# Patient Record
Sex: Female | Born: 1960 | Race: White | Hispanic: No | Marital: Married | Smoking: Current every day smoker
Health system: Southern US, Community
[De-identification: ages and names within clinical notes are randomized; demographics above are authoritative.]

## PROBLEM LIST (undated history)

## (undated) DIAGNOSIS — J302 Other seasonal allergic rhinitis: Secondary | ICD-10-CM

## (undated) DIAGNOSIS — Z860100 Personal history of colon polyps, unspecified: Secondary | ICD-10-CM

## (undated) DIAGNOSIS — C801 Malignant (primary) neoplasm, unspecified: Secondary | ICD-10-CM

## (undated) DIAGNOSIS — T7840XA Allergy, unspecified, initial encounter: Secondary | ICD-10-CM

## (undated) DIAGNOSIS — K589 Irritable bowel syndrome without diarrhea: Secondary | ICD-10-CM

## (undated) DIAGNOSIS — F4010 Social phobia, unspecified: Secondary | ICD-10-CM

## (undated) DIAGNOSIS — M1990 Unspecified osteoarthritis, unspecified site: Secondary | ICD-10-CM

## (undated) DIAGNOSIS — G43909 Migraine, unspecified, not intractable, without status migrainosus: Secondary | ICD-10-CM

## (undated) DIAGNOSIS — N959 Unspecified menopausal and perimenopausal disorder: Secondary | ICD-10-CM

## (undated) HISTORY — DX: Unspecified menopausal and perimenopausal disorder: N95.9

## (undated) HISTORY — DX: Unspecified osteoarthritis, unspecified site: M19.90

## (undated) HISTORY — PX: COSMETIC SURGERY: SHX468

## (undated) HISTORY — DX: Migraine, unspecified, not intractable, without status migrainosus: G43.909

## (undated) HISTORY — DX: Personal history of colonic polyps: Z86.010

## (undated) HISTORY — PX: BREAST SURGERY: SHX581

## (undated) HISTORY — DX: Social phobia, unspecified: F40.10

## (undated) HISTORY — DX: Allergy, unspecified, initial encounter: T78.40XA

## (undated) HISTORY — DX: Personal history of colon polyps, unspecified: Z86.0100

## (undated) HISTORY — PX: HEMORROIDECTOMY: SUR656

## (undated) HISTORY — PX: TOTAL ABDOMINAL HYSTERECTOMY: SHX209

---

## 1997-06-28 ENCOUNTER — Other Ambulatory Visit: Admission: RE | Admit: 1997-06-28 | Discharge: 1997-06-28 | Payer: Self-pay | Admitting: Obstetrics and Gynecology

## 1998-04-21 ENCOUNTER — Ambulatory Visit (HOSPITAL_COMMUNITY): Admission: RE | Admit: 1998-04-21 | Discharge: 1998-04-21 | Payer: Self-pay | Admitting: Gastroenterology

## 1999-02-28 ENCOUNTER — Other Ambulatory Visit: Admission: RE | Admit: 1999-02-28 | Discharge: 1999-02-28 | Payer: Self-pay | Admitting: *Deleted

## 1999-11-22 ENCOUNTER — Encounter: Payer: Self-pay | Admitting: *Deleted

## 1999-11-22 ENCOUNTER — Encounter: Admission: RE | Admit: 1999-11-22 | Discharge: 1999-11-22 | Payer: Self-pay | Admitting: *Deleted

## 2000-02-29 ENCOUNTER — Other Ambulatory Visit: Admission: RE | Admit: 2000-02-29 | Discharge: 2000-02-29 | Payer: Self-pay | Admitting: *Deleted

## 2001-04-01 ENCOUNTER — Other Ambulatory Visit: Admission: RE | Admit: 2001-04-01 | Discharge: 2001-04-01 | Payer: Self-pay | Admitting: *Deleted

## 2002-04-29 ENCOUNTER — Other Ambulatory Visit: Admission: RE | Admit: 2002-04-29 | Discharge: 2002-04-29 | Payer: Self-pay | Admitting: Obstetrics and Gynecology

## 2002-06-18 ENCOUNTER — Emergency Department (HOSPITAL_COMMUNITY): Admission: EM | Admit: 2002-06-18 | Discharge: 2002-06-18 | Payer: Self-pay | Admitting: Emergency Medicine

## 2002-07-06 ENCOUNTER — Emergency Department (HOSPITAL_COMMUNITY): Admission: EM | Admit: 2002-07-06 | Discharge: 2002-07-06 | Payer: Self-pay | Admitting: Emergency Medicine

## 2003-10-05 ENCOUNTER — Other Ambulatory Visit: Admission: RE | Admit: 2003-10-05 | Discharge: 2003-10-05 | Payer: Self-pay | Admitting: *Deleted

## 2004-03-23 ENCOUNTER — Ambulatory Visit (HOSPITAL_COMMUNITY): Admission: RE | Admit: 2004-03-23 | Discharge: 2004-03-23 | Payer: Self-pay | Admitting: General Surgery

## 2004-03-23 ENCOUNTER — Encounter (INDEPENDENT_AMBULATORY_CARE_PROVIDER_SITE_OTHER): Payer: Self-pay | Admitting: Specialist

## 2004-09-18 ENCOUNTER — Ambulatory Visit (HOSPITAL_COMMUNITY): Admission: RE | Admit: 2004-09-18 | Discharge: 2004-09-18 | Payer: Self-pay | Admitting: Obstetrics & Gynecology

## 2004-10-17 ENCOUNTER — Inpatient Hospital Stay (HOSPITAL_COMMUNITY): Admission: RE | Admit: 2004-10-17 | Discharge: 2004-10-21 | Payer: Self-pay | Admitting: Obstetrics & Gynecology

## 2004-10-17 ENCOUNTER — Encounter (INDEPENDENT_AMBULATORY_CARE_PROVIDER_SITE_OTHER): Payer: Self-pay | Admitting: Specialist

## 2005-01-21 HISTORY — PX: LAPAROSCOPIC CHOLECYSTECTOMY: SUR755

## 2005-08-20 ENCOUNTER — Encounter: Admission: RE | Admit: 2005-08-20 | Discharge: 2005-08-20 | Payer: Self-pay | Admitting: Gastroenterology

## 2005-09-27 ENCOUNTER — Encounter: Admission: RE | Admit: 2005-09-27 | Discharge: 2005-09-27 | Payer: Self-pay | Admitting: Gastroenterology

## 2005-11-11 ENCOUNTER — Encounter (INDEPENDENT_AMBULATORY_CARE_PROVIDER_SITE_OTHER): Payer: Self-pay | Admitting: Specialist

## 2005-11-11 ENCOUNTER — Ambulatory Visit (HOSPITAL_COMMUNITY): Admission: RE | Admit: 2005-11-11 | Discharge: 2005-11-11 | Payer: Self-pay | Admitting: General Surgery

## 2006-08-26 ENCOUNTER — Ambulatory Visit (HOSPITAL_COMMUNITY): Admission: RE | Admit: 2006-08-26 | Discharge: 2006-08-26 | Payer: Self-pay | Admitting: Obstetrics & Gynecology

## 2007-03-31 ENCOUNTER — Encounter: Admission: RE | Admit: 2007-03-31 | Discharge: 2007-03-31 | Payer: Self-pay | Admitting: Family Medicine

## 2007-12-25 ENCOUNTER — Ambulatory Visit (HOSPITAL_COMMUNITY): Admission: RE | Admit: 2007-12-25 | Discharge: 2007-12-25 | Payer: Self-pay | Admitting: Obstetrics & Gynecology

## 2010-02-11 ENCOUNTER — Encounter: Payer: Self-pay | Admitting: Obstetrics & Gynecology

## 2010-06-08 NOTE — Op Note (Signed)
NAMEDELANA, MANGANELLO              ACCOUNT NO.:  [id number]   MEDICAL RECORD NO.:  [id number]          PATIENT TYPE:  AMB   LOCATION:  DAY                          FACILITY:  [facility]   PHYSICIAN:  Timothy E. Earlene Plater, M.D. DATE OF BIRTH:  May 25, 1960   DATE OF PROCEDURE:  03/23/2004  DATE OF DISCHARGE:                                 OPERATIVE REPORT   PREOPERATIVE DIAGNOSIS:  Prolapsing hemorrhoids with bleeding.   POSTOPERATIVE DIAGNOSIS:  Prolapsing hemorrhoids with bleeding.   PROCEDURE:  Procedure for prolapsing hemorrhoids (PPH) hemorrhoidectomy.   SURGEON:  Timothy E. Earlene Plater, M.D.   ANESTHESIA:  General.   Ms. Waid is 26, otherwise healthy.  After three vaginal deliveries, the  patient has developed continuously problematic hemorrhoids with prolapse and  bleeding.  She was seen and evaluated in the office.  She elects to proceed  with PPH hemorrhoidectomy.  This has been carefully explained.  She was  seen, identified, and the permit signed.   She was taken to the operating room, placed supine.  General endotracheal  anesthesia administered.  She was carefully placed in lithotomy.  Perianal  area was inspected and prepped and draped in the usual fashion.  The  external and the anal orifice were normal in appearance.  The sphincter tone  was satisfactory.  The anus was injected round about for wide epithelial  block with 0.5% Marcaine with epinephrine mixed 25:1 with Wydase.  This  allowed for gentle dilatation as well.  The operating anoscope was placed in  the rectal vault, and the area was inspected.  Hemorrhoids were her only  pathology.   A purse-string suture of 2-0 Prolene was placed at approximately 4-5 cm  proximal to the dentate line, and this was carried out in a circumferential  fashion in the rectal mucosa only.  The scope was removed.  This area was  checked with the examining finger.  It was thought to be good.  A clear  anoscope of the Ethicon kit was placed  into the anal orifice.  The strings  were followed through the center, and again, this purse-string suture was  checked.  Then, with the clear anoscope in place and firmly held, the open  stapling device was introduced into the rectal vault with the head clearly  above the purse-string suture.  The purse-string suture was then tied about  the post.  The strings brought through the body of the stapling device.  Then with the stapler in the proper position and alignment with the proper  traction, the stapler was carefully closed, held for 60 seconds, fired, held  for 60 seconds, and then removed.  The tissue specimen was a complete  circular round of mucosal tissue.  This was sent to pathology.  Meanwhile, a  gauze was placed in the rectal canal and checked for bleeding.  There was  only one spot bleeding, and that was a right anterior hemorrhoidal complex  distal to the suture line.  The suture line was perfectly dry.  This was  checked and rechecked three times over 15 minutes, and there was no bleeding  from the suture line.  I elected to place a suture of 4-0 Vicryl in the  hemorrhoidal complex that was bleeding in the right anterior position.  All  bleeding stopped.  The  wounds were dry.  Gelfoam gauze and dry sterile dressing applied.  All  counts correct.  She tolerated it well.  Was removed to the recovery room in  good condition. Written and verbal instructions were given to her and her  family.  She will be seen and followed as an outpatient.  Percocet 5 mg #36.      TED/MEDQ  D:  03/23/2004  T:  03/23/2004  Job:  [id number]   cc:   Andres Ege, M.D.  [address]  [place]  [place] [address]  Fax: [phone]

## 2010-06-08 NOTE — Op Note (Signed)
Shelly Green, Shelly Green              ACCOUNT NO.:  [id number]   MEDICAL RECORD NO.:  [id number]          PATIENT TYPE:  INP   LOCATION:  9308                          FACILITY:  [facility]   PHYSICIAN:  Roseanna Rainbow, M.D.DATE OF BIRTH:  [birth year]   DATE OF PROCEDURE:  10/17/2004  DATE OF DISCHARGE:                                 OPERATIVE REPORT   PREOPERATIVE DIAGNOSES:  1.  Mild uterine prolapse.  2.  Retroverted uterus.  3.  Dyspareunia.  4.  Dysmenorrhea.   POSTOPERATIVE DIAGNOSES:  1.  Mild uterine prolapse.  2.  Retroverted uterus.  3.  Dyspareunia.  4.  Dysmenorrhea.  5.  Endometriosis.   PROCEDURES:  1.  Diagnostic laparoscopy.  2.  Total abdominal hysterectomy.  3.  Excision of endometriotic peritoneal implant.   SURGEONS:  Roseanna Rainbow, M.D., Bing Neighbors. Clearance Coots, M.D.   ANESTHESIA:  General endotracheal.   COMPLICATIONS:  None.   ESTIMATED BLOOD LOSS:  100 mL.   URINE OUTPUT AND IV FLUID:  As per anesthesiology.   FINDINGS AT LAPAROSCOPY:  There were and bilateral hemorrhagic implants  involving the uterosacral ligament.  There were also adhesions involving the  sigmoid colon to the broad ligament.  There was otherwise no evidence of any  other disease.  The appendix was normal-appearing.  The remainder of the  peritoneal surfaces were normal.  There were minimal adhesions involving the  bladder and the lower uterine segment and cervix.   PROCEDURE:  The patient was taken to the operating room, where general  anesthesia was obtained without difficulty.  She was placed in the dorsal lithotomy position and prepped and draped in  the usual sterile fashion.  A bivalve speculum was then placed in the  patient's vagina and the anterior lip of the of the cervix grasped with a  single-tooth tenaculum.  The Hulka manipulator was then advanced into the  uterus and secured to the anterior lip of the cervix as a means to  manipulate the uterus.  The  speculum and tenaculum were then removed.  Attention was then turned to the patient's abdomen, where a 10 mm skin  incision was made in the umbilical fold.  The fascia was grasped with Kocher  clamps and entered sharply.  The parietal peritoneum was entered sharply as  well.  The Hassan trocar and sleeve were then advanced into the abdomen.  Pneumoperitoneum was obtained with 4 liters of CO2 gas.  A survey of the  patient's pelvis revealed the above findings.  At this point the instruments  were removed from the abdomen.  The incision where the fascial incision was  reapproximated with 0 Vicryl.  The skin was reapproximated with 3-0  Monocryl.  A Pfannenstiel skin incision was then made approximately 2 cm  above the symphysis pubis and extended to the rectus fascia.  The fascia was  incised bilaterally with curved Mayo scissors.  The muscles of the anterior  abdominal wall were then separated in the midline.  The parietal peritoneum  was grasped between two pickups, elevated and entered sharply with the  scalpel.  The pelvis was examined again with the findings noted above.  An Thresa Ross-  O'Sullivan retractor was placed into the incision and the bowel packed away  with moistened laparotomy sponges.  Two long Kellys were placed on the cornu  and used for retraction.  The round ligaments on both sides were transected  with cautery.  The anterior leaf of the broad ligament was incised along the  bladder reflection to the midline from both sides.  The bladder was  dissected off the lower uterine segment and cervix sharply using Metzenbaum  scissors.  The utero-ovarian ligament and fallopian tubes on both sides were  doubly clamped, transected and both free ligatures and suture ligatures were  placed with 0 Vicryl.  Hemostasis was visualized.  The uterine arteries were clamped bilaterally  with parametrial clamps, transected and suture ligated with 0 Vicryl.  Again  hemostasis was assured.  The  uterosacral ligaments were clamped on both  sides, transected and suture ligated in a similar fashion.  The cervix was  then amputated.  The vaginal cuff angles had been secured with suture  ligatures of 0 Vicryl.  The remainder of the cup was closed with interrupted  0 Vicryl figure-of-eight sutures.  Hemostasis was assured.  At this point  the endometriotic lesion described on the right side was grasped and the  surrounding parietal peritoneum was circumscribed with cautery and the  lesion was excised.  Again hemostasis was assured.  The adnexal pedicles were then plicated to the round ligament using 0 Vicryl  bilaterally.  The pelvis was copiously irrigated with warm normal saline.  All laparotomy sponges and instruments were removed from the abdomen.  The  parietal peritoneum was closed with running 2-0 Monocryl.  Hemostasis was  assured.  The fascia was closed with running 0 Vicryl.  The skin was closed  in a subcuticular fashion using 3-0 Monocryl.  Sponge, lap, needle and  instrument counts were correct x2.  The patient was taken to the PACU awake  and in stable condition.      Roseanna Rainbow, M.D.  Electronically Signed     LAJ/MEDQ  D:  10/17/2004  T:  10/17/2004  Job:  [id number]

## 2010-06-08 NOTE — H&P (Signed)
Shelly Green, Shelly Green              ACCOUNT NO.:  [id number]   MEDICAL RECORD NO.:  [id number]          PATIENT TYPE:  INP   LOCATION:  NA                            FACILITY:  [facility]   PHYSICIAN:  Roseanna Rainbow, M.D.DATE OF BIRTH:  Dec 09, 1960   DATE OF ADMISSION:  DATE OF DISCHARGE:                                HISTORY & PHYSICAL   CHIEF COMPLAINT:  The patient is a 50 year old with a retroverted uterus  with mild uterine prolapse and secondary dyspareunia.   HISTORY OF PRESENT ILLNESS:  Please see the above. The patient has a long  history of dyspareunia. The pain is now worsened and unresponsive to  position changes during intercourse.  A recent Hgb was 13.8.   PAST MEDICAL HISTORY:  1.  Irritable bowel syndrome.  2.  Bipolar disorder.   PAST SURGICAL HISTORY:  Hemorrhoidectomy.   SOCIAL HISTORY:  She is married, living with her spouse. Currently smokes.  Does not give any significant history of alcohol usage. Denies illicit drug  use.   PAST GYNECOLOGICAL HISTORY:  Menstrual periods are described as heavy. There  is a 28-day interval between cycles, duration of 4 days. The flow is  described as heavy. Has had moderate dysmenorrhea. Last Pap smear within  normal limits. Contraception:  The partner has had a vasectomy. She has a  remote history of pelvic inflammatory disease. No previous GYN surgery.   PAST OBSTETRICAL HISTORY:  She has three living children.   ALLERGIES:  CODEINE and PENICILLINS   MEDICATIONS:  Alprazolam, Abilify, Cymbalta, butalbital compound/ASA oral  capsule.   PHYSICAL EXAMINATION:  VITAL SIGNS:  Temperature 97.9, pulse 76, blood  pressure 98/59, weight 121 pounds, height 5 feet 3 inches.  GENERAL/CONSTITUTIONAL:  Caucasian female, appears stated age, normal body  habitus.  NECK:  Supple.  LUNGS:  Clear to auscultation bilaterally.  HEART:  Regular rate and rhythm.  ABDOMEN:  Soft, nontender, no organomegaly.  PELVIC:  Normal EG/BUS. On  speculum exam, the vagina is clean. On bimanual  exam, the uterus is normal size. The position is retroverted, no tenderness,  and there is mild uterine prolapse. On adnexal exam, there is no masses,  organomegaly, or local guarding.   ASSESSMENT:  Mild uterine prolapse, retroverted uterus with secondary  dysmenorrhea.   PLAN:  The planned procedure is a diagnostic laparoscopy with a total  vaginal hysterectomy, possible total abdominal hysterectomy. The risks,  benefits, and alternative forms of management were reviewed with the patient  and informed consent has been obtained.   MEDICATIONS:  Alprazolam, Abilify, Cymbalta, butalbital compound/ASA oral  capsule.   ALLERGIES:   ADDENDUM TO HISTORY OF PRESENT ILLNESS:      Roseanna Rainbow, M.D.  Electronically Signed     LAJ/MEDQ  D:  10/16/2004  T:  10/16/2004  Job:  [id number]

## 2010-06-08 NOTE — Op Note (Signed)
Shelly Green, Shelly Green              ACCOUNT NO.:  [id number]   MEDICAL RECORD NO.:  [id number]          PATIENT TYPE:  AMB   LOCATION:  DAY                          FACILITY:  [facility]   PHYSICIAN:  Timothy E. Earlene Plater, M.D. DATE OF BIRTH:  [birth year]   DATE OF PROCEDURE:  11/11/2005  DATE OF DISCHARGE:                                 OPERATIVE REPORT   PREOPERATIVE DIAGNOSIS:  Biliary dyskinesia.   POSTOPERATIVE DIAGNOSIS:  Biliary dyskinesia.   PROCEDURE:  Laparoscopic cholecystectomy and operative cholangiogram.   SURGEON:  Timothy E. Earlene Plater, M.D.   ASSISTANT:  Velora Heckler, MD   ANESTHESIA:  General.   Ms. Goonan is 43 healthy, has been seen and followed recently by Dr. Charna Elizabeth and Dr. Gweneth Dimitri for gastrointestinal complaints compatible with  gallbladder disease.  A complete workup has revealed a normal ultrasound,  normal CT, relatively normal endoscopy and an abnormal hepatobiliary scan.  Because of the patient's worsening symptoms.  She has sought surgical  consultation and wishes to proceed with cholecystectomy as has been  carefully explained.   She was seen, evaluated today and cleared for anesthesia.  She was  identified and the permit signed.   The patient was taken to the operating room and placed supine.  General  endotracheal anesthesia administered.  The abdomen was prepped and draped in  the usual fashion.  The infraumbilical prior incision horizontal was used to  enter the skin after local anesthesia applied.  The fascia identified.  There was a question of a tiny umbilical hernia but on opening the fascia,  there was no hernia present.  The peritoneum was entered without  complication.  A suture of #1 Vicryl placed, a Hasson catheter placed  through the fascia into the peritoneal cavity and tied in place with Vicryl  suture.  The abdomen was insufflated.  General peritoneoscopy was  unremarkable.  The gallbladder was large, normal color.  It was  floppy.  The  body of the gallbladder flopped acutely over the neck of the gallbladder.  There were adhesions of an acute nature between the gallbladder and the  omentum.  A second 10 mL trocar placed in the midepigastrium two 5 mm  trocars in the right upper quadrant.  The gallbladder was grasped, placed on  tension and careful and accurate dissection revealed a normal-appearing  cystic duct and cystic artery.  These were isolated out completely a full  window made posteriorly, no other structures present.  The clip was placed  on the gallbladder side of the cystic duct and cystic duct opened and the  catheter placed percutaneously into the cystic duct remnant.  A real-time  cholangiogram was accomplished with the aid of fluoroscopy and appeared  completely normal with smooth filling of the biliary system and a quick  emptying of dye into the duodenum.  With this the clip and catheter were  removed.  The stump of the cystic duct triply clipped and divided.  The  cystic artery triply clipped and divided and the gallbladder then removed  from the gallbladder bed without incident or complication.  Cautery was used  where necessary.  Copious irrigation was used and the gallbladder bed was  dry.  A piece of Surgicel was placed in the gallbladder bed.   The scope was placed in the epigastric trocar and the gallbladder removed  through the infraumbilical incision without complications.  The incision  closed completely with the previously placed Vicryl suture.  All areas were  examined and there was no bleeding.  All irrigant had been removed.  And  then under direct vision the CO2 trocars and instruments are removed.  Final  counts correct.  Each skin incision carefully inspected and irrigated and  then closed with 3-0 Monocryl and Steri-Strips.  She tolerated it well, was  awake and taken recovery room in good condition.      Timothy E. Earlene Plater, M.D.  Electronically Signed     TED/MEDQ   D:  11/11/2005  T:  11/11/2005  Job:  [id number]   cc:   Pam Drown, M.D.  Fax: [phone]   [id number] LOV FIEP, M.D.  Fax: [phone]

## 2010-06-08 NOTE — Discharge Summary (Signed)
Shelly Green, Shelly Green              ACCOUNT NO.:  [id number]   MEDICAL RECORD NO.:  [id number]          PATIENT TYPE:  INP   LOCATION:  9308                          FACILITY:  [facility]   PHYSICIAN:  Charles A. Clearance Coots, M.D.DATE OF BIRTH:  Dec 15, 1960   DATE OF ADMISSION:  10/17/2004  DATE OF DISCHARGE:  10/21/2004                                 DISCHARGE SUMMARY   ADMISSION DIAGNOSES:  Mild uterine prolapse, retroverted uterus,  dyspareunia, dysmenorrhea.   DISCHARGE DIAGNOSES:  Mild uterine prolapse, retroverted uterus,  dyspareunia, dysmenorrhea, endometriosis.   REASON FOR ADMISSION:  50 year old white female with retroverted uterus,  mild uterine prolapse, secondary dyspareunia.  The patient has a long  history of dyspareunia and pain has now worsened and unresponsive to  position changes.  The menstrual periods are described as heavy, 28 day  interval between cycles, duration of 4 days.  Flow is described as heavy and  she has moderate dysmenorrhea.  Last Pap smear was within normal limits.  She has a remote history of pelvic inflammatory disease.   OBSTETRICAL HISTORY:  Her past obstetrical history is significant for three  living children.   PAST MEDICAL HISTORY:  Illnesses include irritable bowel syndrome, bipolar  disorder.   PAST SURGICAL HISTORY:  Hemorrhoidectomy.   MEDICATIONS:  Patient takes alprazolam, Abilify, Cymbalta, butalbital  compound with aspirin.   ALLERGIES:  PENICILLIN and  CODEINE causes throat swelling.   SOCIAL HISTORY:  Married, living with spouse.  Positive tobacco.  No  significant history of alcohol or recreational drug use.   PHYSICAL EXAMINATION:  GENERAL APPEARANCE: Well-nourished, well-developed  female in no acute distress.  VITAL SIGNS:  Temperature 97.9. Pulse 76, blood pressure 98/59. Weight 121  pounds. Height 5 feet 3 inches.  NECK:  Supple.  LUNGS:  Clear to auscultation bilaterally.  HEART:  Regular rate and rhythm.  ABDOMEN:   Soft, nontender, no organomegaly.  PELVIC:  Normal external female genitalia.  Vaginal mucosa is clean.  Bimanual examination revealed the uterus to be normal size, retroverted, no  tenderness, mild uterine prolapse.  Adnexa negative for masses or  tenderness.   ASSESSMENT:  Mild uterine prolapse with retroverted uterus and secondary  dysmenorrhea.   PLAN:  Diagnostic laparoscopy, vaginal hysterectomy.   HOSPITAL COURSE:  The patient underwent a diagnostic laparoscopy and there  were bilateral hemorrhagic implants involving the uterosacral ligament.  There were also adhesions involving the sigmoid colon to the broad ligament.  The appendix was normal and there was no other disease seen.  There were  minimal adhesions involving the bladder and the lower uterine segment and  cervix.  The decision was made to proceed with total abdominal hysterectomy.  A total abdominal hysterectomy was performed without complications.  Postoperative course was uncomplicated.  The patient had a mild adynamic  ileus which resolved by postoperative day #3 and the patient was able to  tolerate solid foods on postoperative day #3 and was discharged home on  postoperative day #4 in good condition.   LABORATORY DATA:  Admitting and discharge laboratory values with admission  hemoglobin 13.1, hematocrit 39.8, white  blood cell count 8,600, platelet  count 192,000.  Discharge labs include hemoglobin 9.9, hematocrit 29.8.   DISPOSITION:   DISCHARGE MEDICATIONS:  Ultram was prescribed for pain.  Continue usual  medications.   FOLLOW UP:  Patient is to follow up in the office in two weeks.   DISCHARGE INSTRUCTIONS:  Routine written discharge instructions were given  for discharge after hysterectomy.      Charles A. Clearance Coots, M.D.  Electronically Signed     CAH/MEDQ  D:  10/22/2004  T:  10/22/2004  Job:  [id number]

## 2011-08-15 ENCOUNTER — Other Ambulatory Visit: Payer: Self-pay | Admitting: Orthopedic Surgery

## 2011-08-19 ENCOUNTER — Encounter (HOSPITAL_BASED_OUTPATIENT_CLINIC_OR_DEPARTMENT_OTHER): Payer: Self-pay | Admitting: *Deleted

## 2011-08-19 NOTE — H&P (Signed)
  Shelly Green is an 51 y.o. female.   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  She has been evaluated at [facility] and sports medicine for evaluation of hand symptoms.  She had tendonitis symptoms at her first dorsal compartment on her right. She had an injection performed by Hurshel Keys in the fall of 2012.  She improved after splinting.  She subsequently developed numbness in her median innervated fingers, right worse than left.  Pete injected her right ulnar bursa at least one time and had her use night splints.  She had electrodiagnostic studies performed by Dr. Regino Schultze on 02-28-2011.  These revealed evidence of significant right carpal tunnel syndrome.  The left hand was not tested at that time.     Past Medical History  Diagnosis Date  . IBS (irritable bowel syndrome)   . Seasonal allergies   . Bipolar 1 disorder     Past Surgical History  Procedure Date  . Laparoscopic cholecystectomy 2007  . Total abdominal hysterectomy   . Hemorroidectomy     No family history on file. Social History:  reports that she has been smoking.  She does not have any smokeless tobacco history on file. She reports that she does not drink alcohol. Her drug history not on file.  Allergies:  Allergies  Allergen Reactions  . Codeine Anaphylaxis  . Hydrocodone Shortness Of Breath  . Penicillins Anaphylaxis  . Tramadol Shortness Of Breath    No prescriptions prior to admission    No results found for this or any previous visit (from the past 48 hour(s)).  No results found.   Pertinent items are noted in HPI.  Height 5\' 2"  (1.575 m), weight 57.153 kg (126 lb).  General appearance: alert Head: Normocephalic, without obvious abnormality Neck: supple, symmetrical, trachea midline Resp: clear to auscultation bilaterally Cardio: regular rate and rhythm GI: normal findings: bowel  sounds normal Extremities.  Inspection of her hands reveals normal sweat patterns and dermatoglyphics. She has no thenar atrophy. She has positive wrist flexion test on the right and positive Tinel's sign on the right. She has positive direct compression test bilaterally.  She has no sign of stenosing tenosynovitis of her fingers or thumb, but does have a mildly painful Finkelstein maneuver on the right.  She does not show signs of thumb CMC arthrosis.   Plain x-rays AP and lateral including the carpus of her right and left hands are obtained.  She has no significant osteoarthritis. Her carpus is normal in appearance bilaterally.  Pulses: 2+ and symmetric Skin: normal Neurologic: Grossly normal    Assessment/Plan Impression:Right CTS  Plan: To the OR for right CTR.The procedure, risks,benefits and post-op course were discussed with the patient at length and they were in agreement with the plan. .  If, at the time of surgery, she has significant stenosing tenosynovitis symptoms at the first dorsal compartment we will also release the right first dorsal compartment.     Shelly Green 08/19/2011, 9:55 PM      H&P documentation: 08/20/2011  -History and Physical Reviewed  -Patient has been re-examined  -No change in the plan of care  Shelly Forster, MD

## 2011-08-19 NOTE — Progress Notes (Signed)
No labs needed

## 2011-08-20 ENCOUNTER — Encounter (HOSPITAL_BASED_OUTPATIENT_CLINIC_OR_DEPARTMENT_OTHER): Payer: Self-pay | Admitting: *Deleted

## 2011-08-20 ENCOUNTER — Encounter (HOSPITAL_BASED_OUTPATIENT_CLINIC_OR_DEPARTMENT_OTHER): Payer: Self-pay | Admitting: Anesthesiology

## 2011-08-20 ENCOUNTER — Ambulatory Visit (HOSPITAL_BASED_OUTPATIENT_CLINIC_OR_DEPARTMENT_OTHER)
Admission: RE | Admit: 2011-08-20 | Discharge: 2011-08-20 | Disposition: A | Discharge status: Home / Self Care | Payer: BC Managed Care – PPO | Source: Ambulatory Visit | Attending: Orthopedic Surgery | Admitting: Orthopedic Surgery

## 2011-08-20 ENCOUNTER — Encounter (HOSPITAL_BASED_OUTPATIENT_CLINIC_OR_DEPARTMENT_OTHER)
Admission: RE | Disposition: A | Discharge status: Home / Self Care | Payer: Self-pay | Source: Ambulatory Visit | Attending: Orthopedic Surgery

## 2011-08-20 ENCOUNTER — Ambulatory Visit (HOSPITAL_BASED_OUTPATIENT_CLINIC_OR_DEPARTMENT_OTHER): Payer: BC Managed Care – PPO | Admitting: Anesthesiology

## 2011-08-20 DIAGNOSIS — G5600 Carpal tunnel syndrome, unspecified upper limb: Secondary | ICD-10-CM | POA: Insufficient documentation

## 2011-08-20 HISTORY — DX: Irritable bowel syndrome, unspecified: K58.9

## 2011-08-20 HISTORY — DX: Other seasonal allergic rhinitis: J30.2

## 2011-08-20 HISTORY — PX: CARPAL TUNNEL RELEASE: SHX101

## 2011-08-20 SURGERY — CARPAL TUNNEL RELEASE
Anesthesia: General | Site: Hand | Laterality: Right | Wound class: Clean

## 2011-08-20 MED ORDER — PROPOFOL 10 MG/ML IV EMUL
INTRAVENOUS | Status: DC | PRN
  Administered 2011-08-20: 150 mg via INTRAVENOUS

## 2011-08-20 MED ORDER — DEXAMETHASONE SODIUM PHOSPHATE 10 MG/ML IJ SOLN
INTRAMUSCULAR | Status: DC | PRN
  Administered 2011-08-20: 10 mg via INTRAVENOUS

## 2011-08-20 MED ORDER — ONDANSETRON HCL 4 MG/2ML IJ SOLN
INTRAMUSCULAR | Status: DC | PRN
  Administered 2011-08-20: 4 mg via INTRAVENOUS

## 2011-08-20 MED ORDER — MIDAZOLAM HCL 5 MG/5ML IJ SOLN
INTRAMUSCULAR | Status: DC | PRN
  Administered 2011-08-20: 2 mg via INTRAVENOUS

## 2011-08-20 MED ORDER — LACTATED RINGERS IV SOLN
INTRAVENOUS | Status: DC
  Administered 2011-08-20: 10 mL/h via INTRAVENOUS
  Administered 2011-08-20: 09:00:00 via INTRAVENOUS

## 2011-08-20 MED ORDER — ONDANSETRON HCL 4 MG/2ML IJ SOLN: 4.0000 mg | Freq: Once | INTRAMUSCULAR | Status: DC | PRN

## 2011-08-20 MED ORDER — HYDROMORPHONE HCL 2 MG PO TABS: ORAL_TABLET | ORAL | Status: AC

## 2011-08-20 MED ORDER — HYDROMORPHONE HCL PF 1 MG/ML IJ SOLN
0.2500 mg | INTRAMUSCULAR | Status: DC | PRN
  Administered 2011-08-20: 0.5 mg via INTRAVENOUS

## 2011-08-20 MED ORDER — LIDOCAINE HCL (CARDIAC) 20 MG/ML IV SOLN
INTRAVENOUS | Status: DC | PRN
  Administered 2011-08-20: 100 mg via INTRAVENOUS

## 2011-08-20 MED ORDER — CHLORHEXIDINE GLUCONATE 4 % EX LIQD: 60.0000 mL | Freq: Once | CUTANEOUS | Status: DC

## 2011-08-20 MED ORDER — FENTANYL CITRATE 0.05 MG/ML IJ SOLN
INTRAMUSCULAR | Status: DC | PRN
  Administered 2011-08-20: 100 ug via INTRAVENOUS

## 2011-08-20 MED ORDER — LIDOCAINE HCL 2 % IJ SOLN
INTRAMUSCULAR | Status: DC | PRN
  Administered 2011-08-20: 4 mL

## 2011-08-20 SURGICAL SUPPLY — 38 items
BANDAGE ADHESIVE 1X3 (GAUZE/BANDAGES/DRESSINGS) IMPLANT
BANDAGE ELASTIC 3 VELCRO ST LF (GAUZE/BANDAGES/DRESSINGS) ×2 IMPLANT
BLADE SURG 15 STRL LF DISP TIS (BLADE) ×1 IMPLANT
BLADE SURG 15 STRL SS (BLADE) ×1
BNDG ESMARK 4X9 LF (GAUZE/BANDAGES/DRESSINGS) IMPLANT
BRUSH SCRUB EZ PLAIN DRY (MISCELLANEOUS) ×2 IMPLANT
CLOTH BEACON ORANGE TIMEOUT ST (SAFETY) ×2 IMPLANT
CORDS BIPOLAR (ELECTRODE) IMPLANT
COVER MAYO STAND STRL (DRAPES) ×2 IMPLANT
COVER TABLE BACK 60X90 (DRAPES) ×2 IMPLANT
CUFF TOURNIQUET SINGLE 18IN (TOURNIQUET CUFF) IMPLANT
DECANTER SPIKE VIAL GLASS SM (MISCELLANEOUS) IMPLANT
DRAPE EXTREMITY T 121X128X90 (DRAPE) ×2 IMPLANT
DRAPE SURG 17X23 STRL (DRAPES) ×2 IMPLANT
GLOVE BIO SURGEON STRL SZ 6.5 (GLOVE) ×4 IMPLANT
GLOVE BIOGEL M STRL SZ7.5 (GLOVE) ×2 IMPLANT
GLOVE BIOGEL PI IND STRL 6.5 (GLOVE) ×1 IMPLANT
GLOVE BIOGEL PI INDICATOR 6.5 (GLOVE) ×1
GLOVE ORTHO TXT STRL SZ7.5 (GLOVE) ×2 IMPLANT
GOWN PREVENTION PLUS XLARGE (GOWN DISPOSABLE) ×6 IMPLANT
GOWN PREVENTION PLUS XXLARGE (GOWN DISPOSABLE) ×4 IMPLANT
NEEDLE 27GAX1X1/2 (NEEDLE) IMPLANT
PACK BASIN DAY SURGERY FS (CUSTOM PROCEDURE TRAY) ×2 IMPLANT
PAD CAST 3X4 CTTN HI CHSV (CAST SUPPLIES) ×1 IMPLANT
PADDING CAST ABS 4INX4YD NS (CAST SUPPLIES) ×1
PADDING CAST ABS COTTON 4X4 ST (CAST SUPPLIES) ×1 IMPLANT
PADDING CAST COTTON 3X4 STRL (CAST SUPPLIES) ×1
SPLINT PLASTER CAST XFAST 3X15 (CAST SUPPLIES) ×5 IMPLANT
SPLINT PLASTER XTRA FASTSET 3X (CAST SUPPLIES) ×5
SPONGE GAUZE 4X4 12PLY (GAUZE/BANDAGES/DRESSINGS) ×2 IMPLANT
STOCKINETTE 4X48 STRL (DRAPES) ×2 IMPLANT
STRIP CLOSURE SKIN 1/2X4 (GAUZE/BANDAGES/DRESSINGS) ×2 IMPLANT
SUT PROLENE 3 0 PS 2 (SUTURE) ×2 IMPLANT
SYR 3ML 23GX1 SAFETY (SYRINGE) IMPLANT
SYR CONTROL 10ML LL (SYRINGE) IMPLANT
TRAY DSU PREP LF (CUSTOM PROCEDURE TRAY) ×2 IMPLANT
UNDERPAD 30X30 INCONTINENT (UNDERPADS AND DIAPERS) ×2 IMPLANT
WATER STERILE IRR 1000ML POUR (IV SOLUTION) ×2 IMPLANT

## 2011-08-20 NOTE — Anesthesia Procedure Notes (Signed)
Procedure Name: LMA Insertion Date/Time: 08/20/2011 9:46 AM Performed by: Burna Cash Pre-anesthesia Checklist: Patient identified, Emergency Drugs available, Suction available and Patient being monitored Patient Re-evaluated:Patient Re-evaluated prior to inductionOxygen Delivery Method: Circle System Utilized Preoxygenation: Pre-oxygenation with 100% oxygen Intubation Type: IV induction Ventilation: Mask ventilation without difficulty LMA: LMA inserted LMA Size: 4.0 Number of attempts: 1 Airway Equipment and Method: bite block Placement Confirmation: positive ETCO2 Tube secured with: Tape Dental Injury: Teeth and Oropharynx as per pre-operative assessment

## 2011-08-20 NOTE — Transfer of Care (Signed)
Immediate Anesthesia Transfer of Care Note  Patient: Shelly Green  Procedure(s) Performed: Procedure(s) (LRB): CARPAL TUNNEL RELEASE (Right)  Patient Location: PACU  Anesthesia Type: General  Level of Consciousness: sedated  Airway & Oxygen Therapy: Patient Spontanous Breathing and Patient connected to face mask oxygen  Post-op Assessment: Report given to PACU RN and Post -op Vital signs reviewed and stable  Post vital signs: Reviewed and stable  Complications: No apparent anesthesia complications

## 2011-08-20 NOTE — Anesthesia Postprocedure Evaluation (Signed)
Anesthesia Post Note  Patient: Shelly Green  Procedure(s) Performed: Procedure(s) (LRB): CARPAL TUNNEL RELEASE (Right)  Anesthesia type: general  Patient location: PACU  Post pain: Pain level controlled  Post assessment: Patient's Cardiovascular Status Stable  Last Vitals:  Filed Vitals:   08/20/11 1030  BP: 132/71  Pulse: 57  Temp:   Resp: 13    Post vital signs: Reviewed and stable  Level of consciousness: sedated  Complications: No apparent anesthesia complications  

## 2011-08-20 NOTE — Op Note (Signed)
[id number]  

## 2011-08-20 NOTE — Anesthesia Postprocedure Evaluation (Signed)
Anesthesia Post Note  Patient: Shelly Green  Procedure(s) Performed: Procedure(s) (LRB): CARPAL TUNNEL RELEASE (Right)  Anesthesia type: general  Patient location: PACU  Post pain: Pain level controlled  Post assessment: Patient's Cardiovascular Status Stable  Last Vitals:  Filed Vitals:   08/20/11 1030  BP: 132/71  Pulse: 57  Temp:   Resp: 13    Post vital signs: Reviewed and stable  Level of consciousness: sedated  Complications: No apparent anesthesia complications

## 2011-08-20 NOTE — Anesthesia Preprocedure Evaluation (Signed)
Anesthesia Evaluation  Patient identified by MRN, date of birth, ID band Patient awake    Reviewed: Allergy & Precautions, H&P , NPO status , Patient's Chart, lab work & pertinent test results  Airway Mallampati: I TM Distance: >3 FB Neck ROM: Full    Dental   Pulmonary          Cardiovascular     Neuro/Psych Bipolar Disorder    GI/Hepatic   Endo/Other    Renal/GU      Musculoskeletal   Abdominal   Peds  Hematology   Anesthesia Other Findings   Reproductive/Obstetrics                           Anesthesia Physical Anesthesia Plan  ASA: II  Anesthesia Plan: General   Post-op Pain Management:    Induction: Intravenous  Airway Management Planned: LMA  Additional Equipment:   Intra-op Plan:   Post-operative Plan: Extubation in OR  Informed Consent: I have reviewed the patients History and Physical, chart, labs and discussed the procedure including the risks, benefits and alternatives for the proposed anesthesia with the patient or authorized representative who has indicated his/her understanding and acceptance.     Plan Discussed with: CRNA and Surgeon  Anesthesia Plan Comments:         Anesthesia Quick Evaluation

## 2011-08-20 NOTE — Brief Op Note (Signed)
08/20/2011  10:10 AM  PATIENT:  Shelly Green  51 y.o. female  PRE-OPERATIVE DIAGNOSIS:  right carpal tunnel syndrome  POST-OPERATIVE DIAGNOSIS:  right carpal tunnel syndrome  PROCEDURE:  Procedure(s) (LRB): CARPAL TUNNEL RELEASE (Right)  SURGEON:  Surgeon(s) and Role:    * Wyn Forster., MD - Primary  PHYSICIAN ASSISTANT:   ASSISTANTS: Mallory Shirk.A-C   ANESTHESIA:   General by LMA  EBL:  Total I/O In: 100 [I.V.:100] Out: -   BLOOD ADMINISTERED:none  DRAINS: none   LOCAL MEDICATIONS USED:  XYLOCAINE   SPECIMEN:  No Specimen  DISPOSITION OF SPECIMEN:  N/A  COUNTS:  YES  TOURNIQUET:   Total Tourniquet Time Documented: Upper Arm (Right) - 7 minutes  DICTATION: .Other Dictation: Dictation Number [id number]  PLAN OF CARE: Discharge to home after PACU  PATIENT DISPOSITION:  PACU - hemodynamically stable.

## 2011-08-21 ENCOUNTER — Encounter (HOSPITAL_BASED_OUTPATIENT_CLINIC_OR_DEPARTMENT_OTHER): Payer: Self-pay | Admitting: Orthopedic Surgery

## 2011-08-21 NOTE — Op Note (Signed)
NAMEKELSY, Shelly Green              ACCOUNT NO.:  [id number]  MEDICAL RECORD NO.:  [id number]  LOCATION:                                 FACILITY:  PHYSICIAN:  Katy Fitch. Burma Ketcher, M.D.      DATE OF BIRTH:  DATE OF PROCEDURE:  08/20/2011 DATE OF DISCHARGE:                              OPERATIVE REPORT   PREOPERATIVE DIAGNOSIS:  Entrapped neuropathy, median nerve, right carpal tunnel.  POSTOPERATIVE DIAGNOSIS:  Entrapped neuropathy, median nerve, right carpal tunnel.  OPERATION:  Release of right transverse carpal ligament.  OPERATING SURGEON:  Katy Fitch. Khadijah Mastrianni, MD  ASSISTANT:  Marveen Reeks. Dasnoit, PA-C.  ANESTHESIA:  General by LMA.  SUPERVISING ANESTHESIOLOGIST:  Kaylyn Layer. Michelle Piper, M.D.  INDICATIONS:  Shelly Green is a 51 year old woman referred through the courtesy of Dr. Gweneth Dimitri, for evaluation and management of significant bilateral hand numbness.  Clinical examination suggested probable carpal tunnel syndrome.  Prior electrodiagnostic studies completed by Dr. Regino Schultze of [facility] demonstrated bilateral carpal tunnel syndrome.  Due to failure to respond to nonoperative measures including splinting, activity, modification, and injection, she was brought to the operating room at this time for release of her right transverse carpal ligament.  PROCEDURE IN DETAIL:  Shelly Green was brought to room #2 of the [facility] and placed supine position on the operating table.  Following a detailed anesthesia informed consent by Dr. Michelle Piper, general anesthesia by LMA technique was recommended and accepted.  In room 2, under Dr. Deirdre Priest direct supervision, general anesthesia by LMA technique was induced followed by routine Betadine scrub and paint of the right upper extremity.  Following exsanguination right arm with Esmarch bandage, arterial tourniquet was inflated to 250 mmHg due to mild systolic hypertension.  Following routine surgical  time-out, the procedure commenced with short incision in the line of the ring finger in the palm.  Subcutaneous tissues were carefully divided revealing palmar fascia.  This split longitudinally to reveal the common sensory branch of the median nerve.  These were followed back to the transcarpal ligament, was gently isolated from the median nerve with a Insurance risk surveyor.  The transcarpal ligament was then released along its ulnar border extending into the distal forearm.  This widely opened carpal canal.  No mass or other predicaments were noted.  The bleeding points along the margin of the released ligament were not problematic.  The volar forearm fascia was released subcutaneously.  This widely opened the carpal canal and the distal forearm space overlying peroneus space.  There were no mass or predicaments noted.  The wound was then repaired with intradermal 3-0 Prolene suture.  A compressive dressing was applied with a volar plaster splint maintaining the wrist in 10 degrees of dorsiflexion.     Katy Fitch Cristhian Vanhook, M.D.     RVS/MEDQ  D:  08/20/2011  T:  08/21/2011  Job:  [id number]  cc:   Pam Drown, M.D.

## 2012-01-22 LAB — HM MAMMOGRAPHY: HM MAMMO: NORMAL

## 2012-09-28 ENCOUNTER — Ambulatory Visit (INDEPENDENT_AMBULATORY_CARE_PROVIDER_SITE_OTHER): Payer: 59 | Admitting: Obstetrics & Gynecology

## 2012-09-28 ENCOUNTER — Encounter: Payer: Self-pay | Admitting: Obstetrics & Gynecology

## 2012-09-28 ENCOUNTER — Other Ambulatory Visit: Payer: Self-pay | Admitting: Obstetrics & Gynecology

## 2012-09-28 VITALS — BP 122/80 | HR 91 | Temp 98.2°F | Ht 62.0 in | Wt 128.6 lb

## 2012-09-28 DIAGNOSIS — Z01419 Encounter for gynecological examination (general) (routine) without abnormal findings: Secondary | ICD-10-CM

## 2012-09-28 DIAGNOSIS — Z1231 Encounter for screening mammogram for malignant neoplasm of breast: Secondary | ICD-10-CM

## 2012-09-28 DIAGNOSIS — N951 Menopausal and female climacteric states: Secondary | ICD-10-CM

## 2012-09-28 MED ORDER — BUTALBITAL-APAP-CAFFEINE 50-325-40 MG PO TABS: 1.0000 | ORAL_TABLET | Freq: Two times a day (BID) | ORAL | Status: DC | PRN

## 2012-09-28 MED ORDER — ESTRADIOL 0.1 MG/24HR TD PTWK: 1.0000 | MEDICATED_PATCH | TRANSDERMAL | Status: DC

## 2012-09-28 NOTE — Progress Notes (Signed)
.   Subjective:     Shelly Green is a 52 y.o. female here for a routine exam.  Current complaints: Patient states that she is having mood swings, hot flashes, unable to sleep at night and loss of appitite.  She would like to have her hormones tested and try to start hormones.  Personal health questionnaire reviewed: no.   Gynecologic History No LMP recorded. Patient has had a hysterectomy. Contraception: none Last mammogram: 2009. Results were: normal  Obstetric History OB History  No data available     The following portions of the patient's history were reviewed and updated as appropriate: allergies, current medications, past family history, past medical history, past social history, past surgical history and problem list.  Review of Systems Pertinent items are noted in HPI.    Objective:    General appearance: alert Breasts: normal appearance, no masses or tenderness Abdomen: soft, non-tender; bowel sounds normal; no masses,  no organomegaly Pelvic:  external genitalia normal, no adnexal masses or tenderness and vagina normal without discharge    Assessment:    Menopausal symptoms   Plan:   [facility] Return in 3 mths

## 2012-09-28 NOTE — Patient Instructions (Addendum)
Hormone Therapy At menopause, your body begins making less estrogen and progesterone hormones. This causes the body to stop having menstrual periods. This is because estrogen and progesterone hormones control your periods and menstrual cycle. A lack of estrogen may cause symptoms such as:  Hot flushes (or hot flashes).  Vaginal dryness.  Dry skin.  Loss of sex drive.  Risk of bone loss (osteoporosis). When this happens, you may choose to take hormone therapy to get back the estrogen lost during menopause. When the hormone estrogen is given alone, it is usually referred to as ET (Estrogen Therapy). When the hormone progestin is combined with estrogen, it is generally called HT (Hormone Therapy). This was formerly known as hormone replacement therapy (HRT). Your caregiver can help you make a decision on what will be best for you. The decision to use HT seems to change often as new studies are done. Many studies do not agree on the benefits of hormone replacement therapy. LIKELY BENEFITS OF HT INCLUDE PROTECTION FROM:  Hot Flushes (also called hot flashes) - A hot flush is a sudden feeling of heat that spreads over the face and body. The skin may redden like a blush. It is connected with sweats and sleep disturbance. Women going through menopause may have hot flushes a few times a month or several times per day depending on the woman.  Osteoporosis (bone loss)- Estrogen helps guard against bone loss. After menopause, a woman's bones slowly lose calcium and become weak and brittle. As a result, bones are more likely to break. The hip, wrist, and spine are affected most often. Hormone therapy can help slow bone loss after menopause. Weight bearing exercise and taking calcium with vitamin D also can help prevent bone loss. There are also medications that your caregiver can prescribe that can help prevent osteoporosis.  Vaginal Dryness - Loss of estrogen causes changes in the vagina. Its lining may  become thin and dry. These changes can cause pain and bleeding during sexual intercourse. Dryness can also lead to infections. This can cause burning and itching. (Vaginal estrogen treatment can help relieve pain, itching, and dryness.)  Urinary Tract Infections are more common after menopause because of lack of estrogen. Some women also develop urinary incontinence because of low estrogen levels in the vagina and bladder.  Possible other benefits of estrogen include a positive effect on mood and short-term memory in women. RISKS AND COMPLICATIONS  Using estrogen alone without progesterone causes the lining of the uterus to grow. This increases the risk of lining of the uterus (endometrial) cancer. Your caregiver should give another hormone called progestin if you have a uterus.  Women who take combined (estrogen and progestin) HT appear to have an increased risk of breast cancer. The risk appears to be small, but increases throughout the time that HT is taken.  Combined therapy also makes the breast tissue slightly denser which makes it harder to read mammograms (breast X-rays).  Combined, estrogen and progesterone therapy can be taken together every day, in which case there may be spotting of blood. HT therapy can be taken cyclically in which case you will have menstrual periods. Cyclically means HT is taken for a set amount of days, then not taken, then this process is repeated.  HT may increase the risk of stroke, heart attack, breast cancer and forming blood clots in your leg.  Transdermal estrogen (estrogen that is absorbed through the skin with a patch or a cream) may have more positive results with:    Cholesterol.  Blood pressure.  Blood clots. Having the following conditions may indicate you should not have HT:  Endometrial cancer.  Liver disease.  Breast cancer.  Heart disease.  History of blood clots.  Stroke. TREATMENT   If you choose to take HT and have a uterus,  usually estrogen and progestin are prescribed.  Your caregiver will help you decide the best way to take the medications.  Possible ways to take estrogen include:  Pills.  Patches.  Gels.  Sprays.  Vaginal estrogen cream, rings and tablets.  It is best to take the lowest dose possible that will help your symptoms and take them for the shortest period of time that you can.  Hormone therapy can help relieve some of the problems (symptoms) that affect women at menopause. Before making a decision about HT, talk to your caregiver about what is best for you. Be well informed and comfortable with your decisions. HOME CARE INSTRUCTIONS   Follow your caregivers advice when taking the medications.  A Pap test is done to screen for cervical cancer.  The first Pap test should be done at age 21.  Between ages 21 and 29, Pap tests are repeated every 2 years.  Beginning at age 30, you are advised to have a Pap test every 3 years as long as your past 3 Pap tests have been normal.  Some women have medical problems that increase the chance of getting cervical cancer. Talk to your caregiver about these problems. It is especially important to talk to your caregiver if a new problem develops soon after your last Pap test. In these cases, your caregiver may recommend more frequent screening and Pap tests.  The above recommendations are the same for women who have or have not gotten the vaccine for HPV (Human Papillomavirus).  If you had a hysterectomy for a problem that was not a cancer or a condition that could lead to cancer, then you no longer need Pap tests. However, even if you no longer need a Pap test, a regular exam is a good idea to make sure no other problems are starting.   If you are between ages 65 and 70, and you have had normal Pap tests going back 10 years, you no longer need Pap tests. However, even if you no longer need a Pap test, a regular exam is a good idea to make sure no  other problems are starting.   If you have had past treatment for cervical cancer or a condition that could lead to cancer, you need Pap tests and screening for cancer for at least 20 years after your treatment.  If Pap tests have been discontinued, risk factors (such as a new sexual partner) need to be re-assessed to determine if screening should be resumed.  Some women may need screenings more often if they are at high risk for cervical cancer.  Get mammograms done as per the advice of your caregiver. SEEK IMMEDIATE MEDICAL CARE IF:  You develop abnormal vaginal bleeding.  You have pain or swelling in your legs, shortness of breath, or chest pain.  You develop dizziness or headaches.  You have lumps or changes in your breasts or armpits.  You have slurred speech.  You develop weakness or numbness of your arms or legs.  You have pain, burning, or bleeding when urinating.  You develop abdominal pain. Document Released: 10/06/2002 Document Revised: 04/01/2011 Document Reviewed: 01/24/2010 Exit[employer] Patient Information 2014 [employer]. Menopause Menopause is the normal time   of life when menstrual periods stop completely. Menopause is complete when you have missed 12 consecutive menstrual periods. It usually occurs between the ages of 48 to 55, with an average age of 51. Very rarely does a woman develop menopause before 52 years old. At menopause, your ovaries stop producing the female hormones, estrogen and progesterone. This can cause undesirable symptoms and also affect your health. Sometimes the symptoms may occur 4 to 5 years before the menopause begins. There is no relationship between menopause and:  Oral contraceptives.  Number of children you had.  Race.  The age your menstrual periods started (menarche). Heavy smokers and very thin women may develop menopause earlier in life. CAUSES  The ovaries stop producing the female hormones estrogen and  progesterone.  Other causes include:  Surgery to remove both ovaries.  The ovaries stop functioning for no known reason.  Tumors of the pituitary gland in the brain.  Medical disease that affects the ovaries and hormone production.  Radiation treatment to the abdomen or pelvis.  Chemotherapy that affects the ovaries. SYMPTOMS   Hot flashes.  Night sweats.  Decrease in sex drive.  Vaginal dryness and thinning of the vagina causing painful intercourse.  Dryness of the skin and developing wrinkles.  Headaches.  Tiredness.  Irritability.  Memory problems.  Weight gain.  Bladder infections.  Hair growth of the face and chest.  Infertility. More serious symptoms include:  Loss of bone (osteoporosis) causing breaks (fractures).  Depression.  Hardening and narrowing of the arteries (atherosclerosis) causing heart attacks and strokes. DIAGNOSIS   When the menstrual periods have stopped for 12 straight months.  Physical exam.  Hormone studies of the blood. TREATMENT  There are many treatment choices and nearly as many questions about them. The decisions to treat or not to treat menopausal changes is an individual choice made with your caregiver. Your caregiver can discuss the treatments with you. Together, you can decide which treatment will work best for you. Your treatment choices may include:   Hormone therapy (estorgen and progesterone).  Non-hormonal medications.  Treating the individual symptoms with medication (for example antidepressants for depression).  Herbal medications that may help specific symptoms.  Counseling by a psychiatrist or psychologist.  Group therapy.  Lifestyle changes including:  Eating healthy.  Regular exercise.  Limiting caffeine and alcohol.  Stress management and meditation.  No treatment. HOME CARE INSTRUCTIONS   Take the medication your caregiver gives you as directed.  Get plenty of sleep and  rest.  Exercise regularly.  Eat a diet that contains calcium (good for the bones) and soy products (acts like estrogen hormone).  Avoid alcoholic beverages.  Do not smoke.  If you have hot flashes, dress in layers.  Take supplements, calcium and vitamin D to strengthen bones.  You can use over-the-counter lubricants or moisturizers for vaginal dryness.  Group therapy is sometimes very helpful.  Acupuncture may be helpful in some cases. SEEK MEDICAL CARE IF:   You are not sure you are in menopause.  You are having menopausal symptoms and need advice and treatment.  You are still having menstrual periods after age 55.  You have pain with intercourse.  Menopause is complete (no menstrual period for 12 months) and you develop vaginal bleeding.  You need a referral to a specialist (gynecologist, psychiatrist or psychologist) for treatment. SEEK IMMEDIATE MEDICAL CARE IF:   You have severe depression.  You have excessive vaginal bleeding.  You fell and think you have a broken   bone.  You have pain when you urinate.  You develop leg or chest pain.  You have a fast pounding heart beat (palpitations).  You have severe headaches.  You develop vision problems.  You feel a lump in your breast.  You have abdominal pain or severe indigestion. Document Released: 03/30/2003 Document Revised: 04/01/2011 Document Reviewed: 11/05/2007 Exit[employer] Patient Information 2014 [employer].  

## 2012-10-14 ENCOUNTER — Ambulatory Visit
Admission: RE | Admit: 2012-10-14 | Discharge: 2012-10-14 | Disposition: A | Discharge status: Home / Self Care | Payer: 59 | Source: Ambulatory Visit | Attending: Obstetrics & Gynecology | Admitting: Obstetrics & Gynecology

## 2012-10-14 DIAGNOSIS — Z1231 Encounter for screening mammogram for malignant neoplasm of breast: Secondary | ICD-10-CM

## 2012-11-30 ENCOUNTER — Ambulatory Visit: Payer: 59 | Admitting: Obstetrics & Gynecology

## 2012-12-02 ENCOUNTER — Ambulatory Visit: Payer: 59 | Admitting: Obstetrics & Gynecology

## 2012-12-04 ENCOUNTER — Ambulatory Visit: Payer: 59 | Admitting: Obstetrics & Gynecology

## 2013-04-19 ENCOUNTER — Encounter: Payer: Self-pay | Admitting: Obstetrics & Gynecology

## 2013-04-28 ENCOUNTER — Ambulatory Visit: Payer: 59 | Admitting: Physician Assistant

## 2013-04-30 ENCOUNTER — Encounter: Payer: Self-pay | Admitting: Obstetrics

## 2013-05-14 ENCOUNTER — Ambulatory Visit (INDEPENDENT_AMBULATORY_CARE_PROVIDER_SITE_OTHER): Payer: Managed Care, Other (non HMO) | Admitting: Internal Medicine

## 2013-05-14 ENCOUNTER — Encounter: Payer: Self-pay | Admitting: Internal Medicine

## 2013-05-14 ENCOUNTER — Other Ambulatory Visit (INDEPENDENT_AMBULATORY_CARE_PROVIDER_SITE_OTHER): Payer: Managed Care, Other (non HMO)

## 2013-05-14 VITALS — BP 120/82 | HR 58 | Temp 98.4°F | Ht 62.0 in | Wt 132.4 lb

## 2013-05-14 DIAGNOSIS — R110 Nausea: Secondary | ICD-10-CM

## 2013-05-14 DIAGNOSIS — R143 Flatulence: Secondary | ICD-10-CM

## 2013-05-14 DIAGNOSIS — R141 Gas pain: Secondary | ICD-10-CM

## 2013-05-14 DIAGNOSIS — Z0000 Encounter for general adult medical examination without abnormal findings: Secondary | ICD-10-CM

## 2013-05-14 DIAGNOSIS — R140 Abdominal distension (gaseous): Secondary | ICD-10-CM

## 2013-05-14 DIAGNOSIS — F4010 Social phobia, unspecified: Secondary | ICD-10-CM | POA: Insufficient documentation

## 2013-05-14 DIAGNOSIS — R142 Eructation: Secondary | ICD-10-CM

## 2013-05-14 LAB — URINALYSIS, ROUTINE W REFLEX MICROSCOPIC
Bilirubin Urine: NEGATIVE
KETONES UR: NEGATIVE
LEUKOCYTES UA: NEGATIVE
NITRITE: NEGATIVE
Total Protein, Urine: NEGATIVE
Urine Glucose: NEGATIVE
Urobilinogen, UA: 0.2 (ref 0.0–1.0)
pH: 6 (ref 5.0–8.0)

## 2013-05-14 LAB — LIPID PANEL
CHOLESTEROL: 186 mg/dL (ref 0–200)
HDL: 37.3 mg/dL — ABNORMAL LOW (ref >39.00)
LDL Cholesterol: 109 mg/dL — ABNORMAL HIGH (ref 0–99)
Total CHOL/HDL Ratio: 5
Triglycerides: 199 mg/dL — ABNORMAL HIGH (ref 0.0–149.0)
VLDL: 39.8 mg/dL (ref 0.0–40.0)

## 2013-05-14 LAB — CBC WITH DIFFERENTIAL/PLATELET
BASOS ABS: 0 10*3/uL (ref 0.0–0.1)
BASOS PCT: 0.5 % (ref 0.0–3.0)
EOS ABS: 0.1 10*3/uL (ref 0.0–0.7)
Eosinophils Relative: 1.4 % (ref 0.0–5.0)
HEMATOCRIT: 43.8 % (ref 36.0–46.0)
HEMOGLOBIN: 14.6 g/dL (ref 12.0–15.0)
LYMPHS ABS: 2.7 10*3/uL (ref 0.7–4.0)
LYMPHS PCT: 34.7 % (ref 12.0–46.0)
MCHC: 33.4 g/dL (ref 30.0–36.0)
MCV: 94.9 fl (ref 78.0–100.0)
MONOS PCT: 6.6 % (ref 3.0–12.0)
Monocytes Absolute: 0.5 10*3/uL (ref 0.1–1.0)
NEUTROS ABS: 4.4 10*3/uL (ref 1.4–7.7)
Neutrophils Relative %: 56.8 % (ref 43.0–77.0)
Platelets: 204 10*3/uL (ref 150.0–400.0)
RBC: 4.61 Mil/uL (ref 3.87–5.11)
RDW: 13.1 % (ref 11.5–14.6)
WBC: 7.7 10*3/uL (ref 4.5–10.5)

## 2013-05-14 LAB — HEPATIC FUNCTION PANEL
ALBUMIN: 4.2 g/dL (ref 3.5–5.2)
ALT: 13 U/L (ref 0–35)
AST: 17 U/L (ref 0–37)
Alkaline Phosphatase: 87 U/L (ref 39–117)
Bilirubin, Direct: 0 mg/dL (ref 0.0–0.3)
Total Bilirubin: 0.5 mg/dL (ref 0.3–1.2)
Total Protein: 7 g/dL (ref 6.0–8.3)

## 2013-05-14 LAB — TSH: TSH: 2.51 u[IU]/mL (ref 0.35–5.50)

## 2013-05-14 LAB — BASIC METABOLIC PANEL
BUN: 13 mg/dL (ref 6–23)
CALCIUM: 9.7 mg/dL (ref 8.4–10.5)
CO2: 27 mEq/L (ref 19–32)
Chloride: 107 mEq/L (ref 96–112)
Creatinine, Ser: 0.6 mg/dL (ref 0.4–1.2)
GFR: 115.77 mL/min (ref >60.00)
Glucose, Bld: 85 mg/dL (ref 70–99)
Potassium: 3.7 mEq/L (ref 3.5–5.1)
SODIUM: 141 meq/L (ref 135–145)

## 2013-05-14 NOTE — Patient Instructions (Addendum)
It was good to see you today.  We have reviewed your prior records including labs and tests today  we will send to your prior provider(s) for "release of records" as discussed today.  Health Maintenance reviewed - check on date of last tetanus shot (every 10 years) - all other recommended immunizations and age-appropriate screenings are up-to-date.  Test(s) ordered today. Your results will be released to [place] (or called to you) after review, usually within 72hours after test completion. If any changes need to be made, you will be notified at that same time.  Medications reviewed and updated, no changes recommended at this time.  Please schedule followup in 12 months for annual exam and labs, call sooner if problems. Health Maintenance, Female A healthy lifestyle and preventative care can promote health and wellness.  Maintain regular health, dental, and eye exams.  Eat a healthy diet. Foods like vegetables, fruits, whole grains, low-fat dairy products, and lean protein foods contain the nutrients you need without too many calories. Decrease your intake of foods high in solid fats, added sugars, and salt. Get information about a proper diet from your caregiver, if necessary.  Regular physical exercise is one of the most important things you can do for your health. Most adults should get at least 150 minutes of moderate-intensity exercise (any activity that increases your heart rate and causes you to sweat) each week. In addition, most adults need muscle-strengthening exercises on 2 or more days a week.   Maintain a healthy weight. The body mass index (BMI) is a screening tool to identify possible weight problems. It provides an estimate of body fat based on height and weight. Your caregiver can help determine your BMI, and can help you achieve or maintain a healthy weight. For adults 20 years and older:  A BMI below 18.5 is considered underweight.  A BMI of 18.5 to 24.9 is normal.  A  BMI of 25 to 29.9 is considered overweight.  A BMI of 30 and above is considered obese.  Maintain normal blood lipids and cholesterol by exercising and minimizing your intake of saturated fat. Eat a balanced diet with plenty of fruits and vegetables. Blood tests for lipids and cholesterol should begin at age 7 and be repeated every 5 years. If your lipid or cholesterol levels are high, you are over 50, or you are a high risk for heart disease, you may need your cholesterol levels checked more frequently.Ongoing high lipid and cholesterol levels should be treated with medicines if diet and exercise are not effective.  If you smoke, find out from your caregiver how to quit. If you do not use tobacco, do not start.  Lung cancer screening is recommended for adults aged 88 80 years who are at high risk for developing lung cancer because of a history of smoking. Yearly low-dose computed tomography (CT) is recommended for people who have at least a 30-pack-year history of smoking and are a current smoker or have quit within the past 15 years. A pack year of smoking is smoking an average of 1 pack of cigarettes a day for 1 year (for example: 1 pack a day for 30 years or 2 packs a day for 15 years). Yearly screening should continue until the smoker has stopped smoking for at least 15 years. Yearly screening should also be stopped for people who develop a health problem that would prevent them from having lung cancer treatment.  If you are pregnant, do not drink alcohol. If you are  breastfeeding, be very cautious about drinking alcohol. If you are not pregnant and choose to drink alcohol, do not exceed 1 drink per day. One drink is considered to be 12 ounces (355 mL) of beer, 5 ounces (148 mL) of wine, or 1.5 ounces (44 mL) of liquor.  Avoid use of street drugs. Do not share needles with anyone. Ask for help if you need support or instructions about stopping the use of drugs.  High blood pressure causes heart  disease and increases the risk of stroke. Blood pressure should be checked at least every 1 to 2 years. Ongoing high blood pressure should be treated with medicines, if weight loss and exercise are not effective.  If you are 67 to 53 years old, ask your caregiver if you should take aspirin to prevent strokes.  Diabetes screening involves taking a blood sample to check your fasting blood sugar level. This should be done once every 3 years, after age 36, if you are within normal weight and without risk factors for diabetes. Testing should be considered at a younger age or be carried out more frequently if you are overweight and have at least 1 risk factor for diabetes.  Breast cancer screening is essential preventative care for women. You should practice "breast self-awareness." This means understanding the normal appearance and feel of your breasts and may include breast self-examination. Any changes detected, no matter how small, should be reported to a caregiver. Women in their 35s and 30s should have a clinical breast exam (CBE) by a caregiver as part of a regular health exam every 1 to 3 years. After age 58, women should have a CBE every year. Starting at age 45, women should consider having a mammogram (breast X-ray) every year. Women who have a family history of breast cancer should talk to their caregiver about genetic screening. Women at a high risk of breast cancer should talk to their caregiver about having an MRI and a mammogram every year.  Breast cancer gene (BRCA)-related cancer risk assessment is recommended for women who have family members with BRCA-related cancers. BRCA-related cancers include breast, ovarian, tubal, and peritoneal cancers. Having family members with these cancers may be associated with an increased risk for harmful changes (mutations) in the breast cancer genes BRCA1 and BRCA2. Results of the assessment will determine the need for genetic counseling and BRCA1 and BRCA2  testing.  The Pap test is a screening test for cervical cancer. Women should have a Pap test starting at age 15. Between ages 89 and 45, Pap tests should be repeated every 2 years. Beginning at age 98, you should have a Pap test every 3 years as long as the past 3 Pap tests have been normal. If you had a hysterectomy for a problem that was not cancer or a condition that could lead to cancer, then you no longer need Pap tests. If you are between ages 39 and 63, and you have had normal Pap tests going back 10 years, you no longer need Pap tests. If you have had past treatment for cervical cancer or a condition that could lead to cancer, you need Pap tests and screening for cancer for at least 20 years after your treatment. If Pap tests have been discontinued, risk factors (such as a new sexual partner) need to be reassessed to determine if screening should be resumed. Some women have medical problems that increase the chance of getting cervical cancer. In these cases, your caregiver may recommend more frequent screening  and Pap tests.  The human papillomavirus (HPV) test is an additional test that may be used for cervical cancer screening. The HPV test looks for the virus that can cause the cell changes on the cervix. The cells collected during the Pap test can be tested for HPV. The HPV test could be used to screen women aged 31 years and older, and should be used in women of any age who have unclear Pap test results. After the age of 65, women should have HPV testing at the same frequency as a Pap test.  Colorectal cancer can be detected and often prevented. Most routine colorectal cancer screening begins at the age of 22 and continues through age 15. However, your caregiver may recommend screening at an earlier age if you have risk factors for colon cancer. On a yearly basis, your caregiver may provide home test kits to check for hidden blood in the stool. Use of a small camera at the end of a tube, to  directly examine the colon (sigmoidoscopy or colonoscopy), can detect the earliest forms of colorectal cancer. Talk to your caregiver about this at age 15, when routine screening begins. Direct examination of the colon should be repeated every 5 to 10 years through age 34, unless early forms of pre-cancerous polyps or small growths are found.  Hepatitis C blood testing is recommended for all people born from [birth year] through 1965 and any individual with known risks for hepatitis C.  Practice safe sex. Use condoms and avoid high-risk sexual practices to reduce the spread of sexually transmitted infections (STIs). Sexually active women aged 34 and younger should be checked for Chlamydia, which is a common sexually transmitted infection. Older women with new or multiple partners should also be tested for Chlamydia. Testing for other STIs is recommended if you are sexually active and at increased risk.  Osteoporosis is a disease in which the bones lose minerals and strength with aging. This can result in serious bone fractures. The risk of osteoporosis can be identified using a bone density scan. Women ages 68 and over and women at risk for fractures or osteoporosis should discuss screening with their caregivers. Ask your caregiver whether you should be taking a calcium supplement or vitamin D to reduce the rate of osteoporosis.  Menopause can be associated with physical symptoms and risks. Hormone replacement therapy is available to decrease symptoms and risks. You should talk to your caregiver about whether hormone replacement therapy is right for you.  Use sunscreen. Apply sunscreen liberally and repeatedly throughout the day. You should seek shade when your shadow is shorter than you. Protect yourself by wearing long sleeves, pants, a wide-brimmed hat, and sunglasses year round, whenever you are outdoors.  Notify your caregiver of new moles or changes in moles, especially if there is a change in shape or  color. Also notify your caregiver if a mole is larger than the size of a pencil eraser.  Stay current with your immunizations. Document Released: 07/23/2010 Document Revised: 05/04/2012 Document Reviewed: 07/23/2010 [employer] Patient Information 2014 [employer].

## 2013-05-14 NOTE — Progress Notes (Signed)
Subjective:    Patient ID: Shelly Green, female    DOB: 1960/11/09, 53 y.o.   MRN: [id number]  HPI  New patient to me and our practice, here to establish with primary care provider Also, patient is here today for annual physical. Patient feels well overall  Past Medical History  Diagnosis Date  . IBS (irritable bowel syndrome)   . Seasonal allergies   . Bipolar 1 disorder   . Menopause syndrome 09/28/2012  . Arthritis   . Migraines   . History of colon polyps    Family History  Problem Relation Age of Onset  . Arthritis Mother   . Colon cancer Sister   . Mental illness Daughter   . Mental illness Maternal Aunt   . Arthritis Maternal Grandmother   . Hypertension Maternal Grandfather    History  Substance Use Topics  . Smoking status: Current Every Day Smoker -- 1.50 packs/day for 35 years    Types: Cigarettes  . Smokeless tobacco: Never Used  . Alcohol Use: No    Review of Systems  Constitutional: Positive for fatigue. Negative for unexpected weight change.  Respiratory: Negative for cough, shortness of breath and wheezing.   Cardiovascular: Negative for chest pain, palpitations and leg swelling.  Gastrointestinal: Positive for nausea and abdominal distention ("bloat", chronic, worse with meals). Negative for vomiting, abdominal pain, diarrhea, constipation and blood in stool.  Neurological: Negative for dizziness, weakness, light-headedness and headaches.  Psychiatric/Behavioral: Negative for dysphoric mood. The patient is not nervous/anxious.   All other systems reviewed and are negative.      Objective:   Physical Exam  BP 120/82  Pulse 58  Temp(Src) 98.4 F (36.9 C) (Oral)  Ht 5\' 2"  (1.575 m)  Wt 132 lb 6.4 oz (60.056 kg)  BMI 24.21 kg/m2  SpO2 98% Wt Readings from Last 3 Encounters:  05/14/13 132 lb 6.4 oz (60.056 kg)  09/28/12 128 lb 9.6 oz (58.333 kg)  08/19/11 126 lb (57.153 kg)   Constitutional: She appears well-developed and well-nourished.  No distress.  HENT: Head: Normocephalic and atraumatic. Ears: B TMs ok, no erythema or effusion; Nose: Nose normal. Mouth/Throat: Oropharynx is clear and moist. No oropharyngeal exudate.  Eyes: Conjunctivae and EOM are normal. Pupils are equal, round, and reactive to light. No scleral icterus.  Neck: Normal range of motion. Neck supple. No JVD present. No thyromegaly present.  Cardiovascular: Normal rate, regular rhythm and normal heart sounds.  No murmur heard. No BLE edema. Pulmonary/Chest: Effort normal and breath sounds normal. No respiratory distress. She has no wheezes.  Abdominal: Soft. Bowel sounds are normal. She exhibits no distension. There is no tenderness. no masses Musculoskeletal: Normal range of motion, no joint effusions. No gross deformities Neurological: She is alert and oriented to person, place, and time. No cranial nerve deficit. Coordination, balance, strength, speech and gait are normal.  Skin: Skin is warm and dry. No rash noted. No erythema.  Psychiatric: She has a mildly anxious and expansive mood and affect. Her behavior is normal. Judgment and thought content normal.    Lab Results  Component Value Date   HGB 13.4 08/20/2011    Mm Digital Screening  10/15/2012   CLINICAL DATA:  Screening.  EXAM: DIGITAL SCREENING BILATERAL MAMMOGRAM WITH CAD  COMPARISON:  Previous exam(s).  ACR Breast Density Category c: The breasts are heterogeneously dense, which may obscure small masses.  FINDINGS: There are no findings suspicious for malignancy. Images were processed with CAD.  IMPRESSION: No mammographic  evidence of malignancy. A result letter of this screening mammogram will be mailed directly to the patient.  RECOMMENDATION: Screening mammogram in one year. (Code:SM-B-01Y)  BI-RADS CATEGORY  1: Negative   Electronically Signed   By: Everlean Alstrom M.D.   On: 10/15/2012 09:29       Assessment & Plan:   CPX/v70.0 - Patient has been counseled on age-appropriate routine  health concerns for screening and prevention. These are reviewed and up-to-date. Immunizations are up-to-date or declined. Labs ordered and reviewed.  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 Symptoms exacerbated by perimenopausal change and family stressors with daughter's attempted suicide December 2014 Reports his symptoms have stabilized with use of Pristiq No longer requiring Xanax - Denies recent use of Adderall Interval history reviewed, support provided; no changes recommend     Other Visit Diagnoses   Routine general medical examination at a health care facility    -  Primary    Relevant Orders       Basic metabolic panel       CBC with Differential       Hepatic function panel       Lipid panel       TSH       Urinalysis, Routine w reflex microscopic    Nausea alone        Relevant Orders       Celiac panel    Postprandial abdominal bloating        Relevant Orders       Celiac panel

## 2013-05-14 NOTE — Assessment & Plan Note (Signed)
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

## 2013-05-14 NOTE — Progress Notes (Signed)
Pre visit review using our clinic review tool, if applicable. No additional management support is needed unless otherwise documented below in the visit note. 

## 2013-05-17 LAB — GLIA (IGA/G) + TTG IGA
Gliadin IgA: 2.9 U/mL (ref <20)
Gliadin IgG: 3.1 U/mL (ref <20)
Tissue Transglutaminase Ab, IgA: 2.3 U/mL (ref <20)

## 2013-08-04 ENCOUNTER — Ambulatory Visit (INDEPENDENT_AMBULATORY_CARE_PROVIDER_SITE_OTHER): Payer: Managed Care, Other (non HMO) | Admitting: Obstetrics & Gynecology

## 2013-08-04 ENCOUNTER — Encounter: Payer: Self-pay | Admitting: Obstetrics & Gynecology

## 2013-08-04 VITALS — BP 114/69 | HR 94 | Temp 98.0°F | Ht 62.0 in | Wt 130.0 lb

## 2013-08-04 DIAGNOSIS — N951 Menopausal and female climacteric states: Secondary | ICD-10-CM

## 2013-08-04 DIAGNOSIS — Z79890 Hormone replacement therapy (postmenopausal): Secondary | ICD-10-CM

## 2013-08-04 MED ORDER — ESTROGENS CONJUGATED 0.625 MG PO TABS: 0.6250 mg | ORAL_TABLET | Freq: Every day | ORAL | Status: DC

## 2013-08-04 NOTE — Progress Notes (Signed)
Subjective:    Shelly Green is a 53 y.o. female who presents to discuss hormone replacement therapy. Patient is requesting hormone replacement therapy due to hot flashes, moodiness and vaginal dryness. The patient is not currently taking hormone replacement therapy. The patient is sexually active. GYN screening history: last mammogram: was normal. Patient is hormone deficient due to menopause.    Current hormone therapy:  None  Previous hormone therapy:  Estrogen: estrogen patch Reason for starting HRT: hot flashes   Menstrual History: OB History   Grav Para Term Preterm Abortions TAB SAB Ect Mult Living   4 3 3  1  1   3        No LMP recorded. Patient has had a hysterectomy.     Past Medical History  Diagnosis Date  . IBS (irritable bowel syndrome)   . Seasonal allergies   . Arthritis   . Migraines   . History of colon polyps   . Menopausal disorder   . Social anxiety disorder     Past Surgical History  Procedure Laterality Date  . Laparoscopic cholecystectomy  2007  . Total abdominal hysterectomy    . Hemorroidectomy    . Carpal tunnel release  08/20/2011    Procedure: CARPAL TUNNEL RELEASE;  Surgeon: Cammie Sickle., MD;  Location: [facility];  Service: Orthopedics;  Laterality: Right;    Current outpatient prescriptions:butalbital-acetaminophen-caffeine (ESGIC) 50-325-40 MG per tablet, Take 1 tablet by mouth 2 (two) times daily as needed for headache., Disp: 14 tablet, Rfl: 0;  desvenlafaxine (PRISTIQ) 50 MG 24 hr tablet, Take 50 mg by mouth at bedtime., Disp: , Rfl:  Allergies  Allergen Reactions  . Codeine Anaphylaxis  . Hydrocodone Shortness Of Breath  . Penicillins Anaphylaxis  . Sulfa Antibiotics Anaphylaxis and Rash  . Tramadol Shortness Of Breath    History  Substance Use Topics  . Smoking status: Former Smoker -- 1.50 packs/day for 35 years    Types: Cigarettes    Quit date: 06/21/2013  . Smokeless tobacco: Never Used  .  Alcohol Use: No    Family History  Problem Relation Age of Onset  . Arthritis Mother   . Colon cancer Sister 60  . Mental illness Daughter   . Mental illness Maternal Aunt   . Arthritis Maternal Grandmother   . Hypertension Maternal Grandfather   . Lymphoma Mother     Review of Systems Constitutional: positive for night sweats Genitourinary:positive for hot flashes, positive for sex problemsIntegument/breast: negative for breast lump, breast tenderness, nipple discharge and skin lesion(s) Behavioral/Psych: negative for depression Endocrine: positive for temperature intolerance    Objective:     BP 114/69  Pulse 94  Temp(Src) 98 F (36.7 C)  Ht 5\' 2"  (1.575 m)  Wt 58.968 kg (130 lb)  BMI 23.77 kg/m2    50% of 20 min visit spent on counseling and coordination of care.   Assessment:    Hormone replacement therapy in 53 y.o. woman.  Low cardiovascular risk  Low libido Plan:    Patient requests HRT. Risks and benefits of HRT, including recent evidence on HRT effects on breast cancer and heart disease, were discussed. Will begin patient on Estrogen: Premarin 0.625 mg daily. Need to obtain previous records Possible management options include: Wellbutrin, topical testosterone, sex therapist Follow up as needed.

## 2013-08-05 NOTE — Patient Instructions (Signed)

## 2013-08-09 ENCOUNTER — Institutional Professional Consult (permissible substitution): Payer: 59 | Admitting: Obstetrics & Gynecology

## 2013-09-24 ENCOUNTER — Other Ambulatory Visit (INDEPENDENT_AMBULATORY_CARE_PROVIDER_SITE_OTHER): Payer: Managed Care, Other (non HMO)

## 2013-09-24 ENCOUNTER — Ambulatory Visit (INDEPENDENT_AMBULATORY_CARE_PROVIDER_SITE_OTHER)
Admission: RE | Admit: 2013-09-24 | Discharge: 2013-09-24 | Disposition: A | Discharge status: Home / Self Care | Payer: Managed Care, Other (non HMO) | Source: Ambulatory Visit | Attending: Internal Medicine | Admitting: Internal Medicine

## 2013-09-24 ENCOUNTER — Ambulatory Visit (INDEPENDENT_AMBULATORY_CARE_PROVIDER_SITE_OTHER): Payer: Managed Care, Other (non HMO) | Admitting: Internal Medicine

## 2013-09-24 ENCOUNTER — Encounter: Payer: Self-pay | Admitting: Internal Medicine

## 2013-09-24 VITALS — BP 130/90 | HR 76 | Temp 98.3°F | Resp 16 | Wt 128.0 lb

## 2013-09-24 DIAGNOSIS — G8929 Other chronic pain: Secondary | ICD-10-CM

## 2013-09-24 DIAGNOSIS — R0602 Shortness of breath: Secondary | ICD-10-CM

## 2013-09-24 DIAGNOSIS — M545 Low back pain, unspecified: Secondary | ICD-10-CM

## 2013-09-24 DIAGNOSIS — R109 Unspecified abdominal pain: Secondary | ICD-10-CM

## 2013-09-24 DIAGNOSIS — N951 Menopausal and female climacteric states: Secondary | ICD-10-CM

## 2013-09-24 DIAGNOSIS — R197 Diarrhea, unspecified: Secondary | ICD-10-CM

## 2013-09-24 DIAGNOSIS — F4010 Social phobia, unspecified: Secondary | ICD-10-CM

## 2013-09-24 DIAGNOSIS — N809 Endometriosis, unspecified: Secondary | ICD-10-CM

## 2013-09-24 DIAGNOSIS — F17200 Nicotine dependence, unspecified, uncomplicated: Secondary | ICD-10-CM

## 2013-09-24 LAB — CBC WITH DIFFERENTIAL/PLATELET
Basophils Absolute: 0 10*3/uL (ref 0.0–0.1)
Basophils Relative: 0.3 % (ref 0.0–3.0)
EOS ABS: 0.1 10*3/uL (ref 0.0–0.7)
EOS PCT: 1.2 % (ref 0.0–5.0)
HCT: 45.2 % (ref 36.0–46.0)
Hemoglobin: 15.3 g/dL — ABNORMAL HIGH (ref 12.0–15.0)
Lymphocytes Relative: 21.9 % (ref 12.0–46.0)
Lymphs Abs: 2.1 10*3/uL (ref 0.7–4.0)
MCHC: 33.8 g/dL (ref 30.0–36.0)
MCV: 95.5 fl (ref 78.0–100.0)
MONO ABS: 0.6 10*3/uL (ref 0.1–1.0)
Monocytes Relative: 5.9 % (ref 3.0–12.0)
Neutro Abs: 6.9 10*3/uL (ref 1.4–7.7)
Neutrophils Relative %: 70.7 % (ref 43.0–77.0)
Platelets: 207 10*3/uL (ref 150.0–400.0)
RBC: 4.73 Mil/uL (ref 3.87–5.11)
RDW: 13.4 % (ref 11.5–15.5)
WBC: 9.7 10*3/uL (ref 4.0–10.5)

## 2013-09-24 LAB — URINALYSIS, ROUTINE W REFLEX MICROSCOPIC
Bilirubin Urine: NEGATIVE
KETONES UR: NEGATIVE
LEUKOCYTES UA: NEGATIVE
Nitrite: NEGATIVE
PH: 5.5 (ref 5.0–8.0)
Specific Gravity, Urine: >=1.03 — AB (ref 1.000–1.030)
Total Protein, Urine: NEGATIVE
Urine Glucose: NEGATIVE
Urobilinogen, UA: 0.2 (ref 0.0–1.0)

## 2013-09-24 LAB — HEPATIC FUNCTION PANEL
ALK PHOS: 80 U/L (ref 39–117)
ALT: 11 U/L (ref 0–35)
AST: 21 U/L (ref 0–37)
Albumin: 4.1 g/dL (ref 3.5–5.2)
Bilirubin, Direct: 0.1 mg/dL (ref 0.0–0.3)
TOTAL PROTEIN: 7.3 g/dL (ref 6.0–8.3)
Total Bilirubin: 0.5 mg/dL (ref 0.2–1.2)

## 2013-09-24 LAB — VITAMIN D 25 HYDROXY (VIT D DEFICIENCY, FRACTURES): VITD: 31.14 ng/mL (ref 30.00–100.00)

## 2013-09-24 LAB — SEDIMENTATION RATE: Sed Rate: 9 mm/hr (ref 0–22)

## 2013-09-24 LAB — BASIC METABOLIC PANEL
BUN: 14 mg/dL (ref 6–23)
CHLORIDE: 107 meq/L (ref 96–112)
CO2: 28 mEq/L (ref 19–32)
CREATININE: 0.7 mg/dL (ref 0.4–1.2)
Calcium: 9.1 mg/dL (ref 8.4–10.5)
GFR: 91.54 mL/min (ref >60.00)
Glucose, Bld: 107 mg/dL — ABNORMAL HIGH (ref 70–99)
Potassium: 4.2 mEq/L (ref 3.5–5.1)
Sodium: 141 mEq/L (ref 135–145)

## 2013-09-24 LAB — H. PYLORI ANTIBODY, IGG: H PYLORI IGG: NEGATIVE

## 2013-09-24 LAB — VITAMIN B12: VITAMIN B 12: 424 pg/mL (ref 211–911)

## 2013-09-24 LAB — TSH: TSH: 1.51 u[IU]/mL (ref 0.35–4.50)

## 2013-09-24 MED ORDER — BUSPIRONE HCL 10 MG PO TABS: 10.0000 mg | ORAL_TABLET | Freq: Two times a day (BID) | ORAL | Status: DC

## 2013-09-24 NOTE — Progress Notes (Signed)
   Subjective:    Patient ID: Shelly Green, female    DOB: 1960-12-29, 53 y.o.   MRN: [id number]  HPI  C/o LBP x long time, abdominal pain x long time (endometriosis), ?IBS-c, ADD, anxiety  Review of Systems  Constitutional: Positive for unexpected weight change. Negative for chills, activity change, appetite change and fatigue.  HENT: Negative for congestion, mouth sores and sinus pressure.   Eyes: Negative for visual disturbance.  Respiratory: Negative for cough and chest tightness.   Cardiovascular: Negative for leg swelling.  Gastrointestinal: Positive for constipation. Negative for nausea and abdominal pain.  Genitourinary: Negative for urgency, frequency, vaginal discharge, difficulty urinating and vaginal pain.  Musculoskeletal: Positive for back pain. Negative for gait problem.  Skin: Negative for pallor and rash.  Neurological: Negative for dizziness, tremors, weakness, numbness and headaches.  Psychiatric/Behavioral: Positive for decreased concentration. Negative for suicidal ideas, confusion and sleep disturbance. The patient is nervous/anxious.        Objective:   Physical Exam  Constitutional: She appears well-developed. No distress.  HENT:  Head: Normocephalic.  Right Ear: External ear normal.  Left Ear: External ear normal.  Nose: Nose normal.  Mouth/Throat: Oropharynx is clear and moist.  Eyes: Conjunctivae are normal. Pupils are equal, round, and reactive to light. Right eye exhibits no discharge. Left eye exhibits no discharge.  Neck: Normal range of motion. Neck supple. No JVD present. No tracheal deviation present. No thyromegaly present.  Cardiovascular: Normal rate, regular rhythm and normal heart sounds.   Pulmonary/Chest: No stridor. No respiratory distress. She has no wheezes.  Abdominal: Soft. Bowel sounds are normal. She exhibits no distension and no mass. There is tenderness (sensitive). There is no rebound and no guarding.  Musculoskeletal: She  exhibits no edema and no tenderness.  Lymphadenopathy:    She has no cervical adenopathy.  Neurological: She displays normal reflexes. No cranial nerve deficit. She exhibits normal muscle tone. Coordination normal.  Skin: No rash noted. No erythema.  Psychiatric: She has a normal mood and affect. Her behavior is normal. Judgment and thought content normal.    Lab Results  Component Value Date   WBC 7.7 05/14/2013   HGB 14.6 05/14/2013   HCT 43.8 05/14/2013   PLT 204.0 05/14/2013   GLUCOSE 85 05/14/2013   CHOL 186 05/14/2013   TRIG 199.0* 05/14/2013   HDL 37.30* 05/14/2013   LDLCALC 109* 05/14/2013   ALT 13 05/14/2013   AST 17 05/14/2013   NA 141 05/14/2013   K 3.7 05/14/2013   CL 107 05/14/2013   CREATININE 0.6 05/14/2013   BUN 13 05/14/2013   CO2 27 05/14/2013   TSH 2.51 05/14/2013         Assessment & Plan:

## 2013-09-24 NOTE — Assessment & Plan Note (Signed)
Opioid intolerant

## 2013-09-24 NOTE — Patient Instructions (Signed)
Gluten free trial (no wheat products) for 4-6 weeks. OK to use gluten-free bread and gluten-free pasta.  Milk free trial (no milk, ice cream, cheese and yogurt) for 4-6 weeks. OK to use almond, coconut or rice  milk. "Almond breeze" brand tastes good.

## 2013-09-24 NOTE — Assessment & Plan Note (Signed)
Colon due 2017 Dr Cristina Gong   Chronic ?IBS

## 2013-09-24 NOTE — Assessment & Plan Note (Signed)
On HRT per her GYN °

## 2013-09-24 NOTE — Assessment & Plan Note (Signed)
Chronic w/LBP, abd pain - better post-hysterectomy

## 2013-09-24 NOTE — Assessment & Plan Note (Signed)
Not on Rx We can try Buspar

## 2013-09-24 NOTE — Progress Notes (Deleted)
Pre visit review using our clinic review tool, if applicable. No additional management support is needed unless otherwise documented below in the visit note. 

## 2013-09-24 NOTE — Assessment & Plan Note (Signed)
Relapsing

## 2013-09-28 ENCOUNTER — Other Ambulatory Visit: Payer: Managed Care, Other (non HMO)

## 2013-09-28 DIAGNOSIS — R109 Unspecified abdominal pain: Secondary | ICD-10-CM

## 2013-09-28 DIAGNOSIS — R197 Diarrhea, unspecified: Secondary | ICD-10-CM

## 2013-09-30 LAB — GIARDIA/CRYPTOSPORIDIUM (EIA)
Cryptosporidium Screen (EIA): NEGATIVE
GIARDIA SCREEN (EIA): NEGATIVE

## 2013-10-28 ENCOUNTER — Ambulatory Visit: Payer: Managed Care, Other (non HMO) | Admitting: Internal Medicine

## 2013-10-28 ENCOUNTER — Telehealth: Payer: Self-pay | Admitting: Obstetrics & Gynecology

## 2013-11-03 NOTE — Telephone Encounter (Signed)
Task completed

## 2013-11-04 ENCOUNTER — Ambulatory Visit: Payer: Managed Care, Other (non HMO) | Admitting: Obstetrics & Gynecology

## 2013-11-11 ENCOUNTER — Ambulatory Visit (INDEPENDENT_AMBULATORY_CARE_PROVIDER_SITE_OTHER): Payer: Managed Care, Other (non HMO) | Admitting: Obstetrics & Gynecology

## 2013-11-11 ENCOUNTER — Encounter: Payer: Self-pay | Admitting: Obstetrics & Gynecology

## 2013-11-11 VITALS — BP 129/84 | HR 88 | Temp 98.0°F | Ht 63.0 in | Wt 130.0 lb

## 2013-11-11 DIAGNOSIS — R6882 Decreased libido: Secondary | ICD-10-CM

## 2013-11-11 DIAGNOSIS — N951 Menopausal and female climacteric states: Secondary | ICD-10-CM

## 2013-11-12 DIAGNOSIS — R6882 Decreased libido: Secondary | ICD-10-CM | POA: Insufficient documentation

## 2013-11-12 NOTE — Progress Notes (Signed)
Patient ID: Shelly Green, female   DOB: 1960-09-05, 53 y.o.   MRN: [id number]  Chief Complaint  Patient presents with  . Follow-up    Medication & Menopause    HPI Shelly Green is a 53 y.o. female.  Hot flashes improving on ERT.  HPI  Past Medical History  Diagnosis Date  . IBS (irritable bowel syndrome)   . Seasonal allergies   . Arthritis   . Migraines   . History of colon polyps   . Menopausal disorder   . Social anxiety disorder     Past Surgical History  Procedure Laterality Date  . Laparoscopic cholecystectomy  2007  . Total abdominal hysterectomy    . Hemorroidectomy    . Carpal tunnel release  08/20/2011    Procedure: CARPAL TUNNEL RELEASE;  Surgeon: Wyn Forster., MD;  Location: [facility];  Service: Orthopedics;  Laterality: Right;    Family History  Problem Relation Age of Onset  . Arthritis Mother   . Colon cancer Sister 97  . Mental illness Daughter   . Mental illness Maternal Aunt   . Arthritis Maternal Grandmother   . Hypertension Maternal Grandfather   . Lymphoma Mother     Social History History  Substance Use Topics  . Smoking status: Current Every Day Smoker -- 1.50 packs/day for 35 years    Types: Cigarettes    Last Attempt to Quit: 06/21/2013  . Smokeless tobacco: Never Used  . Alcohol Use: No    Allergies  Allergen Reactions  . Codeine Anaphylaxis  . Hydrocodone Shortness Of Breath  . Penicillins Anaphylaxis  . Sulfa Antibiotics Anaphylaxis and Rash  . Tramadol Shortness Of Breath    Current Outpatient Prescriptions  Medication Sig Dispense Refill  . Amphetamine-Dextroamphetamine (AMPHETAMINE SALT COMBO PO) Take by mouth 3 (three) times daily.      . butalbital-acetaminophen-caffeine (ESGIC) 50-325-40 MG per tablet Take 1 tablet by mouth 2 (two) times daily as needed for headache.  14 tablet  0  . desvenlafaxine (PRISTIQ) 100 MG 24 hr tablet Take 100 mg by mouth daily.      Marland Kitchen estrogens, conjugated,  (PREMARIN) 0.625 MG tablet Take 1 tablet (0.625 mg total) by mouth daily. Take daily for 21 days then do not take for 7 days.  90 tablet  2  . Multiple Vitamins-Minerals (WOMENS ONE DAILY) TABS Take 1 each by mouth daily.      Marland Kitchen PARoxetine (PAXIL) 20 MG tablet        No current facility-administered medications for this visit.    Review of Systems Review of Systems Constitutional: negative for fatigue and weight loss Respiratory: negative for cough and wheezing Cardiovascular: negative for chest pain, fatigue and palpitations Gastrointestinal: negative for abdominal pain and change in bowel habits Genitourinary: positive for low libido Integument/breast: negative for nipple discharge Musculoskeletal:negative for myalgias Neurological: negative for gait problems and tremors Behavioral/Psych: negative for abusive relationship, depression Endocrine: negative for temperature intolerance     Blood pressure 129/84, pulse 88, temperature 98 F (36.7 C), height 5\' 3"  (1.6 m), weight 58.968 kg (130 lb).  Physical Exam Physical Exam   50% of 15 min visit spent on counseling and coordination of care.   Data Reviewed None  Assessment    Menopausal symptoms improving on ERT Sexual dysfunction in the setting of antidepressant ?discontinued a trial of buspirone    Plan    Trial of Ginko Biloba Meds ordered this encounter  Medications  .  desvenlafaxine (PRISTIQ) 100 MG 24 hr tablet    Sig: Take 100 mg by mouth daily.  Marland Kitchen PARoxetine (PAXIL) 20 MG tablet    Sig:      Possible management options include: sex therapist Follow up as needed.         JACKSON-MOORE,Joshual Terrio A 11/12/2013, 9:26 PM

## 2013-11-22 ENCOUNTER — Encounter: Payer: Self-pay | Admitting: Obstetrics & Gynecology

## 2014-01-17 ENCOUNTER — Encounter: Payer: Self-pay | Admitting: *Deleted

## 2014-01-18 ENCOUNTER — Encounter: Payer: Self-pay | Admitting: Obstetrics & Gynecology

## 2014-06-17 ENCOUNTER — Ambulatory Visit (INDEPENDENT_AMBULATORY_CARE_PROVIDER_SITE_OTHER): Payer: Managed Care, Other (non HMO) | Admitting: Obstetrics

## 2014-06-17 ENCOUNTER — Encounter: Payer: Self-pay | Admitting: Obstetrics

## 2014-06-17 VITALS — BP 125/84 | HR 76 | Temp 97.7°F | Ht 62.0 in | Wt 128.0 lb

## 2014-06-17 DIAGNOSIS — Z79890 Hormone replacement therapy (postmenopausal): Secondary | ICD-10-CM | POA: Diagnosis not present

## 2014-06-17 DIAGNOSIS — N951 Menopausal and female climacteric states: Secondary | ICD-10-CM | POA: Diagnosis not present

## 2014-06-17 LAB — POCT URINALYSIS DIPSTICK
BILIRUBIN UA: NEGATIVE
GLUCOSE UA: NEGATIVE
Ketones, UA: NEGATIVE
LEUKOCYTES UA: NEGATIVE
Nitrite, UA: NEGATIVE
Protein, UA: NEGATIVE
RBC UA: NEGATIVE
SPEC GRAV UA: 1.02
Urobilinogen, UA: NEGATIVE
pH, UA: 5

## 2014-06-17 MED ORDER — ESTROGENS CONJUGATED 1.25 MG PO TABS: 1.2500 mg | ORAL_TABLET | Freq: Every day | ORAL | Status: DC

## 2014-06-17 NOTE — Addendum Note (Signed)
Addended by: Carole Binning on: 06/17/2014 03:17 PM   Modules accepted: Orders

## 2014-06-17 NOTE — Progress Notes (Signed)
Patient ID: Shelly Green, female   DOB: [birth year], 54 y.o.   MRN: [id number]  Chief Complaint  Patient presents with  . Vaginitis  . Urinary Tract Infection    HPI Shelly Green is a 54 y.o. female.  Vaginal irritation.  Also has increased hot flashes, irritability and anger.  HPI  Past Medical History  Diagnosis Date  . IBS (irritable bowel syndrome)   . Seasonal allergies   . Arthritis   . Migraines   . History of colon polyps   . Menopausal disorder   . Social anxiety disorder     Past Surgical History  Procedure Laterality Date  . Laparoscopic cholecystectomy  2007  . Total abdominal hysterectomy    . Hemorroidectomy    . Carpal tunnel release  08/20/2011    Procedure: CARPAL TUNNEL RELEASE;  Surgeon: Cammie Sickle., MD;  Location: [facility];  Service: Orthopedics;  Laterality: Right;    Family History  Problem Relation Age of Onset  . Arthritis Mother   . Colon cancer Sister 78  . Mental illness Daughter   . Mental illness Maternal Aunt   . Arthritis Maternal Grandmother   . Hypertension Maternal Grandfather   . Lymphoma Mother     Social History History  Substance Use Topics  . Smoking status: Current Every Day Smoker -- 1.50 packs/day for 35 years    Types: Cigarettes    Last Attempt to Quit: 06/21/2013  . Smokeless tobacco: Never Used  . Alcohol Use: No    Allergies  Allergen Reactions  . Codeine Anaphylaxis  . Hydrocodone Shortness Of Breath  . Penicillins Anaphylaxis  . Sulfa Antibiotics Anaphylaxis and Rash  . Tramadol Shortness Of Breath    Current Outpatient Prescriptions  Medication Sig Dispense Refill  . Amphetamine-Dextroamphetamine (AMPHETAMINE SALT COMBO PO) Take by mouth 3 (three) times daily.    . butalbital-acetaminophen-caffeine (ESGIC) 50-325-40 MG per tablet Take 1 tablet by mouth 2 (two) times daily as needed for headache. 14 tablet 0  . Multiple Vitamins-Minerals (WOMENS ONE DAILY) TABS Take 1 each  by mouth daily.    Marland Kitchen desvenlafaxine (PRISTIQ) 100 MG 24 hr tablet Take 100 mg by mouth daily.    Marland Kitchen estrogens, conjugated, (PREMARIN) 1.25 MG tablet Take 1 tablet (1.25 mg total) by mouth daily. 30 tablet 11  . PARoxetine (PAXIL) 20 MG tablet      No current facility-administered medications for this visit.    Review of Systems Review of Systems Constitutional: negative for fatigue and weight loss Respiratory: negative for cough and wheezing Cardiovascular: negative for chest pain, fatigue and palpitations Gastrointestinal: negative for abdominal pain and change in bowel habits Genitourinary:negative Integument/breast: negative for nipple discharge Musculoskeletal:negative for myalgias Neurological: negative for gait problems and tremors Behavioral/Psych: negative for abusive relationship, depression Endocrine: negative for temperature intolerance     Blood pressure 125/84, pulse 76, temperature 97.7 F (36.5 C), height 5\' 2"  (1.575 m), weight 128 lb (58.06 kg).  Physical Exam Physical Exam Abdomen:  normal findings: no organomegaly, soft, non-tender and no hernia  Pelvis:  External genitalia: normal general appearance Urinary system: urethral meatus normal and bladder without fullness, nontender Vaginal: normal without tenderness, induration or masses Cervix: absent Adnexa: normal bimanual exam Uterus: absent      Data Reviewed U/A  Assessment     Postmenopausal changes most likely from hypoestrogenemia.  S/P Hysterectomy.  She's on 0.625 mg daily.  Probably needs dose increased.    Plan  Increase Premarin to 1.25 mg daily.  Will try to lower dose after 6 months.   No orders of the defined types were placed in this encounter.   Meds ordered this encounter  Medications  . estrogens, conjugated, (PREMARIN) 1.25 MG tablet    Sig: Take 1 tablet (1.25 mg total) by mouth daily.    Dispense:  30 tablet    Refill:  11

## 2014-06-17 NOTE — Addendum Note (Signed)
Addended by: Carole Binning on: 06/17/2014 01:24 PM   Modules accepted: Orders

## 2014-06-21 LAB — SURESWAB, VAGINOSIS/VAGINITIS PLUS
Atopobium vaginae: NOT DETECTED Log (cells/mL)
BV CATEGORY: UNDETERMINED — AB
C. ALBICANS, DNA: NOT DETECTED
C. GLABRATA, DNA: NOT DETECTED
C. parapsilosis, DNA: NOT DETECTED
C. trachomatis RNA, TMA: NOT DETECTED
C. tropicalis, DNA: NOT DETECTED
GARDNERELLA VAGINALIS: 6.1 Log (cells/mL)
LACTOBACILLUS SPECIES: 8 Log (cells/mL)
MEGASPHAERA SPECIES: NOT DETECTED Log (cells/mL)
N. gonorrhoeae RNA, TMA: NOT DETECTED
T. vaginalis RNA, QL TMA: NOT DETECTED

## 2014-08-01 ENCOUNTER — Encounter: Payer: Self-pay | Admitting: Obstetrics

## 2014-08-01 ENCOUNTER — Ambulatory Visit (INDEPENDENT_AMBULATORY_CARE_PROVIDER_SITE_OTHER): Payer: Managed Care, Other (non HMO) | Admitting: Obstetrics

## 2014-08-01 VITALS — BP 136/90 | HR 100 | Temp 98.2°F | Ht 64.0 in | Wt 127.0 lb

## 2014-08-01 DIAGNOSIS — N951 Menopausal and female climacteric states: Secondary | ICD-10-CM

## 2014-08-01 DIAGNOSIS — Z79890 Hormone replacement therapy (postmenopausal): Secondary | ICD-10-CM | POA: Diagnosis not present

## 2014-08-02 ENCOUNTER — Encounter: Payer: Self-pay | Admitting: Obstetrics

## 2014-08-02 NOTE — Progress Notes (Signed)
Patient ID: Shelly Green, female   DOB: Dec 04, 1960, 54 y.o.   MRN: [id number]  Chief Complaint  Patient presents with  . Follow-up    HPI Shelly Green is a 54 y.o. female.  Hot flashes improved, Sleep also improved.  HPI  Past Medical History  Diagnosis Date  . IBS (irritable bowel syndrome)   . Seasonal allergies   . Arthritis   . Migraines   . History of colon polyps   . Menopausal disorder   . Social anxiety disorder     Past Surgical History  Procedure Laterality Date  . Laparoscopic cholecystectomy  2007  . Total abdominal hysterectomy    . Hemorroidectomy    . Carpal tunnel release  08/20/2011    Procedure: CARPAL TUNNEL RELEASE;  Surgeon: Cammie Sickle., MD;  Location: [facility];  Service: Orthopedics;  Laterality: Right;    Family History  Problem Relation Age of Onset  . Arthritis Mother   . Colon cancer Sister 23  . Mental illness Daughter   . Mental illness Maternal Aunt   . Arthritis Maternal Grandmother   . Hypertension Maternal Grandfather   . Lymphoma Mother     Social History History  Substance Use Topics  . Smoking status: Current Every Day Smoker -- 1.50 packs/day for 35 years    Types: Cigarettes    Last Attempt to Quit: 06/21/2013  . Smokeless tobacco: Never Used  . Alcohol Use: No    Allergies  Allergen Reactions  . Codeine Anaphylaxis  . Hydrocodone Shortness Of Breath  . Penicillins Anaphylaxis  . Sulfa Antibiotics Anaphylaxis and Rash  . Tramadol Shortness Of Breath    Current Outpatient Prescriptions  Medication Sig Dispense Refill  . Amphetamine-Dextroamphetamine (AMPHETAMINE SALT COMBO PO) Take by mouth 3 (three) times daily.    . butalbital-acetaminophen-caffeine (ESGIC) 50-325-40 MG per tablet Take 1 tablet by mouth 2 (two) times daily as needed for headache. 14 tablet 0  . estrogens, conjugated, (PREMARIN) 1.25 MG tablet Take 1 tablet (1.25 mg total) by mouth daily. 30 tablet 11  . desvenlafaxine  (PRISTIQ) 100 MG 24 hr tablet Take 100 mg by mouth daily.    . Multiple Vitamins-Minerals (WOMENS ONE DAILY) TABS Take 1 each by mouth daily.    Marland Kitchen PARoxetine (PAXIL) 20 MG tablet      No current facility-administered medications for this visit.    Review of Systems Review of Systems Constitutional: negative for fatigue and weight loss Respiratory: negative for cough and wheezing Cardiovascular: negative for chest pain, fatigue and palpitations Gastrointestinal: negative for abdominal pain and change in bowel habits Genitourinary:negative Integument/breast: negative for nipple discharge Musculoskeletal:negative for myalgias Neurological: negative for gait problems and tremors Behavioral/Psych: negative for abusive relationship, depression Endocrine: hot flashes, improved   Blood pressure 136/90, pulse 100, temperature 98.2 F (36.8 C), height 5\' 4"  (1.626 m), weight 127 lb (57.607 kg).  Physical Exam Physical Exam: Deferred  100% of 10 min visit spent on counseling and coordination of care.   Data Reviewed Meds  Assessment     Menopausal vasomotor symptoms.  Improved on higher dose of Premarin.       Plan    Continue Premarin 1.25 mg daily. F/U in 6 months.  No orders of the defined types were placed in this encounter.   No orders of the defined types were placed in this encounter.

## 2014-09-27 ENCOUNTER — Other Ambulatory Visit: Payer: Self-pay | Admitting: Gastroenterology

## 2014-09-28 ENCOUNTER — Inpatient Hospital Stay (HOSPITAL_COMMUNITY): Payer: Managed Care, Other (non HMO)

## 2014-09-28 ENCOUNTER — Inpatient Hospital Stay (HOSPITAL_COMMUNITY)
Admission: EM | Admit: 2014-09-28 | Discharge: 2014-09-30 | DRG: 919 | Disposition: A | Discharge status: Home / Self Care | Payer: Managed Care, Other (non HMO) | Attending: Orthopedic Surgery | Admitting: Orthopedic Surgery

## 2014-09-28 ENCOUNTER — Encounter (HOSPITAL_COMMUNITY): Payer: Self-pay | Admitting: Family Medicine

## 2014-09-28 ENCOUNTER — Emergency Department (HOSPITAL_COMMUNITY): Payer: Managed Care, Other (non HMO)

## 2014-09-28 DIAGNOSIS — D7889 Other postprocedural complications of the spleen: Secondary | ICD-10-CM | POA: Diagnosis present

## 2014-09-28 DIAGNOSIS — Z888 Allergy status to other drugs, medicaments and biological substances status: Secondary | ICD-10-CM

## 2014-09-28 DIAGNOSIS — M1990 Unspecified osteoarthritis, unspecified site: Secondary | ICD-10-CM | POA: Diagnosis present

## 2014-09-28 DIAGNOSIS — Z23 Encounter for immunization: Secondary | ICD-10-CM | POA: Diagnosis not present

## 2014-09-28 DIAGNOSIS — K661 Hemoperitoneum: Secondary | ICD-10-CM | POA: Diagnosis present

## 2014-09-28 DIAGNOSIS — R0602 Shortness of breath: Secondary | ICD-10-CM | POA: Diagnosis present

## 2014-09-28 DIAGNOSIS — Z79899 Other long term (current) drug therapy: Secondary | ICD-10-CM

## 2014-09-28 DIAGNOSIS — R188 Other ascites: Secondary | ICD-10-CM | POA: Diagnosis present

## 2014-09-28 DIAGNOSIS — R109 Unspecified abdominal pain: Secondary | ICD-10-CM | POA: Diagnosis present

## 2014-09-28 DIAGNOSIS — F17210 Nicotine dependence, cigarettes, uncomplicated: Secondary | ICD-10-CM | POA: Diagnosis present

## 2014-09-28 DIAGNOSIS — K589 Irritable bowel syndrome without diarrhea: Secondary | ICD-10-CM | POA: Diagnosis present

## 2014-09-28 DIAGNOSIS — Z885 Allergy status to narcotic agent status: Secondary | ICD-10-CM

## 2014-09-28 DIAGNOSIS — Y848 Other medical procedures as the cause of abnormal reaction of the patient, or of later complication, without mention of misadventure at the time of the procedure: Secondary | ICD-10-CM | POA: Diagnosis present

## 2014-09-28 DIAGNOSIS — K922 Gastrointestinal hemorrhage, unspecified: Secondary | ICD-10-CM

## 2014-09-28 DIAGNOSIS — R0781 Pleurodynia: Secondary | ICD-10-CM | POA: Diagnosis present

## 2014-09-28 DIAGNOSIS — Z90710 Acquired absence of both cervix and uterus: Secondary | ICD-10-CM

## 2014-09-28 DIAGNOSIS — Z880 Allergy status to penicillin: Secondary | ICD-10-CM

## 2014-09-28 DIAGNOSIS — D62 Acute posthemorrhagic anemia: Secondary | ICD-10-CM | POA: Diagnosis not present

## 2014-09-28 DIAGNOSIS — R1012 Left upper quadrant pain: Secondary | ICD-10-CM

## 2014-09-28 DIAGNOSIS — Z882 Allergy status to sulfonamides status: Secondary | ICD-10-CM

## 2014-09-28 DIAGNOSIS — S36039A Unspecified laceration of spleen, initial encounter: Secondary | ICD-10-CM | POA: Diagnosis present

## 2014-09-28 LAB — CBC
HEMATOCRIT: 33.7 % — AB (ref 36.0–46.0)
HEMATOCRIT: 32.5 % — AB (ref 36.0–46.0)
HEMOGLOBIN: 11.2 g/dL — AB (ref 12.0–15.0)
HEMOGLOBIN: 10.8 g/dL — AB (ref 12.0–15.0)
MCH: 31.1 pg (ref 26.0–34.0)
MCH: 31.2 pg (ref 26.0–34.0)
MCHC: 33.2 g/dL (ref 30.0–36.0)
MCHC: 33.2 g/dL (ref 30.0–36.0)
MCV: 93.6 fL (ref 78.0–100.0)
MCV: 93.9 fL (ref 78.0–100.0)
Platelets: 155 10*3/uL (ref 150–400)
Platelets: 158 10*3/uL (ref 150–400)
RBC: 3.6 MIL/uL — AB (ref 3.87–5.11)
RBC: 3.46 MIL/uL — AB (ref 3.87–5.11)
RDW: 12.2 % (ref 11.5–15.5)
RDW: 12.3 % (ref 11.5–15.5)
WBC: 8.8 10*3/uL (ref 4.0–10.5)
WBC: 7 10*3/uL (ref 4.0–10.5)

## 2014-09-28 LAB — CBC WITH DIFFERENTIAL/PLATELET
BASOS ABS: 0 10*3/uL (ref 0.0–0.1)
BASOS PCT: 0 % (ref 0–1)
Eosinophils Absolute: 0.2 10*3/uL (ref 0.0–0.7)
Eosinophils Relative: 2 % (ref 0–5)
HEMATOCRIT: 38 % (ref 36.0–46.0)
HEMOGLOBIN: 12.8 g/dL (ref 12.0–15.0)
LYMPHS PCT: 26 % (ref 12–46)
Lymphs Abs: 2.3 10*3/uL (ref 0.7–4.0)
MCH: 31.7 pg (ref 26.0–34.0)
MCHC: 33.7 g/dL (ref 30.0–36.0)
MCV: 94.1 fL (ref 78.0–100.0)
Monocytes Absolute: 0.7 10*3/uL (ref 0.1–1.0)
Monocytes Relative: 7 % (ref 3–12)
NEUTROS ABS: 5.8 10*3/uL (ref 1.7–7.7)
NEUTROS PCT: 65 % (ref 43–77)
Platelets: 195 10*3/uL (ref 150–400)
RBC: 4.04 MIL/uL (ref 3.87–5.11)
RDW: 12.2 % (ref 11.5–15.5)
WBC: 8.9 10*3/uL (ref 4.0–10.5)

## 2014-09-28 LAB — ABO/RH: ABO/RH(D): B POS

## 2014-09-28 LAB — COMPREHENSIVE METABOLIC PANEL
ALBUMIN: 3.5 g/dL (ref 3.5–5.0)
ALK PHOS: 66 U/L (ref 38–126)
ALT: 10 U/L — AB (ref 14–54)
AST: 18 U/L (ref 15–41)
Anion gap: 8 (ref 5–15)
BILIRUBIN TOTAL: 0.4 mg/dL (ref 0.3–1.2)
BUN: 6 mg/dL (ref 6–20)
CALCIUM: 8.5 mg/dL — AB (ref 8.9–10.3)
CO2: 24 mmol/L (ref 22–32)
CREATININE: 0.6 mg/dL (ref 0.44–1.00)
Chloride: 107 mmol/L (ref 101–111)
GFR calc Af Amer: >60 mL/min (ref >60)
GLUCOSE: 146 mg/dL — AB (ref 65–99)
Potassium: 3.2 mmol/L — ABNORMAL LOW (ref 3.5–5.1)
Sodium: 139 mmol/L (ref 135–145)
TOTAL PROTEIN: 6.1 g/dL — AB (ref 6.5–8.1)

## 2014-09-28 LAB — LIPASE, BLOOD: LIPASE: 41 U/L (ref 22–51)

## 2014-09-28 LAB — TYPE AND SCREEN
ABO/RH(D): B POS
Antibody Screen: NEGATIVE

## 2014-09-28 LAB — PROTIME-INR
INR: 1.02 (ref 0.00–1.49)
Prothrombin Time: 13.6 seconds (ref 11.6–15.2)

## 2014-09-28 LAB — APTT: aPTT: 28 seconds (ref 24–37)

## 2014-09-28 LAB — MRSA PCR SCREENING: MRSA by PCR: NEGATIVE

## 2014-09-28 MED ORDER — SODIUM CHLORIDE 0.9 % IV BOLUS (SEPSIS)
1000.0000 mL | Freq: Once | INTRAVENOUS | Status: AC
Start: 2014-09-28 — End: 2014-09-28
  Administered 2014-09-28: 1000 mL via INTRAVENOUS

## 2014-09-28 MED ORDER — HYDROMORPHONE HCL 1 MG/ML IJ SOLN
1.0000 mg | Freq: Once | INTRAMUSCULAR | Status: AC
  Administered 2014-09-28: 1 mg via INTRAVENOUS
  Filled 2014-09-28: qty 1

## 2014-09-28 MED ORDER — KCL IN DEXTROSE-NACL 20-5-0.9 MEQ/L-%-% IV SOLN
INTRAVENOUS | Status: DC
  Administered 2014-09-28 – 2014-09-29 (×2): via INTRAVENOUS
  Filled 2014-09-28 (×5): qty 1000

## 2014-09-28 MED ORDER — IOHEXOL 300 MG/ML  SOLN
100.0000 mL | Freq: Once | INTRAMUSCULAR | Status: AC | PRN
  Administered 2014-09-28: 100 mL via INTRAVENOUS

## 2014-09-28 MED ORDER — MIDAZOLAM HCL 2 MG/2ML IJ SOLN
INTRAMUSCULAR | Status: AC | PRN
  Administered 2014-09-28: 0.5 mg via INTRAVENOUS
  Administered 2014-09-28: 1 mg via INTRAVENOUS

## 2014-09-28 MED ORDER — LORAZEPAM 2 MG/ML IJ SOLN
1.0000 mg | Freq: Once | INTRAMUSCULAR | Status: AC
  Administered 2014-09-28: 1 mg via INTRAVENOUS
  Filled 2014-09-28: qty 1

## 2014-09-28 MED ORDER — SODIUM CHLORIDE 0.9 % IV SOLN
INTRAVENOUS | Status: AC | PRN
  Administered 2014-09-28: 10 mL/h via INTRAVENOUS

## 2014-09-28 MED ORDER — FENTANYL CITRATE (PF) 100 MCG/2ML IJ SOLN
25.0000 ug | INTRAMUSCULAR | Status: DC | PRN
  Administered 2014-09-28: 25 ug via INTRAVENOUS
  Administered 2014-09-28 – 2014-09-29 (×3): 50 ug via INTRAVENOUS
  Filled 2014-09-28 (×4): qty 2

## 2014-09-28 MED ORDER — ONDANSETRON HCL 4 MG PO TABS: 4.0000 mg | ORAL_TABLET | Freq: Four times a day (QID) | ORAL | Status: DC | PRN

## 2014-09-28 MED ORDER — IOHEXOL 300 MG/ML  SOLN
200.0000 mL | Freq: Once | INTRAMUSCULAR | Status: DC | PRN
  Administered 2014-09-28: 65 mL via INTRA_ARTERIAL
  Filled 2014-09-28: qty 200

## 2014-09-28 MED ORDER — FENTANYL CITRATE (PF) 100 MCG/2ML IJ SOLN
INTRAMUSCULAR | Status: AC | PRN
  Administered 2014-09-28 (×2): 25 ug via INTRAVENOUS

## 2014-09-28 MED ORDER — IOHEXOL 300 MG/ML  SOLN: 25.0000 mL | Freq: Once | INTRAMUSCULAR | Status: DC | PRN

## 2014-09-28 MED ORDER — LIDOCAINE HCL 1 % IJ SOLN
INTRAMUSCULAR | Status: AC
  Filled 2014-09-28: qty 20

## 2014-09-28 MED ORDER — ONDANSETRON HCL 4 MG/2ML IJ SOLN: 4.0000 mg | Freq: Four times a day (QID) | INTRAMUSCULAR | Status: DC | PRN

## 2014-09-28 MED ORDER — FENTANYL CITRATE (PF) 100 MCG/2ML IJ SOLN
INTRAMUSCULAR | Status: AC
  Filled 2014-09-28: qty 2

## 2014-09-28 MED ORDER — PNEUMOCOCCAL VAC POLYVALENT 25 MCG/0.5ML IJ INJ
0.5000 mL | INJECTION | INTRAMUSCULAR | Status: AC
  Administered 2014-09-29: 0.5 mL via INTRAMUSCULAR
  Filled 2014-09-28: qty 0.5

## 2014-09-28 MED ORDER — ACETAMINOPHEN 325 MG PO TABS
650.0000 mg | ORAL_TABLET | ORAL | Status: DC | PRN
  Administered 2014-09-28 – 2014-09-30 (×5): 650 mg via ORAL
  Filled 2014-09-28 (×5): qty 2

## 2014-09-28 MED ORDER — ONDANSETRON HCL 4 MG/2ML IJ SOLN
INTRAMUSCULAR | Status: AC
  Filled 2014-09-28: qty 2

## 2014-09-28 MED ORDER — MIDAZOLAM HCL 2 MG/2ML IJ SOLN
INTRAMUSCULAR | Status: AC
  Filled 2014-09-28: qty 2

## 2014-09-28 MED ORDER — ONDANSETRON HCL 4 MG/2ML IJ SOLN
4.0000 mg | Freq: Four times a day (QID) | INTRAMUSCULAR | Status: DC | PRN
  Administered 2014-09-28 (×2): 4 mg via INTRAVENOUS
  Filled 2014-09-28: qty 2

## 2014-09-28 MED ORDER — MORPHINE SULFATE (PF) 4 MG/ML IV SOLN: 4.0000 mg | Freq: Once | INTRAVENOUS | Status: DC

## 2014-09-28 MED ORDER — ACETAMINOPHEN 500 MG PO TABS
500.0000 mg | ORAL_TABLET | Freq: Once | ORAL | Status: DC
  Filled 2014-09-28: qty 1

## 2014-09-28 NOTE — ED Notes (Signed)
General surgery at bedside to assess pt.

## 2014-09-28 NOTE — ED Provider Notes (Signed)
CSN: [id number]     Arrival date & time 09/28/14  0709 History   First MD Initiated Contact with Patient 09/28/14 [phone]     Chief Complaint  Patient presents with  . Chest Pain    rib    HPI Shelly Green is a 54 y.o. female presenting to ED for left upper quadrant/left-sided rib pain. She is status post colonoscopy yesterday. States that she had abdominal pain after colonoscopy. She was told that it was gas. She was able to pass gas throughout the night with some relief. However, she woke this morning with more localized pain on the left side. She stated that they did not perform any procedures during colonoscopy. She says it hurts to to breathe. She is only comfortable when she is laying still on her right side. She is unable to lay on her back or her left side due to pain.   Past Medical History  Diagnosis Date  . IBS (irritable bowel syndrome)   . Seasonal allergies   . Arthritis   . Migraines   . History of colon polyps   . Menopausal disorder   . Social anxiety disorder    Past Surgical History  Procedure Laterality Date  . Laparoscopic cholecystectomy  2007  . Total abdominal hysterectomy    . Hemorroidectomy    . Carpal tunnel release  08/20/2011    Procedure: CARPAL TUNNEL RELEASE;  Surgeon: Cammie Sickle., MD;  Location: [facility];  Service: Orthopedics;  Laterality: Right;   Family History  Problem Relation Age of Onset  . Arthritis Mother   . Colon cancer Sister 43  . Mental illness Daughter   . Mental illness Maternal Aunt   . Arthritis Maternal Grandmother   . Hypertension Maternal Grandfather   . Lymphoma Mother    Social History  Substance Use Topics  . Smoking status: Current Every Day Smoker -- 1.50 packs/day for 35 years    Types: Cigarettes    Last Attempt to Quit: 06/21/2013  . Smokeless tobacco: Never Used  . Alcohol Use: No   OB History    Gravida Para Term Preterm AB TAB SAB Ectopic Multiple Living   4 3 3  1  1   3       Review of Systems  Constitutional: Negative for fever and chills.  Respiratory: Positive for shortness of breath.   Cardiovascular: Negative for chest pain.  Gastrointestinal: Positive for abdominal pain.  Musculoskeletal: Positive for back pain.  Skin: Negative for color change.  Neurological: Negative for dizziness and headaches.  Also per HPI  Allergies  Codeine; Hydrocodone; Penicillins; Sulfa antibiotics; Tramadol; and Tylenol  Home Medications   Prior to Admission medications   Medication Sig Start Date End Date Taking? Authorizing Provider  butalbital-acetaminophen-caffeine [facility]) 50-325-40 MG per tablet Take 1 tablet by mouth 2 (two) times daily as needed for headache. 09/28/12  Yes Lahoma Crocker, MD  estrogens, conjugated, (PREMARIN) 1.25 MG tablet Take 1 tablet (1.25 mg total) by mouth daily. 06/17/14  Yes Shelly Bombard, MD  loratadine (CLARITIN) 10 MG tablet Take 10 mg by mouth daily.   Yes Historical Provider, MD  Probiotic Product (PROBIOTIC DAILY PO) Take 1 capsule by mouth daily.   Yes Historical Provider, MD  amphetamine-dextroamphetamine (ADDERALL) 20 MG tablet Take 20 mg by mouth 3 (three) times daily. 09/13/14   Historical Provider, MD  CREON 36000 UNITS CPEP capsule Take 1-2 capsules by mouth 3 (three) times daily with meals. 09/01/14  Historical Provider, MD   BP 123/79 mmHg  Pulse 64  Temp(Src) 98 F (36.7 C)  Resp 22  SpO2 97% Physical Exam  Constitutional: She is oriented to person, place, and time. She appears well-developed and well-nourished. She appears distressed.  HENT:  Head: Normocephalic and atraumatic.  Eyes: EOM are normal.  Cardiovascular: Normal rate, regular rhythm, normal heart sounds and intact distal pulses.   Pulmonary/Chest: Effort normal and breath sounds normal.  Abdominal: Soft. Normal appearance and bowel sounds are normal. She exhibits no distension. There is no hepatosplenomegaly. There is tenderness in the left upper  quadrant. There is no CVA tenderness. A hernia is present.  Voluntary guarding  Musculoskeletal: Normal range of motion. She exhibits no edema.  Neurological: She is alert and oriented to person, place, and time.  Skin: Skin is warm and dry.  Psychiatric: She has a normal mood and affect.   ED Course  Procedures (including critical care time) Labs Review Labs Reviewed  COMPREHENSIVE METABOLIC PANEL - Abnormal; Notable for the following:    Potassium 3.2 (*)    Glucose, Bld 146 (*)    Calcium 8.5 (*)    Total Protein 6.1 (*)    ALT 10 (*)    All other components within normal limits  CBC WITH DIFFERENTIAL/PLATELET  LIPASE, BLOOD  APTT  PROTIME-INR  TYPE AND SCREEN    Imaging Review Ct Abdomen Pelvis W Contrast  09/28/2014   CLINICAL DATA:  Left lower quadrant pain, colonoscopy 09/27/2014.  EXAM: CT ABDOMEN AND PELVIS WITH CONTRAST  TECHNIQUE: Multidetector CT imaging of the abdomen and pelvis was performed using the standard protocol following bolus administration of intravenous contrast.  CONTRAST:  183mL OMNIPAQUE IOHEXOL 300 MG/ML  SOLN  COMPARISON:  08/20/2005  FINDINGS: Lung bases shows mild dependent atelectasis. Sagittal images of the spine are unremarkable. There is moderate peripelvic and perihepatic ascites. Patient is status post cholecystectomy. Mild intrahepatic biliary ductal dilatation. Perihepatic fluid measures 30 Hounsfield units in attenuation. Perisplenic fluid measures 44.5 Hounsfield units in attenuation highly suspicious for hemorrhagic fluid. There is low-attenuation lesion posterior aspect of the spleen axial image 13 and 14 Measures 1.7 cm. Findings are highly suspicious for focal splenic laceration with active contrast extravasation/hemorrhage. There is some free fluid in right paracolic gutter. Moderate pelvic ascites with hemorrhagic fluid measures 31.5 Hounsfield units in attenuation.  There is no evidence of colonic perforation. No free abdominal air. There is  a small left infraumbilical ventral hernia containing fat some fluid axial image 37 without evidence of acute complication measures about 1.8 cm.  No small bowel obstruction. Mild thickening of jejunal wall axial image 40 may represent enteritis or minimal small bowel edema. There is no evidence of small bowel obstruction. Some colonic gas noted in cecum and sigmoid colon. The urinary bladder is unremarkable. The patient is status post hysterectomy.  No pericecal inflammation.  Normal appendix.  IMPRESSION: 1. There is perihepatic and perisplenic moderate ascites. Perisplenic hemoperitoneum. Low-density lesion within posterior aspect of the spleen measures 1.7 cm highly suspicious for small splenic laceration with active contrast extravasation/hemorrhage. Perisplenic fluid measures 44.5 Hounsfield units in attenuation consistent with hemorrhagic products. Moderate pelvic ascites with hemorrhagic products. 2. There is no evidence of colonic perforation. Mild thickening of small bowel lobe may in jejunal region probable enteritis or mild small bowel wall edema. There is no evidence of small bowel obstruction. 3. Status post hysterectomy.  Critical Value/emergent results were called by telephone at the time of interpretation on  09/28/2014 at 12:04 pm to Dr. Jola Schmidt , who verbally acknowledged these results. Normal appendix. No pericecal inflammation. Some residual colonic gas post colonoscopy yesterday.   Electronically Signed   By: Lahoma Crocker M.D.   On: 09/28/2014 12:05   I have personally reviewed and evaluated these images and lab results as part of my medical decision-making.   EKG Interpretation None     MDM   Final diagnoses:  LUQ abdominal pain   Patient presents to ED for left-sided pain s/p colonoscopy. Patient with obvious distress and given Dilaudid 1mg  for pain x2 with some relief. Best relief of symptoms comes when she is not moving. CT abdomen ordered to rule out colonic perforation s/p  colonoscopy. CT with signs of splenic laceration and fluid consistent with blood. Abdominal pain most likely due to peritoneal irritation from free fluid.   Will admit patient for further management and intervention for splenic bleed. Currently hemodynamically stable and labs overall unremarkable. Will type and screen and collect coags for admitting team.   Luiz Blare, DO PGY-2, [place], DO 09/28/14 [place], MD 09/28/14 1300

## 2014-09-28 NOTE — ED Notes (Addendum)
Patient transported to CT 

## 2014-09-28 NOTE — H&P (Signed)
Chief Complaint: splenic laceration  HPI: Shelly Green is a 54 year old female with a history of IBS presenting with left sided abdominal pain which started shortly after having a routine colonoscopy yesterday with Dr. Cristina Gong.  Symptoms wre mild.  Intermittent.  Aggravated by movement and lying supine.  Alleviated by position changes.  She had dumpling soup and went to sleep.  She recalls waking up this morning with severe abdominal pain and came to the emergency department.  She denies use of anticoagulation therapy.  Denies fever, chills or sweats.  Denies nausea or vomiting.  Reports pain with inspiration.  Denies sob, dizziness or LOC.  Initial h&h at 0830 were normal, drop to 11.2/33.7 at 1200.  She has remained hemodynamically stable.  Pain now is very mild after dilaudid and the patient is drowsy, but arousable. CT of A/P perihepatic and perisplenic ascites, perisplenic hemoperitoneum, posterior aspect density suspicious for laceration with active extravasation.  We have therefore been asked to evaluate.    Past Medical History  Diagnosis Date  . IBS (irritable bowel syndrome)   . Seasonal allergies   . Arthritis   . Migraines   . History of colon polyps   . Menopausal disorder   . Social anxiety disorder     Past Surgical History  Procedure Laterality Date  . Laparoscopic cholecystectomy  2007  . Total abdominal hysterectomy    . Hemorroidectomy    . Carpal tunnel release  08/20/2011    Procedure: CARPAL TUNNEL RELEASE;  Surgeon: Cammie Sickle., MD;  Location: [place];  Service: Orthopedics;  Laterality: Right;    Family History  Problem Relation Age of Onset  . Arthritis Mother   . Colon cancer Sister 21  . Mental illness Daughter   . Mental illness Maternal Aunt   . Arthritis Maternal Grandmother   . Hypertension Maternal Grandfather   . Lymphoma Mother    Social History:  reports that she has been smoking Cigarettes.  She has a 52.5 pack-year  smoking history. She has never used smokeless tobacco. She reports that she does not drink alcohol or use illicit drugs.  Allergies:  Allergies  Allergen Reactions  . Codeine Anaphylaxis  . Hydrocodone Shortness Of Breath  . Penicillins Anaphylaxis  . Sulfa Antibiotics Anaphylaxis and Rash  . Tramadol Shortness Of Breath   Medication: Prior to Admission medications   Medication Sig Start Date End Date Taking? Authorizing Provider  butalbital-acetaminophen-caffeine [facility]) 50-325-40 MG per tablet Take 1 tablet by mouth 2 (two) times daily as needed for headache. 09/28/12  Yes Lahoma Crocker, MD  estrogens, conjugated, (PREMARIN) 1.25 MG tablet Take 1 tablet (1.25 mg total) by mouth daily. 06/17/14  Yes Shelly Bombard, MD  loratadine (CLARITIN) 10 MG tablet Take 10 mg by mouth daily.   Yes Historical Provider, MD  Probiotic Product (PROBIOTIC DAILY PO) Take 1 capsule by mouth daily.   Yes Historical Provider, MD  amphetamine-dextroamphetamine (ADDERALL) 20 MG tablet Take 20 mg by mouth 3 (three) times daily. 09/13/14   Historical Provider, MD  CREON 36000 UNITS CPEP capsule Take 1-2 capsules by mouth 3 (three) times daily with meals. 09/01/14   Historical Provider, MD      (Not in a hospital admission)  Results for orders placed or performed during the hospital encounter of 09/28/14 (from the past 48 hour(s))  CBC with Differential/Platelet     Status: None   Collection Time: 09/28/14  8:35 AM  Result Value Ref Range  WBC 8.9 4.0 - 10.5 K/uL   RBC 4.04 3.87 - 5.11 MIL/uL   Hemoglobin 12.8 12.0 - 15.0 g/dL   HCT 38.0 36.0 - 46.0 %   MCV 94.1 78.0 - 100.0 fL   MCH 31.7 26.0 - 34.0 pg   MCHC 33.7 30.0 - 36.0 g/dL   RDW 12.2 11.5 - 15.5 %   Platelets 195 150 - 400 K/uL   Neutrophils Relative % 65 43 - 77 %   Neutro Abs 5.8 1.7 - 7.7 K/uL   Lymphocytes Relative 26 12 - 46 %   Lymphs Abs 2.3 0.7 - 4.0 K/uL   Monocytes Relative 7 3 - 12 %   Monocytes Absolute 0.7 0.1 - 1.0 K/uL    Eosinophils Relative 2 0 - 5 %   Eosinophils Absolute 0.2 0.0 - 0.7 K/uL   Basophils Relative 0 0 - 1 %   Basophils Absolute 0.0 0.0 - 0.1 K/uL  Comprehensive metabolic panel     Status: Abnormal   Collection Time: 09/28/14  8:35 AM  Result Value Ref Range   Sodium 139 135 - 145 mmol/L   Potassium 3.2 (L) 3.5 - 5.1 mmol/L   Chloride 107 101 - 111 mmol/L   CO2 24 22 - 32 mmol/L   Glucose, Bld 146 (H) 65 - 99 mg/dL   BUN 6 6 - 20 mg/dL   Creatinine, Ser 0.60 0.44 - 1.00 mg/dL   Calcium 8.5 (L) 8.9 - 10.3 mg/dL   Total Protein 6.1 (L) 6.5 - 8.1 g/dL   Albumin 3.5 3.5 - 5.0 g/dL   AST 18 15 - 41 U/L   ALT 10 (L) 14 - 54 U/L   Alkaline Phosphatase 66 38 - 126 U/L   Total Bilirubin 0.4 0.3 - 1.2 mg/dL   GFR calc non Af Amer >60 >60 mL/min   GFR calc Af Amer >60 >60 mL/min    Comment: (NOTE) The eGFR has been calculated using the CKD EPI equation. This calculation has not been validated in all clinical situations. eGFR's persistently <60 mL/min signify possible Chronic Kidney Disease.    Anion gap 8 5 - 15  Lipase, blood     Status: None   Collection Time: 09/28/14  8:35 AM  Result Value Ref Range   Lipase 41 22 - 51 U/L  CBC     Status: Abnormal   Collection Time: 09/28/14 12:08 PM  Result Value Ref Range   WBC 8.8 4.0 - 10.5 K/uL   RBC 3.60 (L) 3.87 - 5.11 MIL/uL   Hemoglobin 11.2 (L) 12.0 - 15.0 g/dL   HCT 33.7 (L) 36.0 - 46.0 %   MCV 93.6 78.0 - 100.0 fL   MCH 31.1 26.0 - 34.0 pg   MCHC 33.2 30.0 - 36.0 g/dL   RDW 12.2 11.5 - 15.5 %   Platelets 155 150 - 400 K/uL   Ct Abdomen Pelvis W Contrast  09/28/2014   CLINICAL DATA:  Left lower quadrant pain, colonoscopy 09/27/2014.  EXAM: CT ABDOMEN AND PELVIS WITH CONTRAST  TECHNIQUE: Multidetector CT imaging of the abdomen and pelvis was performed using the standard protocol following bolus administration of intravenous contrast.  CONTRAST:  165mL OMNIPAQUE IOHEXOL 300 MG/ML  SOLN  COMPARISON:  08/20/2005  FINDINGS: Lung bases  shows mild dependent atelectasis. Sagittal images of the spine are unremarkable. There is moderate peripelvic and perihepatic ascites. Patient is status post cholecystectomy. Mild intrahepatic biliary ductal dilatation. Perihepatic fluid measures 30 Hounsfield units in attenuation. Perisplenic  fluid measures 44.5 Hounsfield units in attenuation highly suspicious for hemorrhagic fluid. There is low-attenuation lesion posterior aspect of the spleen axial image 13 and 14 Measures 1.7 cm. Findings are highly suspicious for focal splenic laceration with active contrast extravasation/hemorrhage. There is some free fluid in right paracolic gutter. Moderate pelvic ascites with hemorrhagic fluid measures 31.5 Hounsfield units in attenuation.  There is no evidence of colonic perforation. No free abdominal air. There is a small left infraumbilical ventral hernia containing fat some fluid axial image 37 without evidence of acute complication measures about 1.8 cm.  No small bowel obstruction. Mild thickening of jejunal wall axial image 40 may represent enteritis or minimal small bowel edema. There is no evidence of small bowel obstruction. Some colonic gas noted in cecum and sigmoid colon. The urinary bladder is unremarkable. The patient is status post hysterectomy.  No pericecal inflammation.  Normal appendix.  IMPRESSION: 1. There is perihepatic and perisplenic moderate ascites. Perisplenic hemoperitoneum. Low-density lesion within posterior aspect of the spleen measures 1.7 cm highly suspicious for small splenic laceration with active contrast extravasation/hemorrhage. Perisplenic fluid measures 44.5 Hounsfield units in attenuation consistent with hemorrhagic products. Moderate pelvic ascites with hemorrhagic products. 2. There is no evidence of colonic perforation. Mild thickening of small bowel lobe may in jejunal region probable enteritis or mild small bowel wall edema. There is no evidence of small bowel obstruction. 3.  Status post hysterectomy.  Critical Value/emergent results were called by telephone at the time of interpretation on 09/28/2014 at 12:04 pm to Dr. Jola Schmidt , who verbally acknowledged these results. Normal appendix. No pericecal inflammation. Some residual colonic gas post colonoscopy yesterday.   Electronically Signed   By: Lahoma Crocker M.D.   On: 09/28/2014 12:05    Review of Systems  All other systems reviewed and are negative.   Blood pressure 112/75, pulse 80, temperature 98 F (36.7 C), resp. rate 22, SpO2 92 %. Physical Exam  Constitutional: She is oriented to person, place, and time. She appears well-developed and well-nourished. No distress.  Cardiovascular: Normal rate, normal heart sounds and intact distal pulses.  Exam reveals no gallop and no friction rub.   No murmur heard. Respiratory: Effort normal and breath sounds normal. No respiratory distress. She has no wheezes. She has no rales. She exhibits no tenderness.  GI: Soft. Bowel sounds are normal. She exhibits no distension and no mass. There is no guarding.  TTP LUQ with mild TTP LLQ  Musculoskeletal: Normal range of motion. She exhibits no edema or tenderness.  Neurological: She is alert and oriented to person, place, and time.  Skin: Skin is warm and dry. No rash noted. She is not diaphoretic. No erythema. No pallor.  Psychiatric: She has a normal mood and affect. Her behavior is normal. Judgment and thought content normal.     Assessment/Plan S/p colonoscopy 9/6-no free air on CT Iatrogenic spleen laceration with active extravasation/hemoperitoneum-consult to IR for angio.  NPO, IVF, serial H&H's.  I suspect H&H will drop given amount of hemoperitoneum.  Transfuse for hgb <7 or symptomatic VTE prophylaxis-SCDs only FEN-NPO, IVF, try fentanyl Dispo-IR then admit to ICU  Meg Niemeier, [place] ANP-BC 09/28/2014, 12:34 PM

## 2014-09-28 NOTE — Sedation Documentation (Signed)
Pt transported to 52M with IR RN and RN tech on monitor, report given to Mar RN

## 2014-09-28 NOTE — Sedation Documentation (Signed)
Patient denies pain and is resting comfortably.  

## 2014-09-28 NOTE — Sedation Documentation (Signed)
Patient is resting comfortably. 

## 2014-09-28 NOTE — ED Notes (Signed)
MD Campos at bedside discussing scan results with pt and plan of care. Pt resting. NAD. VSS.

## 2014-09-28 NOTE — ED Notes (Signed)
Per CT staff pt unable to tolerate laying on back for CT; EDP alerted and to give additional meds

## 2014-09-28 NOTE — ED Provider Notes (Signed)
12:14 PM Spoke with general surgery who will evaluate at bedside now. Repeat hgb. Type and screen. Coagulation studies. HR 74. SBP 112. Comfortable at this time. Family and pt updated  Jola Schmidt, MD 09/28/14 1215

## 2014-09-28 NOTE — ED Notes (Signed)
Pt sts that yesterday she had a colonoscopy and since she has been having spasms under her left rib and pain with breathing

## 2014-09-28 NOTE — Consult Note (Signed)
Chief Complaint: Patient was seen in consultation today for splenic laceration Chief Complaint  Patient presents with  . Chest Pain    rib   at the request of Dr Georganna Skeans  Referring Physician(s): Trauma MD  History of Present Illness: Shelly Green is a 54 y.o. female   OP colonoscopy 9/6  Developed worsening abdominal pain overnight Came to ED this am CT today: IMPRESSION: 1. There is perihepatic and perisplenic moderate ascites. Perisplenic hemoperitoneum. Low-density lesion within posterior aspect of the spleen measures 1.7 cm highly suspicious for small splenic laceration with active contrast extravasation/hemorrhage. Perisplenic fluid measures 44.5 Hounsfield units in attenuation consistent with hemorrhagic products. Moderate pelvic ascites with hemorrhagic products. 2. There is no evidence of colonic perforation. Mild thickening of small bowel lobe may in jejunal region probable enteritis or mild small bowel wall edema. There is no evidence of small bowel obstruction.  Request for arteriogram with possible embolization of splenic artery Dr Kathlene Cote has reviewed imaging and approves procedure I have seen and examined pt  Past Medical History  Diagnosis Date  . IBS (irritable bowel syndrome)   . Seasonal allergies   . Arthritis   . Migraines   . History of colon polyps   . Menopausal disorder   . Social anxiety disorder     Past Surgical History  Procedure Laterality Date  . Laparoscopic cholecystectomy  2007  . Total abdominal hysterectomy    . Hemorroidectomy    . Carpal tunnel release  08/20/2011    Procedure: CARPAL TUNNEL RELEASE;  Surgeon: Cammie Sickle., MD;  Location: [place];  Service: Orthopedics;  Laterality: Right;    Allergies: Codeine; Hydrocodone; Penicillins; Sulfa antibiotics; and Tramadol  Medications: Prior to Admission medications   Medication Sig Start Date End Date Taking? Authorizing Provider    butalbital-acetaminophen-caffeine [facility]) 50-325-40 MG per tablet Take 1 tablet by mouth 2 (two) times daily as needed for headache. 09/28/12  Yes Lahoma Crocker, MD  estrogens, conjugated, (PREMARIN) 1.25 MG tablet Take 1 tablet (1.25 mg total) by mouth daily. 06/17/14  Yes Shelly Bombard, MD  loratadine (CLARITIN) 10 MG tablet Take 10 mg by mouth daily.   Yes Historical Provider, MD  Probiotic Product (PROBIOTIC DAILY PO) Take 1 capsule by mouth daily.   Yes Historical Provider, MD  amphetamine-dextroamphetamine (ADDERALL) 20 MG tablet Take 20 mg by mouth 3 (three) times daily. 09/13/14   Historical Provider, MD  CREON 36000 UNITS CPEP capsule Take 1-2 capsules by mouth 3 (three) times daily with meals. 09/01/14   Historical Provider, MD     Family History  Problem Relation Age of Onset  . Arthritis Mother   . Colon cancer Sister 51  . Mental illness Daughter   . Mental illness Maternal Aunt   . Arthritis Maternal Grandmother   . Hypertension Maternal Grandfather   . Lymphoma Mother     Social History   Social History  . Marital Status: Married    Spouse Name: N/A  . Number of Children: N/A  . Years of Education: N/A   Social History Main Topics  . Smoking status: Current Every Day Smoker -- 1.50 packs/day for 35 years    Types: Cigarettes    Last Attempt to Quit: 06/21/2013  . Smokeless tobacco: Never Used  . Alcohol Use: No  . Drug Use: No  . Sexual Activity: Yes   Other Topics Concern  . None   Social History Narrative  Review of Systems: A 12 point ROS discussed and pertinent positives are indicated in the HPI above.  All other systems are negative.  Review of Systems  Constitutional: Positive for activity change and appetite change.  Respiratory: Negative for cough and shortness of breath.   Gastrointestinal: Positive for nausea and abdominal pain.  Musculoskeletal: Positive for back pain.  Neurological: Positive for weakness.  Psychiatric/Behavioral:  Negative for behavioral problems and confusion.    Vital Signs: BP 112/71 mmHg  Pulse 72  Temp(Src) 98 F (36.7 C)  Resp 22  SpO2 93%  Physical Exam  Constitutional: She is oriented to person, place, and time.  Cardiovascular: Normal rate, regular rhythm and normal heart sounds.   Pulmonary/Chest: Effort normal and breath sounds normal. She has no wheezes.  Abdominal: Soft. Bowel sounds are normal. There is tenderness. There is guarding.  Left flank pain  Musculoskeletal: Normal range of motion.  Neurological: She is alert and oriented to person, place, and time.  Groggy from meds  Skin: Skin is warm and dry.  Psychiatric: She has a normal mood and affect. Thought content normal.  Consented with husband at bedside  Nursing note and vitals reviewed.   Mallampati Score:  MD Evaluation Airway: WNL Heart: WNL Abdomen: WNL Chest/ Lungs: WNL ASA  Classification: 3 Mallampati/Airway Score: One  Imaging: Ct Abdomen Pelvis W Contrast  09/28/2014   CLINICAL DATA:  Left lower quadrant pain, colonoscopy 09/27/2014.  EXAM: CT ABDOMEN AND PELVIS WITH CONTRAST  TECHNIQUE: Multidetector CT imaging of the abdomen and pelvis was performed using the standard protocol following bolus administration of intravenous contrast.  CONTRAST:  114mL OMNIPAQUE IOHEXOL 300 MG/ML  SOLN  COMPARISON:  08/20/2005  FINDINGS: Lung bases shows mild dependent atelectasis. Sagittal images of the spine are unremarkable. There is moderate peripelvic and perihepatic ascites. Patient is status post cholecystectomy. Mild intrahepatic biliary ductal dilatation. Perihepatic fluid measures 30 Hounsfield units in attenuation. Perisplenic fluid measures 44.5 Hounsfield units in attenuation highly suspicious for hemorrhagic fluid. There is low-attenuation lesion posterior aspect of the spleen axial image 13 and 14 Measures 1.7 cm. Findings are highly suspicious for focal splenic laceration with active contrast  extravasation/hemorrhage. There is some free fluid in right paracolic gutter. Moderate pelvic ascites with hemorrhagic fluid measures 31.5 Hounsfield units in attenuation.  There is no evidence of colonic perforation. No free abdominal air. There is a small left infraumbilical ventral hernia containing fat some fluid axial image 37 without evidence of acute complication measures about 1.8 cm.  No small bowel obstruction. Mild thickening of jejunal wall axial image 40 may represent enteritis or minimal small bowel edema. There is no evidence of small bowel obstruction. Some colonic gas noted in cecum and sigmoid colon. The urinary bladder is unremarkable. The patient is status post hysterectomy.  No pericecal inflammation.  Normal appendix.  IMPRESSION: 1. There is perihepatic and perisplenic moderate ascites. Perisplenic hemoperitoneum. Low-density lesion within posterior aspect of the spleen measures 1.7 cm highly suspicious for small splenic laceration with active contrast extravasation/hemorrhage. Perisplenic fluid measures 44.5 Hounsfield units in attenuation consistent with hemorrhagic products. Moderate pelvic ascites with hemorrhagic products. 2. There is no evidence of colonic perforation. Mild thickening of small bowel lobe may in jejunal region probable enteritis or mild small bowel wall edema. There is no evidence of small bowel obstruction. 3. Status post hysterectomy.  Critical Value/emergent results were called by telephone at the time of interpretation on 09/28/2014 at 12:04 pm to Dr. Jola Schmidt , who verbally  acknowledged these results. Normal appendix. No pericecal inflammation. Some residual colonic gas post colonoscopy yesterday.   Electronically Signed   By: Lahoma Crocker M.D.   On: 09/28/2014 12:05    Labs:  CBC:  Recent Labs  09/28/14 0835 09/28/14 1208  WBC 8.9 8.8  HGB 12.8 11.2*  HCT 38.0 33.7*  PLT 195 155    COAGS:  Recent Labs  09/28/14 1208  INR 1.02  APTT 28     BMP:  Recent Labs  09/28/14 0835  NA 139  K 3.2*  CL 107  CO2 24  GLUCOSE 146*  BUN 6  CALCIUM 8.5*  CREATININE 0.60  GFRNONAA >60  GFRAA >60    LIVER FUNCTION TESTS:  Recent Labs  09/28/14 0835  BILITOT 0.4  AST 18  ALT 10*  ALKPHOS 66  PROT 6.1*  ALBUMIN 3.5    TUMOR MARKERS: No results for input(s): AFPTM, CEA, CA199, CHROMGRNA in the last 8760 hours.  Assessment and Plan:  Splenic injury/laceration; hemoperitoneum secondary colonoscopy 9/6 Now scheduled for mesenteric arteriogram with probable splenic artery embolization Risks and Benefits discussed with the patient including, but not limited to bleeding, infection, vascular injury or contrast induced renal failure. All of the patient's questions were answered, patient is agreeable to proceed. Consent signed and in chart.  Thank you for this interesting consult.  I greatly enjoyed meeting DEISY OZBUN and look forward to participating in their care.  A copy of this report was sent to the requesting provider on this date.  Signed: Tametha Banning A 09/28/2014, 1:26 PM   I spent a total of 40 Minutes    in face to face in clinical consultation, greater than 50% of which was counseling/coordinating care for splenic arteriogram/embolization

## 2014-09-28 NOTE — Procedures (Signed)
Interventional Radiology Procedure Note  Procedure:  Celiac and splenic arteriography  Complications:  None  Estimated Blood Loss:  < 25 mL  Findings:  Right common femoral artery access.  Celiac and selective splenic arteriography shows no active contrast extravasation from splenic artery or parenchyma.  Slightly irregular vessels in medial/upper spleen in same region as laceration by CT.  Could not selectively get into branch vessel at this level to further investigate or possibly embolize focally.  No indication by arteriography to occlude entire splenic artery supply.  Findings discussed with Dr. Hulen Skains.  Venetia Night. Kathlene Cote, M.D Pager:  [phone]

## 2014-09-29 LAB — BASIC METABOLIC PANEL
ANION GAP: 5 (ref 5–15)
BUN: <5 mg/dL — ABNORMAL LOW (ref 6–20)
CHLORIDE: 107 mmol/L (ref 101–111)
CO2: 26 mmol/L (ref 22–32)
Calcium: 8.3 mg/dL — ABNORMAL LOW (ref 8.9–10.3)
Creatinine, Ser: 0.51 mg/dL (ref 0.44–1.00)
GFR calc non Af Amer: >60 mL/min (ref >60)
Glucose, Bld: 130 mg/dL — ABNORMAL HIGH (ref 65–99)
Potassium: 3.6 mmol/L (ref 3.5–5.1)
Sodium: 138 mmol/L (ref 135–145)

## 2014-09-29 LAB — CBC
HCT: 31.9 % — ABNORMAL LOW (ref 36.0–46.0)
HEMATOCRIT: 31.2 % — AB (ref 36.0–46.0)
HEMOGLOBIN: 10.8 g/dL — AB (ref 12.0–15.0)
HEMOGLOBIN: 10.6 g/dL — AB (ref 12.0–15.0)
MCH: 32.5 pg (ref 26.0–34.0)
MCH: 31 pg (ref 26.0–34.0)
MCHC: 33.2 g/dL (ref 30.0–36.0)
MCHC: 34.6 g/dL (ref 30.0–36.0)
MCV: 94 fL (ref 78.0–100.0)
MCV: 93.3 fL (ref 78.0–100.0)
Platelets: 150 10*3/uL (ref 150–400)
Platelets: 156 10*3/uL (ref 150–400)
RBC: 3.32 MIL/uL — ABNORMAL LOW (ref 3.87–5.11)
RBC: 3.42 MIL/uL — AB (ref 3.87–5.11)
RDW: 12.4 % (ref 11.5–15.5)
RDW: 12.4 % (ref 11.5–15.5)
WBC: 12.1 10*3/uL — ABNORMAL HIGH (ref 4.0–10.5)
WBC: 8.3 10*3/uL (ref 4.0–10.5)

## 2014-09-29 MED ORDER — TRAMADOL HCL 50 MG PO TABS: 50.0000 mg | ORAL_TABLET | Freq: Four times a day (QID) | ORAL | Status: DC | PRN

## 2014-09-29 MED ORDER — TRAMADOL HCL 50 MG PO TABS
50.0000 mg | ORAL_TABLET | Freq: Four times a day (QID) | ORAL | Status: DC | PRN
  Administered 2014-09-30: 50 mg via ORAL
  Filled 2014-09-29: qty 1

## 2014-09-29 NOTE — Progress Notes (Signed)
Patient ID: Shelly Green, female   DOB: 05-01-1960, 54 y.o.   MRN: [id number]  LOS: 1 day   Subjective: No n/v.  Passing flatus.  Sore, but pain is overall better.  H&h drifted down some, but remains stable.  Hemodynamically stable.    Objective: Vital signs in last 24 hours: Temp:  [97.6 F (36.4 C)-98.5 F (36.9 C)] 98.5 F (36.9 C) (09/08 0746) Pulse Rate:  [51-85] 63 (09/08 0800) Resp:  [8-23] 11 (09/08 0800) BP: (102-148)/(59-105) 127/66 mmHg (09/08 0800) SpO2:  [92 %-100 %] 96 % (09/08 0800)    Lab Results:  CBC  Recent Labs  09/29/14 0037 09/29/14 0620  WBC 12.1* 8.3  HGB 10.8* 10.6*  HCT 31.2* 31.9*  PLT 150 156   BMET  Recent Labs  09/28/14 0835 09/29/14 0620  NA 139 138  K 3.2* 3.6  CL 107 107  CO2 24 26  GLUCOSE 146* 130*  BUN 6 <5*  CREATININE 0.60 0.51  CALCIUM 8.5* 8.3*    Imaging: Ct Abdomen Pelvis W Contrast  09/28/2014   CLINICAL DATA:  Left lower quadrant pain, colonoscopy 09/27/2014.  EXAM: CT ABDOMEN AND PELVIS WITH CONTRAST  TECHNIQUE: Multidetector CT imaging of the abdomen and pelvis was performed using the standard protocol following bolus administration of intravenous contrast.  CONTRAST:  151mL OMNIPAQUE IOHEXOL 300 MG/ML  SOLN  COMPARISON:  08/20/2005  FINDINGS: Lung bases shows mild dependent atelectasis. Sagittal images of the spine are unremarkable. There is moderate peripelvic and perihepatic ascites. Patient is status post cholecystectomy. Mild intrahepatic biliary ductal dilatation. Perihepatic fluid measures 30 Hounsfield units in attenuation. Perisplenic fluid measures 44.5 Hounsfield units in attenuation highly suspicious for hemorrhagic fluid. There is low-attenuation lesion posterior aspect of the spleen axial image 13 and 14 Measures 1.7 cm. Findings are highly suspicious for focal splenic laceration with active contrast extravasation/hemorrhage. There is some free fluid in right paracolic gutter. Moderate pelvic ascites with  hemorrhagic fluid measures 31.5 Hounsfield units in attenuation.  There is no evidence of colonic perforation. No free abdominal air. There is a small left infraumbilical ventral hernia containing fat some fluid axial image 37 without evidence of acute complication measures about 1.8 cm.  No small bowel obstruction. Mild thickening of jejunal wall axial image 40 may represent enteritis or minimal small bowel edema. There is no evidence of small bowel obstruction. Some colonic gas noted in cecum and sigmoid colon. The urinary bladder is unremarkable. The patient is status post hysterectomy.  No pericecal inflammation.  Normal appendix.  IMPRESSION: 1. There is perihepatic and perisplenic moderate ascites. Perisplenic hemoperitoneum. Low-density lesion within posterior aspect of the spleen measures 1.7 cm highly suspicious for small splenic laceration with active contrast extravasation/hemorrhage. Perisplenic fluid measures 44.5 Hounsfield units in attenuation consistent with hemorrhagic products. Moderate pelvic ascites with hemorrhagic products. 2. There is no evidence of colonic perforation. Mild thickening of small bowel lobe may in jejunal region probable enteritis or mild small bowel wall edema. There is no evidence of small bowel obstruction. 3. Status post hysterectomy.  Critical Value/emergent results were called by telephone at the time of interpretation on 09/28/2014 at 12:04 pm to Dr. Jola Schmidt , who verbally acknowledged these results. Normal appendix. No pericecal inflammation. Some residual colonic gas post colonoscopy yesterday.   Electronically Signed   By: Lahoma Crocker M.D.   On: 09/28/2014 12:05     PE: General appearance: alert, cooperative and no distress Resp: clear to auscultation bilaterally Cardio: regular rate  and rhythm, S1, S2 normal, no murmur, click, rub or gallop GI: +bs, abdomen is soft, TTP left abdomen.  No guarding.   Extremities: extremities normal, atraumatic, no cyanosis  or edema      Patient Active Problem List   Diagnosis Date Noted  . Spleen laceration 09/28/2014  . Splenic laceration   . Low libido 11/12/2013  . Abdominal pain, unspecified site 09/24/2013  . Chronic LBP 09/24/2013  . Tobacco use disorder 09/24/2013  . Social anxiety disorder   . Menopause syndrome 09/28/2012     Assessment/Plan: S/p colonoscopy 9/6 Iatrogenic spleen laceration with active extravasation/hemoperitoneum-s/p IR angio, no active bleeding.  Pt remains stable.  Advance diet, repeat CBC in AM and start mobilization ABL anemia-see above  VTE prophylaxis-SCDs only FEN-SLIV, try tramadol for pain, not allergic, tylenol Dispo-to floor.    Erby Pian, ANP-BC Pager: [phone] General Trauma PA Pager: [phone]   09/29/2014 10:08 AM

## 2014-09-29 NOTE — Care Management Note (Signed)
Case Management Note  Patient Details  Name: CHANDREA ZELLMAN MRN: [id number] Date of Birth: 1960-09-20  Subjective/Objective:   Pt admitted on 09/28/14 with hemoperitoneum/spleen laceration after colonoscopy.   PTA, pt independent, lives with spouse.                 Action/Plan: Will follow for discharge needs as pt progresses.    Expected Discharge Date:                  Expected Discharge Plan:  Home/Self Care  In-House Referral:     Discharge planning Services  CM Consult  Post Acute Care Choice:    Choice offered to:     DME Arranged:    DME Agency:     HH Arranged:    HH Agency:     Status of Service:  In process, will continue to follow  Medicare Important Message Given:    Date Medicare IM Given:    Medicare IM give by:    Date Additional Medicare IM Given:    Additional Medicare Important Message give by:     If discussed at [place] of Stay Meetings, dates discussed:    Additional Comments:  Reinaldo Raddle, RN, BSN  Trauma/Neuro ICU Case Manager [phone]

## 2014-09-30 DIAGNOSIS — D62 Acute posthemorrhagic anemia: Secondary | ICD-10-CM | POA: Diagnosis present

## 2014-09-30 LAB — CBC
HEMATOCRIT: 32 % — AB (ref 36.0–46.0)
HEMATOCRIT: 31.8 % — AB (ref 36.0–46.0)
HEMOGLOBIN: 10.9 g/dL — AB (ref 12.0–15.0)
Hemoglobin: 10.6 g/dL — ABNORMAL LOW (ref 12.0–15.0)
MCH: 31.9 pg (ref 26.0–34.0)
MCH: 31 pg (ref 26.0–34.0)
MCHC: 34.1 g/dL (ref 30.0–36.0)
MCHC: 33.3 g/dL (ref 30.0–36.0)
MCV: 93.6 fL (ref 78.0–100.0)
MCV: 93 fL (ref 78.0–100.0)
Platelets: 156 10*3/uL (ref 150–400)
Platelets: 158 10*3/uL (ref 150–400)
RBC: 3.42 MIL/uL — AB (ref 3.87–5.11)
RBC: 3.42 MIL/uL — ABNORMAL LOW (ref 3.87–5.11)
RDW: 12.4 % (ref 11.5–15.5)
RDW: 12.4 % (ref 11.5–15.5)
WBC: 9.2 10*3/uL (ref 4.0–10.5)
WBC: 8.4 10*3/uL (ref 4.0–10.5)

## 2014-09-30 MED ORDER — TRAMADOL HCL 50 MG PO TABS: 50.0000 mg | ORAL_TABLET | Freq: Four times a day (QID) | ORAL | Status: DC | PRN

## 2014-09-30 NOTE — Discharge Summary (Signed)
Physician Discharge Summary  Patient ID: Shelly Green MRN: [id number] DOB/AGE: 06-05-1960 54 y.o.  Admit date: 09/28/2014 Discharge date: 09/30/2014  Discharge Diagnoses Patient Active Problem List   Diagnosis Date Noted  . Acute blood loss anemia 09/30/2014  . Spleen laceration 09/28/2014  . Splenic laceration   . Low libido 11/12/2013  . Abdominal pain, unspecified site 09/24/2013  . Chronic LBP 09/24/2013  . Tobacco use disorder 09/24/2013  . Social anxiety disorder   . Menopause syndrome 09/28/2012    Consultants Dr. Aletta Green for interventional radiology   Procedures 9/7 -- Celiac and splenic arteriography by Dr. Kathlene Green   HPI: Shelly Green presented with left sided abdominal pain which started shortly after having a routine colonoscopy  with Dr. Cristina Green the prior to admission. She recalled waking up the following morning with severe abdominal pain and came to the emergency department. Her initial h&h at 0830 was normal but dropped to 11.2/33.7 at 1200.A CT of her abdomen and pelvis showed perihepatic and perisplenic ascites, perisplenic hemoperitoneum, and a posterior aspect density in her spleen suspicious for laceration with active extravasation.She was admitted by the trauma service and interventional radiology was consulted.   Hospital Course: Her angiography did not demonstrate any areas of active bleeding. She was monitored in the ICU and after an initial drop in her hemoglobin to the mid-10's she stabilized there and did not require any transfusions. Her pain was controlled on oral medications and she was mobilized. Her symptoms, labs, and vital signs remained stable and she was discharged home in good condition.     Medication List    TAKE these medications        amphetamine-dextroamphetamine 20 MG tablet  Commonly known as:  ADDERALL  Take 20 mg by mouth 3 (three) times daily.     butalbital-acetaminophen-caffeine 50-325-40 MG per tablet  Commonly known  as:  ESGIC  Take 1 tablet by mouth 2 (two) times daily as needed for headache.     CREON 36000 UNITS Cpep capsule  Generic drug:  lipase/protease/amylase  Take 1-2 capsules by mouth 3 (three) times daily with meals.     estrogens (conjugated) 1.25 MG tablet  Commonly known as:  PREMARIN  Take 1 tablet (1.25 mg total) by mouth daily.     loratadine 10 MG tablet  Commonly known as:  CLARITIN  Take 10 mg by mouth daily.     PROBIOTIC DAILY PO  Take 1 capsule by mouth daily.     traMADol 50 MG tablet  Commonly known as:  ULTRAM  Take 1 tablet (50 mg total) by mouth every 6 (six) hours as needed for moderate pain or severe pain.            Follow-up Information    Call [facility].   Why:  As needed   Contact information:   [address] [place] [address] [phone]       Signed: Lisette Abu, PA-C Pager: [phone] General Trauma PA Pager: [phone] 09/30/2014, 3:48 PM

## 2014-09-30 NOTE — Clinical Social Work Note (Signed)
Clinical Social Work Assessment  Patient Details  Name: Shelly Green MRN: [id number] Date of Birth: [birth year]  Date of referral:  09/30/14               Reason for consult:  Trauma                Permission sought to share information with:    Permission granted to share information::     Name::        Agency::     Relationship::     Contact Information:     Housing/Transportation Living arrangements for the past 2 months:  Single Family Home Source of Information:  Patient Patient Interpreter Needed:  None Criminal Activity/Legal Involvement Pertinent to Current Situation/Hospitalization:  No - Comment as needed Significant Relationships:  Spouse Lives with:  Spouse Do you feel safe going back to the place where you live?  Yes Need for family participation in patient care:  No (Coment)  Care giving concerns: Patient does not have any concerns about returning back home.   Social Worker assessment / plan:  Patient is a 54 year old female who lives with her husband.  Patient is alert and oriented x4, and her plan is to return back home with her husband.  Patient did not express any concerns or issues, she is hopeful that she will be able to return back home today.  Patient did not have any other questions for CSW.  Employment status:  Software engineer:  Managed Care PT Recommendations:  Not assessed at this time Information / Referral to community resources:     Patient/Family's Response to care: Patient is able to make her own decisions and the plan is to return back home with her husband.  Patient/Family's Understanding of and Emotional Response to Diagnosis, Current Treatment, and Prognosis: Patient is aware of her diagnosis and current treatment plan.  Patient is hopeful she will be able to discharge home today.  Emotional Assessment Appearance:  Appears stated age Attitude/Demeanor/Rapport:    Affect (typically observed):  Appropriate, Stable, Calm,  Pleasant Orientation:  Oriented to Place, Oriented to Self, Oriented to  Time, Oriented to Situation Alcohol / Substance use:  Not Applicable Psych involvement (Current and /or in the community):  No (Comment)  Discharge Needs  Concerns to be addressed:  No discharge needs identified Readmission within the last 30 days:  No Current discharge risk:  None Barriers to Discharge:  No Barriers Identified   Ross Ludwig, LCSWA 09/30/2014, 11:49 AM

## 2014-09-30 NOTE — Clinical Social Work Note (Addendum)
Patient does not have any other social work needs, plan is to return back home with her husband, once she is medically ready and discharge orders have been received, CSW to sign off.  Jones Broom. Hardy, MSW, [place] 09/30/2014 11:53 AM

## 2014-09-30 NOTE — Progress Notes (Signed)
Patient ID: Shelly Green, female   DOB: Feb 17, 1960, 54 y.o.   MRN: [id number]   LOS: 2 days   Subjective: Sore but feeling a little better. Did not really ambulate yesterday.   Objective: Vital signs in last 24 hours: Temp:  [97.6 F (36.4 C)-99 F (37.2 C)] 97.6 F (36.4 C) (09/09 0743) Pulse Rate:  [58-77] 70 (09/09 0800) Resp:  [13-24] 14 (09/09 0800) BP: (99-138)/(59-102) 115/84 mmHg (09/09 0800) SpO2:  [96 %-99 %] 97 % (09/09 0800) Weight:  [52.9 kg (116 lb 10 oz)] 52.9 kg (116 lb 10 oz) (09/09 0805)    Laboratory  CBC  Recent Labs  09/29/14 0620 09/30/14 0233  WBC 8.3 9.2  HGB 10.6* 10.9*  HCT 31.9* 32.0*  PLT 156 156    Physical Exam General appearance: alert and no distress Resp: clear to auscultation bilaterally Cardio: regular rate and rhythm GI: Soft, +BS, mod TTP   Assessment/Plan: S/p colonoscopy 9/6 Iatrogenic spleen laceration with active extravasation/hemoperitoneum-s/p IR angio, no active bleeding. Pt remains stable. Ambulate q2h today in unit. ABL anemia- Stable FEN-SLIV, try tramadol for pain, not allergic, tylenol VTE prophylaxis-SCDs only Dispo- Recheck CBC this afternoon, will d/c home if stable    Lisette Abu, PA-C Pager: [phone] General Trauma PA Pager: [phone]  09/30/2014

## 2014-09-30 NOTE — Discharge Instructions (Signed)
No running, jumping, ball or contact sports, bikes, skateboards, motorcycles, etc for 6 weeks.

## 2014-10-08 ENCOUNTER — Other Ambulatory Visit: Payer: Self-pay | Admitting: Obstetrics

## 2014-11-15 ENCOUNTER — Ambulatory Visit (INDEPENDENT_AMBULATORY_CARE_PROVIDER_SITE_OTHER): Payer: Managed Care, Other (non HMO) | Admitting: Internal Medicine

## 2014-11-15 ENCOUNTER — Encounter: Payer: Self-pay | Admitting: Internal Medicine

## 2014-11-15 VITALS — BP 160/98 | HR 83 | Wt 123.0 lb

## 2014-11-15 DIAGNOSIS — F4010 Social phobia, unspecified: Secondary | ICD-10-CM | POA: Diagnosis not present

## 2014-11-15 DIAGNOSIS — K589 Irritable bowel syndrome without diarrhea: Secondary | ICD-10-CM | POA: Diagnosis not present

## 2014-11-15 MED ORDER — BUSPIRONE HCL 15 MG PO TABS: 15.0000 mg | ORAL_TABLET | Freq: Two times a day (BID) | ORAL | Status: DC

## 2014-11-15 MED ORDER — ELUXADOLINE 75 MG PO TABS
75.0000 mg | ORAL_TABLET | Freq: Two times a day (BID) | ORAL | Status: DC
Start: 2014-11-15 — End: 2015-08-23

## 2014-11-15 NOTE — Assessment & Plan Note (Signed)
Try Buspar - may help w/IBS-D

## 2014-11-15 NOTE — Progress Notes (Signed)
Subjective:  Patient ID: Shelly Green, female    DOB: [birth year]  Age: 54 y.o. MRN: [id number]  CC: No chief complaint on file.   HPI Shelly Green presents for IBS-d predominant (rare constipation). Pt is smoking 1.5 ppd  Outpatient Prescriptions Prior to Visit  Medication Sig Dispense Refill  . amphetamine-dextroamphetamine (ADDERALL) 20 MG tablet Take 20 mg by mouth 3 (three) times daily.  0  . butalbital-acetaminophen-caffeine (FIORICET, ESGIC) 50-325-40 MG per tablet TAKE 2 TABLETS BY MOUTH EVERY 6 HOURS AS NEEDED 40 tablet 2  . estrogens, conjugated, (PREMARIN) 1.25 MG tablet Take 1 tablet (1.25 mg total) by mouth daily. 30 tablet 11  . loratadine (CLARITIN) 10 MG tablet Take 10 mg by mouth daily.    . Probiotic Product (PROBIOTIC DAILY PO) Take 1 capsule by mouth daily.    Marland Kitchen CREON 36000 UNITS CPEP capsule Take 1-2 capsules by mouth 3 (three) times daily with meals.  11  . traMADol (ULTRAM) 50 MG tablet Take 1 tablet (50 mg total) by mouth every 6 (six) hours as needed for moderate pain or severe pain. (Patient not taking: Reported on 11/15/2014) 50 tablet 0   No facility-administered medications prior to visit.    ROS Review of Systems  Objective:  BP 160/98 mmHg  Pulse 83  Wt 123 lb (55.792 kg)  SpO2 95%  BP Readings from Last 3 Encounters:  11/15/14 160/98  09/30/14 141/85  08/01/14 136/90    Wt Readings from Last 3 Encounters:  11/15/14 123 lb (55.792 kg)  09/30/14 116 lb 10 oz (52.9 kg)  08/01/14 127 lb (57.607 kg)    Physical Exam  Lab Results  Component Value Date   WBC 8.4 09/30/2014   HGB 10.6* 09/30/2014   HCT 31.8* 09/30/2014   PLT 158 09/30/2014   GLUCOSE 130* 09/29/2014   CHOL 186 05/14/2013   TRIG 199.0* 05/14/2013   HDL 37.30* 05/14/2013   LDLCALC 109* 05/14/2013   ALT 10* 09/28/2014   AST 18 09/28/2014   NA 138 09/29/2014   K 3.6 09/29/2014   CL 107 09/29/2014   CREATININE 0.51 09/29/2014   BUN <5* 09/29/2014   CO2 26  09/29/2014   TSH 1.51 09/24/2013   INR 1.02 09/28/2014    Ct Abdomen Pelvis W Contrast  09/28/2014  CLINICAL DATA:  Left lower quadrant pain, colonoscopy 09/27/2014. EXAM: CT ABDOMEN AND PELVIS WITH CONTRAST TECHNIQUE: Multidetector CT imaging of the abdomen and pelvis was performed using the standard protocol following bolus administration of intravenous contrast. CONTRAST:  142mL OMNIPAQUE IOHEXOL 300 MG/ML  SOLN COMPARISON:  08/20/2005 FINDINGS: Lung bases shows mild dependent atelectasis. Sagittal images of the spine are unremarkable. There is moderate peripelvic and perihepatic ascites. Patient is status post cholecystectomy. Mild intrahepatic biliary ductal dilatation. Perihepatic fluid measures 30 Hounsfield units in attenuation. Perisplenic fluid measures 44.5 Hounsfield units in attenuation highly suspicious for hemorrhagic fluid. There is low-attenuation lesion posterior aspect of the spleen axial image 13 and 14 Measures 1.7 cm. Findings are highly suspicious for focal splenic laceration with active contrast extravasation/hemorrhage. There is some free fluid in right paracolic gutter. Moderate pelvic ascites with hemorrhagic fluid measures 31.5 Hounsfield units in attenuation. There is no evidence of colonic perforation. No free abdominal air. There is a small left infraumbilical ventral hernia containing fat some fluid axial image 37 without evidence of acute complication measures about 1.8 cm. No small bowel obstruction. Mild thickening of jejunal wall axial image 40 may represent enteritis or  minimal small bowel edema. There is no evidence of small bowel obstruction. Some colonic gas noted in cecum and sigmoid colon. The urinary bladder is unremarkable. The patient is status post hysterectomy. No pericecal inflammation.  Normal appendix. IMPRESSION: 1. There is perihepatic and perisplenic moderate ascites. Perisplenic hemoperitoneum. Low-density lesion within posterior aspect of the spleen  measures 1.7 cm highly suspicious for small splenic laceration with active contrast extravasation/hemorrhage. Perisplenic fluid measures 44.5 Hounsfield units in attenuation consistent with hemorrhagic products. Moderate pelvic ascites with hemorrhagic products. 2. There is no evidence of colonic perforation. Mild thickening of small bowel lobe may in jejunal region probable enteritis or mild small bowel wall edema. There is no evidence of small bowel obstruction. 3. Status post hysterectomy. Critical Value/emergent results were called by telephone at the time of interpretation on 09/28/2014 at 12:04 pm to Dr. Jola Schmidt , who verbally acknowledged these results. Normal appendix. No pericecal inflammation. Some residual colonic gas post colonoscopy yesterday. Electronically Signed   By: Lahoma Crocker M.D.   On: 09/28/2014 12:05   Ir Angiogram Visceral Selective  09/29/2014  CLINICAL DATA:  Iatrogenic splenic laceration after colonoscopy with hemo peritoneum. CT shows focal laceration along the medial aspect of the spleen superiorly. There is question of focal contrast extravasation by CT. EXAM: 1. ULTRASOUND GUIDANCE FOR VASCULAR ACCESS OF THE RIGHT COMMON FEMORAL ARTERY 2. SELECTIVE VISCERAL ARTERIOGRAPHY OF THE CELIAC AXIS 3. SELECTIVE ARTERIOGRAPHY OF THE SPLENIC ARTERY 4. SELECTIVE ARTERIOGRAPHY OF THIRD ORDER SPLENIC ARTERY BRANCH ANESTHESIA/SEDATION: 1.5 Mg IV Versed; 50 mcg IV Fentanyl. Total Moderate Sedation Time: 75  Minutes. CONTRAST:  65 mL Omnipaque 300 FLUOROSCOPY TIME:  19 minutes and 30 seconds PROCEDURE: The procedure, risks, benefits, and alternatives were explained to the patient. Questions regarding the procedure were encouraged and answered. The patient understands and consents to the procedure. A time-out was performed prior to the procedure. The right groin was prepped with Betadine in a sterile fashion, and a sterile drape was applied covering the operative field. A sterile gown and sterile  gloves were used for the procedure. Local anesthesia was provided with 1% Lidocaine. After small skin incision, a 21 gauge needle was advanced into the right common femoral artery under direct ultrasound guidance. Ultrasound image documentation was performed. After establishing guide wire access, a 5-French sheath was placed. A 5 Fr diagnostic catheter was advanced over a guidewire into the abdominal aorta. The catheter was then used to selectively catheterize the celiac axis. Selective arteriography was performed. A micro catheter was then advanced into the splenic artery and additional selective arteriography performed. The micro catheter was further advanced into a superior splenic artery branch and additional arteriography performed. Attempts were made to advance various guidewires through the micro catheter into an additional splenic artery branch. Right femoral arteriotomy hemostasis: Cordis Exoseal. COMPLICATIONS: None. FINDINGS: Celiac arteriography demonstrates a very tortuous splenic artery and otherwise normal patent celiac branches including a patent left gastric artery, common hepatic artery and gastroduodenal artery. The splenic artery bifurcates into a dominant upper to mid division branch and a smaller lower pole branch. After advancing a micro catheter into the superior branch, selective arteriography demonstrates intraparenchymal arterial irregularity involving a medial superior splenic artery branch. This is in the exact location of the focal splenic laceration by CT. There appeared to be some potential hemorrhage into the substance of the spleen but no overt contrast extravasation either in the spleen or outside of the confines of the splenic parenchyma. Attempt was made to selectively  catheterize the medial superior branch demonstrating irregularity. However, due to significant tortuosity at the origin of this branch, the vessel could not be selectively catheterized. Based on angiographic  findings and inability to catheterize the smaller irregular branch vessel, decision was made not to perform embolization today. Coil embolization would have compromised approximately 2/3 of the splenic parenchyma. IMPRESSION: Celiac and splenic arteriography demonstrates arterial irregularity involving a branch supplying the medial and superior spleen. This corresponds in location to the splenic laceration by CT. No contrast extravasation was identified by arteriography and attempts at catheterizing the smaller branch vessel were unsuccessful. Decision was made to not performed a non selective embolization at this time which would have compromised 2/3 of the splenic parenchyma. Findings were discussed with Dr. Hulen Skains immediately following the arteriographic procedure. Electronically Signed   By: Aletta Edouard M.D.   On: 09/29/2014 11:01   Ir Angiogram Selective Each Additional Vessel  09/29/2014  CLINICAL DATA:  Iatrogenic splenic laceration after colonoscopy with hemo peritoneum. CT shows focal laceration along the medial aspect of the spleen superiorly. There is question of focal contrast extravasation by CT. EXAM: 1. ULTRASOUND GUIDANCE FOR VASCULAR ACCESS OF THE RIGHT COMMON FEMORAL ARTERY 2. SELECTIVE VISCERAL ARTERIOGRAPHY OF THE CELIAC AXIS 3. SELECTIVE ARTERIOGRAPHY OF THE SPLENIC ARTERY 4. SELECTIVE ARTERIOGRAPHY OF THIRD ORDER SPLENIC ARTERY BRANCH ANESTHESIA/SEDATION: 1.5 Mg IV Versed; 50 mcg IV Fentanyl. Total Moderate Sedation Time: 75  Minutes. CONTRAST:  65 mL Omnipaque 300 FLUOROSCOPY TIME:  19 minutes and 30 seconds PROCEDURE: The procedure, risks, benefits, and alternatives were explained to the patient. Questions regarding the procedure were encouraged and answered. The patient understands and consents to the procedure. A time-out was performed prior to the procedure. The right groin was prepped with Betadine in a sterile fashion, and a sterile drape was applied covering the operative field. A  sterile gown and sterile gloves were used for the procedure. Local anesthesia was provided with 1% Lidocaine. After small skin incision, a 21 gauge needle was advanced into the right common femoral artery under direct ultrasound guidance. Ultrasound image documentation was performed. After establishing guide wire access, a 5-French sheath was placed. A 5 Fr diagnostic catheter was advanced over a guidewire into the abdominal aorta. The catheter was then used to selectively catheterize the celiac axis. Selective arteriography was performed. A micro catheter was then advanced into the splenic artery and additional selective arteriography performed. The micro catheter was further advanced into a superior splenic artery branch and additional arteriography performed. Attempts were made to advance various guidewires through the micro catheter into an additional splenic artery branch. Right femoral arteriotomy hemostasis: Cordis Exoseal. COMPLICATIONS: None. FINDINGS: Celiac arteriography demonstrates a very tortuous splenic artery and otherwise normal patent celiac branches including a patent left gastric artery, common hepatic artery and gastroduodenal artery. The splenic artery bifurcates into a dominant upper to mid division branch and a smaller lower pole branch. After advancing a micro catheter into the superior branch, selective arteriography demonstrates intraparenchymal arterial irregularity involving a medial superior splenic artery branch. This is in the exact location of the focal splenic laceration by CT. There appeared to be some potential hemorrhage into the substance of the spleen but no overt contrast extravasation either in the spleen or outside of the confines of the splenic parenchyma. Attempt was made to selectively catheterize the medial superior branch demonstrating irregularity. However, due to significant tortuosity at the origin of this branch, the vessel could not be selectively catheterized.  Based on angiographic  findings and inability to catheterize the smaller irregular branch vessel, decision was made not to perform embolization today. Coil embolization would have compromised approximately 2/3 of the splenic parenchyma. IMPRESSION: Celiac and splenic arteriography demonstrates arterial irregularity involving a branch supplying the medial and superior spleen. This corresponds in location to the splenic laceration by CT. No contrast extravasation was identified by arteriography and attempts at catheterizing the smaller branch vessel were unsuccessful. Decision was made to not performed a non selective embolization at this time which would have compromised 2/3 of the splenic parenchyma. Findings were discussed with Dr. Hulen Skains immediately following the arteriographic procedure. Electronically Signed   By: Aletta Edouard M.D.   On: 09/29/2014 11:01   Ir Angiogram Selective Each Additional Vessel  09/29/2014  CLINICAL DATA:  Iatrogenic splenic laceration after colonoscopy with hemo peritoneum. CT shows focal laceration along the medial aspect of the spleen superiorly. There is question of focal contrast extravasation by CT. EXAM: 1. ULTRASOUND GUIDANCE FOR VASCULAR ACCESS OF THE RIGHT COMMON FEMORAL ARTERY 2. SELECTIVE VISCERAL ARTERIOGRAPHY OF THE CELIAC AXIS 3. SELECTIVE ARTERIOGRAPHY OF THE SPLENIC ARTERY 4. SELECTIVE ARTERIOGRAPHY OF THIRD ORDER SPLENIC ARTERY BRANCH ANESTHESIA/SEDATION: 1.5 Mg IV Versed; 50 mcg IV Fentanyl. Total Moderate Sedation Time: 75  Minutes. CONTRAST:  65 mL Omnipaque 300 FLUOROSCOPY TIME:  19 minutes and 30 seconds PROCEDURE: The procedure, risks, benefits, and alternatives were explained to the patient. Questions regarding the procedure were encouraged and answered. The patient understands and consents to the procedure. A time-out was performed prior to the procedure. The right groin was prepped with Betadine in a sterile fashion, and a sterile drape was applied covering  the operative field. A sterile gown and sterile gloves were used for the procedure. Local anesthesia was provided with 1% Lidocaine. After small skin incision, a 21 gauge needle was advanced into the right common femoral artery under direct ultrasound guidance. Ultrasound image documentation was performed. After establishing guide wire access, a 5-French sheath was placed. A 5 Fr diagnostic catheter was advanced over a guidewire into the abdominal aorta. The catheter was then used to selectively catheterize the celiac axis. Selective arteriography was performed. A micro catheter was then advanced into the splenic artery and additional selective arteriography performed. The micro catheter was further advanced into a superior splenic artery branch and additional arteriography performed. Attempts were made to advance various guidewires through the micro catheter into an additional splenic artery branch. Right femoral arteriotomy hemostasis: Cordis Exoseal. COMPLICATIONS: None. FINDINGS: Celiac arteriography demonstrates a very tortuous splenic artery and otherwise normal patent celiac branches including a patent left gastric artery, common hepatic artery and gastroduodenal artery. The splenic artery bifurcates into a dominant upper to mid division branch and a smaller lower pole branch. After advancing a micro catheter into the superior branch, selective arteriography demonstrates intraparenchymal arterial irregularity involving a medial superior splenic artery branch. This is in the exact location of the focal splenic laceration by CT. There appeared to be some potential hemorrhage into the substance of the spleen but no overt contrast extravasation either in the spleen or outside of the confines of the splenic parenchyma. Attempt was made to selectively catheterize the medial superior branch demonstrating irregularity. However, due to significant tortuosity at the origin of this branch, the vessel could not be  selectively catheterized. Based on angiographic findings and inability to catheterize the smaller irregular branch vessel, decision was made not to perform embolization today. Coil embolization would have compromised approximately 2/3 of the splenic parenchyma.  IMPRESSION: Celiac and splenic arteriography demonstrates arterial irregularity involving a branch supplying the medial and superior spleen. This corresponds in location to the splenic laceration by CT. No contrast extravasation was identified by arteriography and attempts at catheterizing the smaller branch vessel were unsuccessful. Decision was made to not performed a non selective embolization at this time which would have compromised 2/3 of the splenic parenchyma. Findings were discussed with Dr. Hulen Skains immediately following the arteriographic procedure. Electronically Signed   By: Aletta Edouard M.D.   On: 09/29/2014 11:01   Ir US Guide Vasc Access Right  09/29/2014  CLINICAL DATA:  Iatrogenic splenic laceration after colonoscopy with hemo peritoneum. CT shows focal laceration along the medial aspect of the spleen superiorly. There is question of focal contrast extravasation by CT. EXAM: 1. ULTRASOUND GUIDANCE FOR VASCULAR ACCESS OF THE RIGHT COMMON FEMORAL ARTERY 2. SELECTIVE VISCERAL ARTERIOGRAPHY OF THE CELIAC AXIS 3. SELECTIVE ARTERIOGRAPHY OF THE SPLENIC ARTERY 4. SELECTIVE ARTERIOGRAPHY OF THIRD ORDER SPLENIC ARTERY BRANCH ANESTHESIA/SEDATION: 1.5 Mg IV Versed; 50 mcg IV Fentanyl. Total Moderate Sedation Time: 75  Minutes. CONTRAST:  65 mL Omnipaque 300 FLUOROSCOPY TIME:  19 minutes and 30 seconds PROCEDURE: The procedure, risks, benefits, and alternatives were explained to the patient. Questions regarding the procedure were encouraged and answered. The patient understands and consents to the procedure. A time-out was performed prior to the procedure. The right groin was prepped with Betadine in a sterile fashion, and a sterile drape was applied  covering the operative field. A sterile gown and sterile gloves were used for the procedure. Local anesthesia was provided with 1% Lidocaine. After small skin incision, a 21 gauge needle was advanced into the right common femoral artery under direct ultrasound guidance. Ultrasound image documentation was performed. After establishing guide wire access, a 5-French sheath was placed. A 5 Fr diagnostic catheter was advanced over a guidewire into the abdominal aorta. The catheter was then used to selectively catheterize the celiac axis. Selective arteriography was performed. A micro catheter was then advanced into the splenic artery and additional selective arteriography performed. The micro catheter was further advanced into a superior splenic artery branch and additional arteriography performed. Attempts were made to advance various guidewires through the micro catheter into an additional splenic artery branch. Right femoral arteriotomy hemostasis: Cordis Exoseal. COMPLICATIONS: None. FINDINGS: Celiac arteriography demonstrates a very tortuous splenic artery and otherwise normal patent celiac branches including a patent left gastric artery, common hepatic artery and gastroduodenal artery. The splenic artery bifurcates into a dominant upper to mid division branch and a smaller lower pole branch. After advancing a micro catheter into the superior branch, selective arteriography demonstrates intraparenchymal arterial irregularity involving a medial superior splenic artery branch. This is in the exact location of the focal splenic laceration by CT. There appeared to be some potential hemorrhage into the substance of the spleen but no overt contrast extravasation either in the spleen or outside of the confines of the splenic parenchyma. Attempt was made to selectively catheterize the medial superior branch demonstrating irregularity. However, due to significant tortuosity at the origin of this branch, the vessel could not  be selectively catheterized. Based on angiographic findings and inability to catheterize the smaller irregular branch vessel, decision was made not to perform embolization today. Coil embolization would have compromised approximately 2/3 of the splenic parenchyma. IMPRESSION: Celiac and splenic arteriography demonstrates arterial irregularity involving a branch supplying the medial and superior spleen. This corresponds in location to the splenic laceration by CT. No contrast  extravasation was identified by arteriography and attempts at catheterizing the smaller branch vessel were unsuccessful. Decision was made to not performed a non selective embolization at this time which would have compromised 2/3 of the splenic parenchyma. Findings were discussed with Dr. Hulen Skains immediately following the arteriographic procedure. Electronically Signed   By: Aletta Edouard M.D.   On: 09/29/2014 11:01    Assessment & Plan:   Diagnoses and all orders for this visit:  Social anxiety disorder  IBS (irritable bowel syndrome)  Other orders -     busPIRone (BUSPAR) 15 MG tablet; Take 1 tablet (15 mg total) by mouth 2 (two) times daily. -     Eluxadoline (VIBERZI) 75 MG TABS; Take 75 mg by mouth 2 (two) times daily.  I have discontinued Ms. Blaszczyk's CREON and traMADol. I am also having her start on busPIRone and Eluxadoline. Additionally, I am having her maintain her estrogens (conjugated), amphetamine-dextroamphetamine, Probiotic Product (PROBIOTIC DAILY PO), loratadine, and butalbital-acetaminophen-caffeine.  Meds ordered this encounter  Medications  . busPIRone (BUSPAR) 15 MG tablet    Sig: Take 1 tablet (15 mg total) by mouth 2 (two) times daily.    Dispense:  60 tablet    Refill:  5  . Eluxadoline (VIBERZI) 75 MG TABS    Sig: Take 75 mg by mouth 2 (two) times daily.    Dispense:  60 tablet    Refill:  5     Follow-up: Return in about 2 months (around 01/15/2015) for a follow-up visit.  Walker Kehr, MD

## 2014-11-15 NOTE — Assessment & Plan Note (Signed)
Try Buspar - may help w/IBS-D  Try Vibersi lower dose - stop if constipated

## 2014-11-15 NOTE — Progress Notes (Signed)
Pre visit review using our clinic review tool, if applicable. No additional management support is needed unless otherwise documented below in the visit note. 

## 2014-11-21 ENCOUNTER — Ambulatory Visit: Payer: Managed Care, Other (non HMO) | Admitting: Obstetrics & Gynecology

## 2015-01-18 ENCOUNTER — Ambulatory Visit: Payer: Managed Care, Other (non HMO) | Admitting: Internal Medicine

## 2015-01-27 ENCOUNTER — Encounter: Payer: Self-pay | Admitting: Internal Medicine

## 2015-01-27 ENCOUNTER — Ambulatory Visit (INDEPENDENT_AMBULATORY_CARE_PROVIDER_SITE_OTHER): Payer: Managed Care, Other (non HMO) | Admitting: Internal Medicine

## 2015-01-27 VITALS — BP 128/88 | HR 71 | Temp 98.0°F | Resp 20 | Ht 63.0 in | Wt 125.0 lb

## 2015-01-27 DIAGNOSIS — F17200 Nicotine dependence, unspecified, uncomplicated: Secondary | ICD-10-CM

## 2015-01-27 DIAGNOSIS — F4010 Social phobia, unspecified: Secondary | ICD-10-CM

## 2015-01-27 DIAGNOSIS — K589 Irritable bowel syndrome without diarrhea: Secondary | ICD-10-CM | POA: Diagnosis not present

## 2015-01-27 NOTE — Progress Notes (Signed)
Pre visit review using our clinic review tool, if applicable. No additional management support is needed unless otherwise documented below in the visit note. 

## 2015-01-27 NOTE — Progress Notes (Signed)
Subjective:  Patient ID: Shelly Green, female    DOB: Aug 13, 1960  Age: 55 y.o. MRN: [id number]  CC: No chief complaint on file.   HPI AALIAH HICKOX presents for anxiety, IBS-D. All better on Buspar. Not taking Viberzi  Outpatient Prescriptions Prior to Visit  Medication Sig Dispense Refill  . amphetamine-dextroamphetamine (ADDERALL) 20 MG tablet Take 20 mg by mouth 3 (three) times daily.  0  . busPIRone (BUSPAR) 15 MG tablet Take 1 tablet (15 mg total) by mouth 2 (two) times daily. 60 tablet 5  . butalbital-acetaminophen-caffeine (FIORICET, ESGIC) 50-325-40 MG per tablet TAKE 2 TABLETS BY MOUTH EVERY 6 HOURS AS NEEDED 40 tablet 2  . Eluxadoline (VIBERZI) 75 MG TABS Take 75 mg by mouth 2 (two) times daily. 60 tablet 5  . estrogens, conjugated, (PREMARIN) 1.25 MG tablet Take 1 tablet (1.25 mg total) by mouth daily. 30 tablet 11  . loratadine (CLARITIN) 10 MG tablet Take 10 mg by mouth daily.    . Probiotic Product (PROBIOTIC DAILY PO) Take 1 capsule by mouth daily.     No facility-administered medications prior to visit.    ROS Review of Systems  Constitutional: Negative for chills, activity change, appetite change, fatigue and unexpected weight change.  HENT: Negative for congestion, mouth sores and sinus pressure.   Eyes: Negative for visual disturbance.  Respiratory: Negative for cough and chest tightness.   Gastrointestinal: Negative for nausea and abdominal pain.  Genitourinary: Negative for frequency, difficulty urinating and vaginal pain.  Musculoskeletal: Negative for back pain and gait problem.  Skin: Negative for pallor and rash.  Neurological: Negative for dizziness, tremors, weakness, numbness and headaches.  Psychiatric/Behavioral: Negative for suicidal ideas, confusion and sleep disturbance. The patient is nervous/anxious.     Objective:  BP 128/88 mmHg  Pulse 71  Temp(Src) 98 F (36.7 C) (Oral)  Resp 20  Ht 5\' 3"  (1.6 m)  Wt 125 lb (56.7 kg)  BMI  22.15 kg/m2  SpO2 98%  BP Readings from Last 3 Encounters:  01/27/15 128/88  11/15/14 160/98  09/30/14 141/85    Wt Readings from Last 3 Encounters:  01/27/15 125 lb (56.7 kg)  11/15/14 123 lb (55.792 kg)  09/30/14 116 lb 10 oz (52.9 kg)    Physical Exam  Constitutional: She appears well-developed. No distress.  HENT:  Head: Normocephalic.  Right Ear: External ear normal.  Left Ear: External ear normal.  Nose: Nose normal.  Mouth/Throat: Oropharynx is clear and moist.  Eyes: Conjunctivae are normal. Pupils are equal, round, and reactive to light. Right eye exhibits no discharge. Left eye exhibits no discharge.  Neck: Normal range of motion. Neck supple. No JVD present. No tracheal deviation present. No thyromegaly present.  Cardiovascular: Normal rate, regular rhythm and normal heart sounds.   Pulmonary/Chest: No stridor. No respiratory distress. She has no wheezes.  Abdominal: Soft. Bowel sounds are normal. She exhibits no distension and no mass. There is no tenderness. There is no rebound and no guarding.  Musculoskeletal: She exhibits no edema or tenderness.  Lymphadenopathy:    She has no cervical adenopathy.  Neurological: She displays normal reflexes. No cranial nerve deficit. She exhibits normal muscle tone. Coordination normal.  Skin: No rash noted. No erythema.  Psychiatric: Her behavior is normal. Judgment and thought content normal.    Lab Results  Component Value Date   WBC 8.4 09/30/2014   HGB 10.6* 09/30/2014   HCT 31.8* 09/30/2014   PLT 158 09/30/2014   GLUCOSE 130*  09/29/2014   CHOL 186 05/14/2013   TRIG 199.0* 05/14/2013   HDL 37.30* 05/14/2013   LDLCALC 109* 05/14/2013   ALT 10* 09/28/2014   AST 18 09/28/2014   NA 138 09/29/2014   K 3.6 09/29/2014   CL 107 09/29/2014   CREATININE 0.51 09/29/2014   BUN <5* 09/29/2014   CO2 26 09/29/2014   TSH 1.51 09/24/2013   INR 1.02 09/28/2014    Ct Abdomen Pelvis W Contrast  09/28/2014  CLINICAL DATA:   Left lower quadrant pain, colonoscopy 09/27/2014. EXAM: CT ABDOMEN AND PELVIS WITH CONTRAST TECHNIQUE: Multidetector CT imaging of the abdomen and pelvis was performed using the standard protocol following bolus administration of intravenous contrast. CONTRAST:  162mL OMNIPAQUE IOHEXOL 300 MG/ML  SOLN COMPARISON:  08/20/2005 FINDINGS: Lung bases shows mild dependent atelectasis. Sagittal images of the spine are unremarkable. There is moderate peripelvic and perihepatic ascites. Patient is status post cholecystectomy. Mild intrahepatic biliary ductal dilatation. Perihepatic fluid measures 30 Hounsfield units in attenuation. Perisplenic fluid measures 44.5 Hounsfield units in attenuation highly suspicious for hemorrhagic fluid. There is low-attenuation lesion posterior aspect of the spleen axial image 13 and 14 Measures 1.7 cm. Findings are highly suspicious for focal splenic laceration with active contrast extravasation/hemorrhage. There is some free fluid in right paracolic gutter. Moderate pelvic ascites with hemorrhagic fluid measures 31.5 Hounsfield units in attenuation. There is no evidence of colonic perforation. No free abdominal air. There is a small left infraumbilical ventral hernia containing fat some fluid axial image 37 without evidence of acute complication measures about 1.8 cm. No small bowel obstruction. Mild thickening of jejunal wall axial image 40 may represent enteritis or minimal small bowel edema. There is no evidence of small bowel obstruction. Some colonic gas noted in cecum and sigmoid colon. The urinary bladder is unremarkable. The patient is status post hysterectomy. No pericecal inflammation.  Normal appendix. IMPRESSION: 1. There is perihepatic and perisplenic moderate ascites. Perisplenic hemoperitoneum. Low-density lesion within posterior aspect of the spleen measures 1.7 cm highly suspicious for small splenic laceration with active contrast extravasation/hemorrhage. Perisplenic fluid  measures 44.5 Hounsfield units in attenuation consistent with hemorrhagic products. Moderate pelvic ascites with hemorrhagic products. 2. There is no evidence of colonic perforation. Mild thickening of small bowel lobe may in jejunal region probable enteritis or mild small bowel wall edema. There is no evidence of small bowel obstruction. 3. Status post hysterectomy. Critical Value/emergent results were called by telephone at the time of interpretation on 09/28/2014 at 12:04 pm to Dr. Jola Schmidt , who verbally acknowledged these results. Normal appendix. No pericecal inflammation. Some residual colonic gas post colonoscopy yesterday. Electronically Signed   By: Lahoma Crocker M.D.   On: 09/28/2014 12:05   Ir Angiogram Visceral Selective  09/29/2014  CLINICAL DATA:  Iatrogenic splenic laceration after colonoscopy with hemo peritoneum. CT shows focal laceration along the medial aspect of the spleen superiorly. There is question of focal contrast extravasation by CT. EXAM: 1. ULTRASOUND GUIDANCE FOR VASCULAR ACCESS OF THE RIGHT COMMON FEMORAL ARTERY 2. SELECTIVE VISCERAL ARTERIOGRAPHY OF THE CELIAC AXIS 3. SELECTIVE ARTERIOGRAPHY OF THE SPLENIC ARTERY 4. SELECTIVE ARTERIOGRAPHY OF THIRD ORDER SPLENIC ARTERY BRANCH ANESTHESIA/SEDATION: 1.5 Mg IV Versed; 50 mcg IV Fentanyl. Total Moderate Sedation Time: 75  Minutes. CONTRAST:  65 mL Omnipaque 300 FLUOROSCOPY TIME:  19 minutes and 30 seconds PROCEDURE: The procedure, risks, benefits, and alternatives were explained to the patient. Questions regarding the procedure were encouraged and answered. The patient understands and consents to the  procedure. A time-out was performed prior to the procedure. The right groin was prepped with Betadine in a sterile fashion, and a sterile drape was applied covering the operative field. A sterile gown and sterile gloves were used for the procedure. Local anesthesia was provided with 1% Lidocaine. After small skin incision, a 21 gauge needle  was advanced into the right common femoral artery under direct ultrasound guidance. Ultrasound image documentation was performed. After establishing guide wire access, a 5-French sheath was placed. A 5 Fr diagnostic catheter was advanced over a guidewire into the abdominal aorta. The catheter was then used to selectively catheterize the celiac axis. Selective arteriography was performed. A micro catheter was then advanced into the splenic artery and additional selective arteriography performed. The micro catheter was further advanced into a superior splenic artery branch and additional arteriography performed. Attempts were made to advance various guidewires through the micro catheter into an additional splenic artery branch. Right femoral arteriotomy hemostasis: Cordis Exoseal. COMPLICATIONS: None. FINDINGS: Celiac arteriography demonstrates a very tortuous splenic artery and otherwise normal patent celiac branches including a patent left gastric artery, common hepatic artery and gastroduodenal artery. The splenic artery bifurcates into a dominant upper to mid division branch and a smaller lower pole branch. After advancing a micro catheter into the superior branch, selective arteriography demonstrates intraparenchymal arterial irregularity involving a medial superior splenic artery branch. This is in the exact location of the focal splenic laceration by CT. There appeared to be some potential hemorrhage into the substance of the spleen but no overt contrast extravasation either in the spleen or outside of the confines of the splenic parenchyma. Attempt was made to selectively catheterize the medial superior branch demonstrating irregularity. However, due to significant tortuosity at the origin of this branch, the vessel could not be selectively catheterized. Based on angiographic findings and inability to catheterize the smaller irregular branch vessel, decision was made not to perform embolization today. Coil  embolization would have compromised approximately 2/3 of the splenic parenchyma. IMPRESSION: Celiac and splenic arteriography demonstrates arterial irregularity involving a branch supplying the medial and superior spleen. This corresponds in location to the splenic laceration by CT. No contrast extravasation was identified by arteriography and attempts at catheterizing the smaller branch vessel were unsuccessful. Decision was made to not performed a non selective embolization at this time which would have compromised 2/3 of the splenic parenchyma. Findings were discussed with Dr. Hulen Skains immediately following the arteriographic procedure. Electronically Signed   By: Aletta Edouard M.D.   On: 09/29/2014 11:01   Ir Angiogram Selective Each Additional Vessel  09/29/2014  CLINICAL DATA:  Iatrogenic splenic laceration after colonoscopy with hemo peritoneum. CT shows focal laceration along the medial aspect of the spleen superiorly. There is question of focal contrast extravasation by CT. EXAM: 1. ULTRASOUND GUIDANCE FOR VASCULAR ACCESS OF THE RIGHT COMMON FEMORAL ARTERY 2. SELECTIVE VISCERAL ARTERIOGRAPHY OF THE CELIAC AXIS 3. SELECTIVE ARTERIOGRAPHY OF THE SPLENIC ARTERY 4. SELECTIVE ARTERIOGRAPHY OF THIRD ORDER SPLENIC ARTERY BRANCH ANESTHESIA/SEDATION: 1.5 Mg IV Versed; 50 mcg IV Fentanyl. Total Moderate Sedation Time: 75  Minutes. CONTRAST:  65 mL Omnipaque 300 FLUOROSCOPY TIME:  19 minutes and 30 seconds PROCEDURE: The procedure, risks, benefits, and alternatives were explained to the patient. Questions regarding the procedure were encouraged and answered. The patient understands and consents to the procedure. A time-out was performed prior to the procedure. The right groin was prepped with Betadine in a sterile fashion, and a sterile drape was applied covering the operative  field. A sterile gown and sterile gloves were used for the procedure. Local anesthesia was provided with 1% Lidocaine. After small skin  incision, a 21 gauge needle was advanced into the right common femoral artery under direct ultrasound guidance. Ultrasound image documentation was performed. After establishing guide wire access, a 5-French sheath was placed. A 5 Fr diagnostic catheter was advanced over a guidewire into the abdominal aorta. The catheter was then used to selectively catheterize the celiac axis. Selective arteriography was performed. A micro catheter was then advanced into the splenic artery and additional selective arteriography performed. The micro catheter was further advanced into a superior splenic artery branch and additional arteriography performed. Attempts were made to advance various guidewires through the micro catheter into an additional splenic artery branch. Right femoral arteriotomy hemostasis: Cordis Exoseal. COMPLICATIONS: None. FINDINGS: Celiac arteriography demonstrates a very tortuous splenic artery and otherwise normal patent celiac branches including a patent left gastric artery, common hepatic artery and gastroduodenal artery. The splenic artery bifurcates into a dominant upper to mid division branch and a smaller lower pole branch. After advancing a micro catheter into the superior branch, selective arteriography demonstrates intraparenchymal arterial irregularity involving a medial superior splenic artery branch. This is in the exact location of the focal splenic laceration by CT. There appeared to be some potential hemorrhage into the substance of the spleen but no overt contrast extravasation either in the spleen or outside of the confines of the splenic parenchyma. Attempt was made to selectively catheterize the medial superior branch demonstrating irregularity. However, due to significant tortuosity at the origin of this branch, the vessel could not be selectively catheterized. Based on angiographic findings and inability to catheterize the smaller irregular branch vessel, decision was made not to perform  embolization today. Coil embolization would have compromised approximately 2/3 of the splenic parenchyma. IMPRESSION: Celiac and splenic arteriography demonstrates arterial irregularity involving a branch supplying the medial and superior spleen. This corresponds in location to the splenic laceration by CT. No contrast extravasation was identified by arteriography and attempts at catheterizing the smaller branch vessel were unsuccessful. Decision was made to not performed a non selective embolization at this time which would have compromised 2/3 of the splenic parenchyma. Findings were discussed with Dr. Hulen Skains immediately following the arteriographic procedure. Electronically Signed   By: Aletta Edouard M.D.   On: 09/29/2014 11:01   Ir Angiogram Selective Each Additional Vessel  09/29/2014  CLINICAL DATA:  Iatrogenic splenic laceration after colonoscopy with hemo peritoneum. CT shows focal laceration along the medial aspect of the spleen superiorly. There is question of focal contrast extravasation by CT. EXAM: 1. ULTRASOUND GUIDANCE FOR VASCULAR ACCESS OF THE RIGHT COMMON FEMORAL ARTERY 2. SELECTIVE VISCERAL ARTERIOGRAPHY OF THE CELIAC AXIS 3. SELECTIVE ARTERIOGRAPHY OF THE SPLENIC ARTERY 4. SELECTIVE ARTERIOGRAPHY OF THIRD ORDER SPLENIC ARTERY BRANCH ANESTHESIA/SEDATION: 1.5 Mg IV Versed; 50 mcg IV Fentanyl. Total Moderate Sedation Time: 75  Minutes. CONTRAST:  65 mL Omnipaque 300 FLUOROSCOPY TIME:  19 minutes and 30 seconds PROCEDURE: The procedure, risks, benefits, and alternatives were explained to the patient. Questions regarding the procedure were encouraged and answered. The patient understands and consents to the procedure. A time-out was performed prior to the procedure. The right groin was prepped with Betadine in a sterile fashion, and a sterile drape was applied covering the operative field. A sterile gown and sterile gloves were used for the procedure. Local anesthesia was provided with 1%  Lidocaine. After small skin incision, a 21 gauge needle was  advanced into the right common femoral artery under direct ultrasound guidance. Ultrasound image documentation was performed. After establishing guide wire access, a 5-French sheath was placed. A 5 Fr diagnostic catheter was advanced over a guidewire into the abdominal aorta. The catheter was then used to selectively catheterize the celiac axis. Selective arteriography was performed. A micro catheter was then advanced into the splenic artery and additional selective arteriography performed. The micro catheter was further advanced into a superior splenic artery branch and additional arteriography performed. Attempts were made to advance various guidewires through the micro catheter into an additional splenic artery branch. Right femoral arteriotomy hemostasis: Cordis Exoseal. COMPLICATIONS: None. FINDINGS: Celiac arteriography demonstrates a very tortuous splenic artery and otherwise normal patent celiac branches including a patent left gastric artery, common hepatic artery and gastroduodenal artery. The splenic artery bifurcates into a dominant upper to mid division branch and a smaller lower pole branch. After advancing a micro catheter into the superior branch, selective arteriography demonstrates intraparenchymal arterial irregularity involving a medial superior splenic artery branch. This is in the exact location of the focal splenic laceration by CT. There appeared to be some potential hemorrhage into the substance of the spleen but no overt contrast extravasation either in the spleen or outside of the confines of the splenic parenchyma. Attempt was made to selectively catheterize the medial superior branch demonstrating irregularity. However, due to significant tortuosity at the origin of this branch, the vessel could not be selectively catheterized. Based on angiographic findings and inability to catheterize the smaller irregular branch vessel,  decision was made not to perform embolization today. Coil embolization would have compromised approximately 2/3 of the splenic parenchyma. IMPRESSION: Celiac and splenic arteriography demonstrates arterial irregularity involving a branch supplying the medial and superior spleen. This corresponds in location to the splenic laceration by CT. No contrast extravasation was identified by arteriography and attempts at catheterizing the smaller branch vessel were unsuccessful. Decision was made to not performed a non selective embolization at this time which would have compromised 2/3 of the splenic parenchyma. Findings were discussed with Dr. Hulen Skains immediately following the arteriographic procedure. Electronically Signed   By: Aletta Edouard M.D.   On: 09/29/2014 11:01   Ir US Guide Vasc Access Right  09/29/2014  CLINICAL DATA:  Iatrogenic splenic laceration after colonoscopy with hemo peritoneum. CT shows focal laceration along the medial aspect of the spleen superiorly. There is question of focal contrast extravasation by CT. EXAM: 1. ULTRASOUND GUIDANCE FOR VASCULAR ACCESS OF THE RIGHT COMMON FEMORAL ARTERY 2. SELECTIVE VISCERAL ARTERIOGRAPHY OF THE CELIAC AXIS 3. SELECTIVE ARTERIOGRAPHY OF THE SPLENIC ARTERY 4. SELECTIVE ARTERIOGRAPHY OF THIRD ORDER SPLENIC ARTERY BRANCH ANESTHESIA/SEDATION: 1.5 Mg IV Versed; 50 mcg IV Fentanyl. Total Moderate Sedation Time: 75  Minutes. CONTRAST:  65 mL Omnipaque 300 FLUOROSCOPY TIME:  19 minutes and 30 seconds PROCEDURE: The procedure, risks, benefits, and alternatives were explained to the patient. Questions regarding the procedure were encouraged and answered. The patient understands and consents to the procedure. A time-out was performed prior to the procedure. The right groin was prepped with Betadine in a sterile fashion, and a sterile drape was applied covering the operative field. A sterile gown and sterile gloves were used for the procedure. Local anesthesia was provided  with 1% Lidocaine. After small skin incision, a 21 gauge needle was advanced into the right common femoral artery under direct ultrasound guidance. Ultrasound image documentation was performed. After establishing guide wire access, a 5-French sheath was placed. A 5 Fr  diagnostic catheter was advanced over a guidewire into the abdominal aorta. The catheter was then used to selectively catheterize the celiac axis. Selective arteriography was performed. A micro catheter was then advanced into the splenic artery and additional selective arteriography performed. The micro catheter was further advanced into a superior splenic artery branch and additional arteriography performed. Attempts were made to advance various guidewires through the micro catheter into an additional splenic artery branch. Right femoral arteriotomy hemostasis: Cordis Exoseal. COMPLICATIONS: None. FINDINGS: Celiac arteriography demonstrates a very tortuous splenic artery and otherwise normal patent celiac branches including a patent left gastric artery, common hepatic artery and gastroduodenal artery. The splenic artery bifurcates into a dominant upper to mid division branch and a smaller lower pole branch. After advancing a micro catheter into the superior branch, selective arteriography demonstrates intraparenchymal arterial irregularity involving a medial superior splenic artery branch. This is in the exact location of the focal splenic laceration by CT. There appeared to be some potential hemorrhage into the substance of the spleen but no overt contrast extravasation either in the spleen or outside of the confines of the splenic parenchyma. Attempt was made to selectively catheterize the medial superior branch demonstrating irregularity. However, due to significant tortuosity at the origin of this branch, the vessel could not be selectively catheterized. Based on angiographic findings and inability to catheterize the smaller irregular branch  vessel, decision was made not to perform embolization today. Coil embolization would have compromised approximately 2/3 of the splenic parenchyma. IMPRESSION: Celiac and splenic arteriography demonstrates arterial irregularity involving a branch supplying the medial and superior spleen. This corresponds in location to the splenic laceration by CT. No contrast extravasation was identified by arteriography and attempts at catheterizing the smaller branch vessel were unsuccessful. Decision was made to not performed a non selective embolization at this time which would have compromised 2/3 of the splenic parenchyma. Findings were discussed with Dr. Hulen Skains immediately following the arteriographic procedure. Electronically Signed   By: Aletta Edouard M.D.   On: 09/29/2014 11:01    Assessment & Plan:   There are no diagnoses linked to this encounter. I am having Ms. Allegra maintain her estrogens (conjugated), amphetamine-dextroamphetamine, Probiotic Product (PROBIOTIC DAILY PO), loratadine, butalbital-acetaminophen-caffeine, busPIRone, and Eluxadoline.  No orders of the defined types were placed in this encounter.     Follow-up: No Follow-up on file.  Walker Kehr, MD

## 2015-01-27 NOTE — Assessment & Plan Note (Signed)
1/17 better on Buspar. Not taking Viberzi

## 2015-01-27 NOTE — Assessment & Plan Note (Signed)
Discussed.

## 2015-02-01 ENCOUNTER — Ambulatory Visit: Payer: Managed Care, Other (non HMO) | Admitting: Obstetrics

## 2015-02-01 ENCOUNTER — Telehealth: Payer: Self-pay | Admitting: Obstetrics

## 2015-02-14 NOTE — Telephone Encounter (Signed)
No return calls from patient.  

## 2015-04-05 ENCOUNTER — Ambulatory Visit (INDEPENDENT_AMBULATORY_CARE_PROVIDER_SITE_OTHER): Payer: Managed Care, Other (non HMO) | Admitting: Internal Medicine

## 2015-04-05 ENCOUNTER — Encounter: Payer: Self-pay | Admitting: Internal Medicine

## 2015-04-05 VITALS — BP 154/96 | HR 77 | Wt 125.0 lb

## 2015-04-05 DIAGNOSIS — F4010 Social phobia, unspecified: Secondary | ICD-10-CM

## 2015-04-05 DIAGNOSIS — M545 Low back pain, unspecified: Secondary | ICD-10-CM

## 2015-04-05 DIAGNOSIS — G43109 Migraine with aura, not intractable, without status migrainosus: Secondary | ICD-10-CM

## 2015-04-05 DIAGNOSIS — K589 Irritable bowel syndrome without diarrhea: Secondary | ICD-10-CM

## 2015-04-05 DIAGNOSIS — G8929 Other chronic pain: Secondary | ICD-10-CM

## 2015-04-05 MED ORDER — DICLOFENAC SODIUM 75 MG PO TBEC: 75.0000 mg | DELAYED_RELEASE_TABLET | Freq: Two times a day (BID) | ORAL | Status: DC | PRN

## 2015-04-05 MED ORDER — SUMATRIPTAN SUCCINATE 100 MG PO TABS: 100.0000 mg | ORAL_TABLET | Freq: Every day | ORAL | Status: DC | PRN

## 2015-04-05 MED ORDER — ONDANSETRON HCL 4 MG PO TABS: 4.0000 mg | ORAL_TABLET | Freq: Three times a day (TID) | ORAL | Status: DC | PRN

## 2015-04-05 NOTE — Assessment & Plan Note (Signed)
Doing fair 

## 2015-04-05 NOTE — Progress Notes (Signed)
Subjective:  Patient ID: Shelly Green, female    DOB: 06/27/1960  Age: 55 y.o. MRN: [id number]  CC: No chief complaint on file.   HPI LAVINE ASANTE presents for IBS-D, HAs. C/o cluster HAs/migraines - worse last week; L>>R - was down x 2 days this weekend. C/o nausea. Meds did not help  Outpatient Prescriptions Prior to Visit  Medication Sig Dispense Refill  . amphetamine-dextroamphetamine (ADDERALL) 20 MG tablet Take 20 mg by mouth 3 (three) times daily.  0  . busPIRone (BUSPAR) 15 MG tablet Take 1 tablet (15 mg total) by mouth 2 (two) times daily. 60 tablet 5  . butalbital-acetaminophen-caffeine (FIORICET, ESGIC) 50-325-40 MG per tablet TAKE 2 TABLETS BY MOUTH EVERY 6 HOURS AS NEEDED 40 tablet 2  . Eluxadoline (VIBERZI) 75 MG TABS Take 75 mg by mouth 2 (two) times daily. 60 tablet 5  . estrogens, conjugated, (PREMARIN) 1.25 MG tablet Take 1 tablet (1.25 mg total) by mouth daily. 30 tablet 11  . loratadine (CLARITIN) 10 MG tablet Take 10 mg by mouth daily.    . Probiotic Product (PROBIOTIC DAILY PO) Take 1 capsule by mouth daily.     No facility-administered medications prior to visit.    ROS Review of Systems  Constitutional: Negative for chills, activity change, appetite change, fatigue and unexpected weight change.  HENT: Positive for congestion and rhinorrhea. Negative for mouth sores and sinus pressure.   Eyes: Negative for visual disturbance.  Respiratory: Negative for cough and chest tightness.   Gastrointestinal: Negative for nausea, vomiting and abdominal pain.  Genitourinary: Negative for frequency, difficulty urinating and vaginal pain.  Musculoskeletal: Negative for back pain and gait problem.  Skin: Negative for pallor and rash.  Neurological: Positive for headaches. Negative for dizziness, tremors, weakness and numbness.  Psychiatric/Behavioral: Negative for suicidal ideas, confusion and sleep disturbance. The patient is not nervous/anxious.     Objective:    BP 154/96 mmHg  Pulse 77  Wt 125 lb (56.7 kg)  SpO2 98%  BP Readings from Last 3 Encounters:  04/05/15 154/96  01/27/15 128/88  11/15/14 160/98    Wt Readings from Last 3 Encounters:  04/05/15 125 lb (56.7 kg)  01/27/15 125 lb (56.7 kg)  11/15/14 123 lb (55.792 kg)    Physical Exam  Constitutional: She appears well-developed. No distress.  HENT:  Head: Normocephalic.  Right Ear: External ear normal.  Left Ear: External ear normal.  Nose: Nose normal.  Mouth/Throat: Oropharynx is clear and moist.  Eyes: Conjunctivae are normal. Pupils are equal, round, and reactive to light. Right eye exhibits no discharge. Left eye exhibits no discharge.  Neck: Normal range of motion. Neck supple. No JVD present. No tracheal deviation present. No thyromegaly present.  Cardiovascular: Normal rate, regular rhythm and normal heart sounds.   Pulmonary/Chest: No stridor. No respiratory distress. She has no wheezes.  Abdominal: Soft. Bowel sounds are normal. She exhibits no distension and no mass. There is no tenderness. There is no rebound and no guarding.  Musculoskeletal: She exhibits no edema or tenderness.  Lymphadenopathy:    She has no cervical adenopathy.  Neurological: She displays normal reflexes. No cranial nerve deficit. She exhibits normal muscle tone. Coordination normal.  Skin: No rash noted. No erythema.  Psychiatric: She has a normal mood and affect. Her behavior is normal. Judgment and thought content normal.    Lab Results  Component Value Date   WBC 8.4 09/30/2014   HGB 10.6* 09/30/2014   HCT 31.8* 09/30/2014  PLT 158 09/30/2014   GLUCOSE 130* 09/29/2014   CHOL 186 05/14/2013   TRIG 199.0* 05/14/2013   HDL 37.30* 05/14/2013   LDLCALC 109* 05/14/2013   ALT 10* 09/28/2014   AST 18 09/28/2014   NA 138 09/29/2014   K 3.6 09/29/2014   CL 107 09/29/2014   CREATININE 0.51 09/29/2014   BUN <5* 09/29/2014   CO2 26 09/29/2014   TSH 1.51 09/24/2013   INR 1.02 09/28/2014     Ct Abdomen Pelvis W Contrast  09/28/2014  CLINICAL DATA:  Left lower quadrant pain, colonoscopy 09/27/2014. EXAM: CT ABDOMEN AND PELVIS WITH CONTRAST TECHNIQUE: Multidetector CT imaging of the abdomen and pelvis was performed using the standard protocol following bolus administration of intravenous contrast. CONTRAST:  149mL OMNIPAQUE IOHEXOL 300 MG/ML  SOLN COMPARISON:  08/20/2005 FINDINGS: Lung bases shows mild dependent atelectasis. Sagittal images of the spine are unremarkable. There is moderate peripelvic and perihepatic ascites. Patient is status post cholecystectomy. Mild intrahepatic biliary ductal dilatation. Perihepatic fluid measures 30 Hounsfield units in attenuation. Perisplenic fluid measures 44.5 Hounsfield units in attenuation highly suspicious for hemorrhagic fluid. There is low-attenuation lesion posterior aspect of the spleen axial image 13 and 14 Measures 1.7 cm. Findings are highly suspicious for focal splenic laceration with active contrast extravasation/hemorrhage. There is some free fluid in right paracolic gutter. Moderate pelvic ascites with hemorrhagic fluid measures 31.5 Hounsfield units in attenuation. There is no evidence of colonic perforation. No free abdominal air. There is a small left infraumbilical ventral hernia containing fat some fluid axial image 37 without evidence of acute complication measures about 1.8 cm. No small bowel obstruction. Mild thickening of jejunal wall axial image 40 may represent enteritis or minimal small bowel edema. There is no evidence of small bowel obstruction. Some colonic gas noted in cecum and sigmoid colon. The urinary bladder is unremarkable. The patient is status post hysterectomy. No pericecal inflammation.  Normal appendix. IMPRESSION: 1. There is perihepatic and perisplenic moderate ascites. Perisplenic hemoperitoneum. Low-density lesion within posterior aspect of the spleen measures 1.7 cm highly suspicious for small splenic laceration  with active contrast extravasation/hemorrhage. Perisplenic fluid measures 44.5 Hounsfield units in attenuation consistent with hemorrhagic products. Moderate pelvic ascites with hemorrhagic products. 2. There is no evidence of colonic perforation. Mild thickening of small bowel lobe may in jejunal region probable enteritis or mild small bowel wall edema. There is no evidence of small bowel obstruction. 3. Status post hysterectomy. Critical Value/emergent results were called by telephone at the time of interpretation on 09/28/2014 at 12:04 pm to Dr. Jola Schmidt , who verbally acknowledged these results. Normal appendix. No pericecal inflammation. Some residual colonic gas post colonoscopy yesterday. Electronically Signed   By: Lahoma Crocker M.D.   On: 09/28/2014 12:05   Ir Angiogram Visceral Selective  09/29/2014  CLINICAL DATA:  Iatrogenic splenic laceration after colonoscopy with hemo peritoneum. CT shows focal laceration along the medial aspect of the spleen superiorly. There is question of focal contrast extravasation by CT. EXAM: 1. ULTRASOUND GUIDANCE FOR VASCULAR ACCESS OF THE RIGHT COMMON FEMORAL ARTERY 2. SELECTIVE VISCERAL ARTERIOGRAPHY OF THE CELIAC AXIS 3. SELECTIVE ARTERIOGRAPHY OF THE SPLENIC ARTERY 4. SELECTIVE ARTERIOGRAPHY OF THIRD ORDER SPLENIC ARTERY BRANCH ANESTHESIA/SEDATION: 1.5 Mg IV Versed; 50 mcg IV Fentanyl. Total Moderate Sedation Time: 75  Minutes. CONTRAST:  65 mL Omnipaque 300 FLUOROSCOPY TIME:  19 minutes and 30 seconds PROCEDURE: The procedure, risks, benefits, and alternatives were explained to the patient. Questions regarding the procedure were encouraged and answered.  The patient understands and consents to the procedure. A time-out was performed prior to the procedure. The right groin was prepped with Betadine in a sterile fashion, and a sterile drape was applied covering the operative field. A sterile gown and sterile gloves were used for the procedure. Local anesthesia was provided  with 1% Lidocaine. After small skin incision, a 21 gauge needle was advanced into the right common femoral artery under direct ultrasound guidance. Ultrasound image documentation was performed. After establishing guide wire access, a 5-French sheath was placed. A 5 Fr diagnostic catheter was advanced over a guidewire into the abdominal aorta. The catheter was then used to selectively catheterize the celiac axis. Selective arteriography was performed. A micro catheter was then advanced into the splenic artery and additional selective arteriography performed. The micro catheter was further advanced into a superior splenic artery branch and additional arteriography performed. Attempts were made to advance various guidewires through the micro catheter into an additional splenic artery branch. Right femoral arteriotomy hemostasis: Cordis Exoseal. COMPLICATIONS: None. FINDINGS: Celiac arteriography demonstrates a very tortuous splenic artery and otherwise normal patent celiac branches including a patent left gastric artery, common hepatic artery and gastroduodenal artery. The splenic artery bifurcates into a dominant upper to mid division branch and a smaller lower pole branch. After advancing a micro catheter into the superior branch, selective arteriography demonstrates intraparenchymal arterial irregularity involving a medial superior splenic artery branch. This is in the exact location of the focal splenic laceration by CT. There appeared to be some potential hemorrhage into the substance of the spleen but no overt contrast extravasation either in the spleen or outside of the confines of the splenic parenchyma. Attempt was made to selectively catheterize the medial superior branch demonstrating irregularity. However, due to significant tortuosity at the origin of this branch, the vessel could not be selectively catheterized. Based on angiographic findings and inability to catheterize the smaller irregular branch  vessel, decision was made not to perform embolization today. Coil embolization would have compromised approximately 2/3 of the splenic parenchyma. IMPRESSION: Celiac and splenic arteriography demonstrates arterial irregularity involving a branch supplying the medial and superior spleen. This corresponds in location to the splenic laceration by CT. No contrast extravasation was identified by arteriography and attempts at catheterizing the smaller branch vessel were unsuccessful. Decision was made to not performed a non selective embolization at this time which would have compromised 2/3 of the splenic parenchyma. Findings were discussed with Dr. Hulen Skains immediately following the arteriographic procedure. Electronically Signed   By: Aletta Edouard M.D.   On: 09/29/2014 11:01   Ir Angiogram Selective Each Additional Vessel  09/29/2014  CLINICAL DATA:  Iatrogenic splenic laceration after colonoscopy with hemo peritoneum. CT shows focal laceration along the medial aspect of the spleen superiorly. There is question of focal contrast extravasation by CT. EXAM: 1. ULTRASOUND GUIDANCE FOR VASCULAR ACCESS OF THE RIGHT COMMON FEMORAL ARTERY 2. SELECTIVE VISCERAL ARTERIOGRAPHY OF THE CELIAC AXIS 3. SELECTIVE ARTERIOGRAPHY OF THE SPLENIC ARTERY 4. SELECTIVE ARTERIOGRAPHY OF THIRD ORDER SPLENIC ARTERY BRANCH ANESTHESIA/SEDATION: 1.5 Mg IV Versed; 50 mcg IV Fentanyl. Total Moderate Sedation Time: 75  Minutes. CONTRAST:  65 mL Omnipaque 300 FLUOROSCOPY TIME:  19 minutes and 30 seconds PROCEDURE: The procedure, risks, benefits, and alternatives were explained to the patient. Questions regarding the procedure were encouraged and answered. The patient understands and consents to the procedure. A time-out was performed prior to the procedure. The right groin was prepped with Betadine in a sterile fashion, and a  sterile drape was applied covering the operative field. A sterile gown and sterile gloves were used for the procedure. Local  anesthesia was provided with 1% Lidocaine. After small skin incision, a 21 gauge needle was advanced into the right common femoral artery under direct ultrasound guidance. Ultrasound image documentation was performed. After establishing guide wire access, a 5-French sheath was placed. A 5 Fr diagnostic catheter was advanced over a guidewire into the abdominal aorta. The catheter was then used to selectively catheterize the celiac axis. Selective arteriography was performed. A micro catheter was then advanced into the splenic artery and additional selective arteriography performed. The micro catheter was further advanced into a superior splenic artery branch and additional arteriography performed. Attempts were made to advance various guidewires through the micro catheter into an additional splenic artery branch. Right femoral arteriotomy hemostasis: Cordis Exoseal. COMPLICATIONS: None. FINDINGS: Celiac arteriography demonstrates a very tortuous splenic artery and otherwise normal patent celiac branches including a patent left gastric artery, common hepatic artery and gastroduodenal artery. The splenic artery bifurcates into a dominant upper to mid division branch and a smaller lower pole branch. After advancing a micro catheter into the superior branch, selective arteriography demonstrates intraparenchymal arterial irregularity involving a medial superior splenic artery branch. This is in the exact location of the focal splenic laceration by CT. There appeared to be some potential hemorrhage into the substance of the spleen but no overt contrast extravasation either in the spleen or outside of the confines of the splenic parenchyma. Attempt was made to selectively catheterize the medial superior branch demonstrating irregularity. However, due to significant tortuosity at the origin of this branch, the vessel could not be selectively catheterized. Based on angiographic findings and inability to catheterize the  smaller irregular branch vessel, decision was made not to perform embolization today. Coil embolization would have compromised approximately 2/3 of the splenic parenchyma. IMPRESSION: Celiac and splenic arteriography demonstrates arterial irregularity involving a branch supplying the medial and superior spleen. This corresponds in location to the splenic laceration by CT. No contrast extravasation was identified by arteriography and attempts at catheterizing the smaller branch vessel were unsuccessful. Decision was made to not performed a non selective embolization at this time which would have compromised 2/3 of the splenic parenchyma. Findings were discussed with Dr. Hulen Skains immediately following the arteriographic procedure. Electronically Signed   By: Aletta Edouard M.D.   On: 09/29/2014 11:01   Ir Angiogram Selective Each Additional Vessel  09/29/2014  CLINICAL DATA:  Iatrogenic splenic laceration after colonoscopy with hemo peritoneum. CT shows focal laceration along the medial aspect of the spleen superiorly. There is question of focal contrast extravasation by CT. EXAM: 1. ULTRASOUND GUIDANCE FOR VASCULAR ACCESS OF THE RIGHT COMMON FEMORAL ARTERY 2. SELECTIVE VISCERAL ARTERIOGRAPHY OF THE CELIAC AXIS 3. SELECTIVE ARTERIOGRAPHY OF THE SPLENIC ARTERY 4. SELECTIVE ARTERIOGRAPHY OF THIRD ORDER SPLENIC ARTERY BRANCH ANESTHESIA/SEDATION: 1.5 Mg IV Versed; 50 mcg IV Fentanyl. Total Moderate Sedation Time: 75  Minutes. CONTRAST:  65 mL Omnipaque 300 FLUOROSCOPY TIME:  19 minutes and 30 seconds PROCEDURE: The procedure, risks, benefits, and alternatives were explained to the patient. Questions regarding the procedure were encouraged and answered. The patient understands and consents to the procedure. A time-out was performed prior to the procedure. The right groin was prepped with Betadine in a sterile fashion, and a sterile drape was applied covering the operative field. A sterile gown and sterile gloves were used  for the procedure. Local anesthesia was provided with 1% Lidocaine. After small  skin incision, a 21 gauge needle was advanced into the right common femoral artery under direct ultrasound guidance. Ultrasound image documentation was performed. After establishing guide wire access, a 5-French sheath was placed. A 5 Fr diagnostic catheter was advanced over a guidewire into the abdominal aorta. The catheter was then used to selectively catheterize the celiac axis. Selective arteriography was performed. A micro catheter was then advanced into the splenic artery and additional selective arteriography performed. The micro catheter was further advanced into a superior splenic artery branch and additional arteriography performed. Attempts were made to advance various guidewires through the micro catheter into an additional splenic artery branch. Right femoral arteriotomy hemostasis: Cordis Exoseal. COMPLICATIONS: None. FINDINGS: Celiac arteriography demonstrates a very tortuous splenic artery and otherwise normal patent celiac branches including a patent left gastric artery, common hepatic artery and gastroduodenal artery. The splenic artery bifurcates into a dominant upper to mid division branch and a smaller lower pole branch. After advancing a micro catheter into the superior branch, selective arteriography demonstrates intraparenchymal arterial irregularity involving a medial superior splenic artery branch. This is in the exact location of the focal splenic laceration by CT. There appeared to be some potential hemorrhage into the substance of the spleen but no overt contrast extravasation either in the spleen or outside of the confines of the splenic parenchyma. Attempt was made to selectively catheterize the medial superior branch demonstrating irregularity. However, due to significant tortuosity at the origin of this branch, the vessel could not be selectively catheterized. Based on angiographic findings and inability  to catheterize the smaller irregular branch vessel, decision was made not to perform embolization today. Coil embolization would have compromised approximately 2/3 of the splenic parenchyma. IMPRESSION: Celiac and splenic arteriography demonstrates arterial irregularity involving a branch supplying the medial and superior spleen. This corresponds in location to the splenic laceration by CT. No contrast extravasation was identified by arteriography and attempts at catheterizing the smaller branch vessel were unsuccessful. Decision was made to not performed a non selective embolization at this time which would have compromised 2/3 of the splenic parenchyma. Findings were discussed with Dr. Hulen Skains immediately following the arteriographic procedure. Electronically Signed   By: Aletta Edouard M.D.   On: 09/29/2014 11:01   Ir US Guide Vasc Access Right  09/29/2014  CLINICAL DATA:  Iatrogenic splenic laceration after colonoscopy with hemo peritoneum. CT shows focal laceration along the medial aspect of the spleen superiorly. There is question of focal contrast extravasation by CT. EXAM: 1. ULTRASOUND GUIDANCE FOR VASCULAR ACCESS OF THE RIGHT COMMON FEMORAL ARTERY 2. SELECTIVE VISCERAL ARTERIOGRAPHY OF THE CELIAC AXIS 3. SELECTIVE ARTERIOGRAPHY OF THE SPLENIC ARTERY 4. SELECTIVE ARTERIOGRAPHY OF THIRD ORDER SPLENIC ARTERY BRANCH ANESTHESIA/SEDATION: 1.5 Mg IV Versed; 50 mcg IV Fentanyl. Total Moderate Sedation Time: 75  Minutes. CONTRAST:  65 mL Omnipaque 300 FLUOROSCOPY TIME:  19 minutes and 30 seconds PROCEDURE: The procedure, risks, benefits, and alternatives were explained to the patient. Questions regarding the procedure were encouraged and answered. The patient understands and consents to the procedure. A time-out was performed prior to the procedure. The right groin was prepped with Betadine in a sterile fashion, and a sterile drape was applied covering the operative field. A sterile gown and sterile gloves were  used for the procedure. Local anesthesia was provided with 1% Lidocaine. After small skin incision, a 21 gauge needle was advanced into the right common femoral artery under direct ultrasound guidance. Ultrasound image documentation was performed. After establishing guide wire access, a  5-French sheath was placed. A 5 Fr diagnostic catheter was advanced over a guidewire into the abdominal aorta. The catheter was then used to selectively catheterize the celiac axis. Selective arteriography was performed. A micro catheter was then advanced into the splenic artery and additional selective arteriography performed. The micro catheter was further advanced into a superior splenic artery branch and additional arteriography performed. Attempts were made to advance various guidewires through the micro catheter into an additional splenic artery branch. Right femoral arteriotomy hemostasis: Cordis Exoseal. COMPLICATIONS: None. FINDINGS: Celiac arteriography demonstrates a very tortuous splenic artery and otherwise normal patent celiac branches including a patent left gastric artery, common hepatic artery and gastroduodenal artery. The splenic artery bifurcates into a dominant upper to mid division branch and a smaller lower pole branch. After advancing a micro catheter into the superior branch, selective arteriography demonstrates intraparenchymal arterial irregularity involving a medial superior splenic artery branch. This is in the exact location of the focal splenic laceration by CT. There appeared to be some potential hemorrhage into the substance of the spleen but no overt contrast extravasation either in the spleen or outside of the confines of the splenic parenchyma. Attempt was made to selectively catheterize the medial superior branch demonstrating irregularity. However, due to significant tortuosity at the origin of this branch, the vessel could not be selectively catheterized. Based on angiographic findings and  inability to catheterize the smaller irregular branch vessel, decision was made not to perform embolization today. Coil embolization would have compromised approximately 2/3 of the splenic parenchyma. IMPRESSION: Celiac and splenic arteriography demonstrates arterial irregularity involving a branch supplying the medial and superior spleen. This corresponds in location to the splenic laceration by CT. No contrast extravasation was identified by arteriography and attempts at catheterizing the smaller branch vessel were unsuccessful. Decision was made to not performed a non selective embolization at this time which would have compromised 2/3 of the splenic parenchyma. Findings were discussed with Dr. Hulen Skains immediately following the arteriographic procedure. Electronically Signed   By: Aletta Edouard M.D.   On: 09/29/2014 11:01    Assessment & Plan:   There are no diagnoses linked to this encounter. I am having Ms. Krahn maintain her estrogens (conjugated), amphetamine-dextroamphetamine, Probiotic Product (PROBIOTIC DAILY PO), loratadine, butalbital-acetaminophen-caffeine, busPIRone, and Eluxadoline.  No orders of the defined types were placed in this encounter.     Follow-up: No Follow-up on file.  Walker Kehr, MD

## 2015-04-05 NOTE — Assessment & Plan Note (Signed)
3/17 recurrent Imitrex, Zofran and diclofenac prn

## 2015-04-05 NOTE — Progress Notes (Signed)
Pre visit review using our clinic review tool, if applicable. No additional management support is needed unless otherwise documented below in the visit note. 

## 2015-04-05 NOTE — Patient Instructions (Signed)

## 2015-04-05 NOTE — Assessment & Plan Note (Signed)
Better on Buspar

## 2015-04-05 NOTE — Assessment & Plan Note (Signed)
Buspar - helps w/IBS-D too

## 2015-04-06 ENCOUNTER — Telehealth: Payer: Self-pay | Admitting: Internal Medicine

## 2015-04-06 NOTE — Telephone Encounter (Signed)
[facility] on [address] called regarding prescription SUMAtriptan (IMITREX) 100 MG tablet [id number]  Chart shows an allergie to sulfa and this prescription has a small amount. She is wondering if its ok to fill

## 2015-04-06 NOTE — Telephone Encounter (Signed)
Ok to fill Thx 

## 2015-04-06 NOTE — Telephone Encounter (Signed)
Notified pharmacist Alyse Low) w/MD response...Shelly Green

## 2015-05-29 ENCOUNTER — Ambulatory Visit (INDEPENDENT_AMBULATORY_CARE_PROVIDER_SITE_OTHER): Payer: Managed Care, Other (non HMO) | Admitting: Obstetrics

## 2015-05-29 VITALS — BP 159/82 | HR 76 | Wt 126.0 lb

## 2015-05-29 DIAGNOSIS — N951 Menopausal and female climacteric states: Secondary | ICD-10-CM | POA: Diagnosis not present

## 2015-05-29 DIAGNOSIS — Z79890 Hormone replacement therapy (postmenopausal): Secondary | ICD-10-CM

## 2015-05-29 MED ORDER — ESTROGENS CONJUGATED 0.625 MG PO TABS: 0.6250 mg | ORAL_TABLET | Freq: Every day | ORAL | Status: DC

## 2015-05-30 ENCOUNTER — Encounter: Payer: Self-pay | Admitting: Obstetrics

## 2015-05-30 NOTE — Progress Notes (Signed)
Patient ID: Shelly Green, female   DOB: 10/07/1960, 55 y.o.   MRN: [id number]  Chief Complaint  Patient presents with  . Office Visit    Consult    HPI Shelly Green is a 55 y.o. female.  Postmenopausal.  On Premarin 1.25mg  daily.  Doing well.  Wants to try and decrease dosage.  HPI  Past Medical History  Diagnosis Date  . IBS (irritable bowel syndrome)   . Seasonal allergies   . Arthritis   . Migraines   . History of colon polyps   . Menopausal disorder   . Social anxiety disorder     Past Surgical History  Procedure Laterality Date  . Laparoscopic cholecystectomy  2007  . Total abdominal hysterectomy    . Hemorroidectomy    . Carpal tunnel release  08/20/2011    Procedure: CARPAL TUNNEL RELEASE;  Surgeon: Cammie Sickle., MD;  Location: [facility];  Service: Orthopedics;  Laterality: Right;    Family History  Problem Relation Age of Onset  . Arthritis Mother   . Colon cancer Sister 89  . Mental illness Daughter   . Mental illness Maternal Aunt   . Arthritis Maternal Grandmother   . Hypertension Maternal Grandfather   . Lymphoma Mother     Social History Social History  Substance Use Topics  . Smoking status: Current Every Day Smoker -- 1.50 packs/day for 35 years    Types: Cigarettes    Last Attempt to Quit: 06/21/2013  . Smokeless tobacco: Never Used  . Alcohol Use: No    Allergies  Allergen Reactions  . Codeine Anaphylaxis  . Hydrocodone Shortness Of Breath  . Penicillins Anaphylaxis  . Sulfa Antibiotics Anaphylaxis and Rash  . Tramadol Shortness Of Breath    Current Outpatient Prescriptions  Medication Sig Dispense Refill  . amphetamine-dextroamphetamine (ADDERALL) 20 MG tablet Take 20 mg by mouth 3 (three) times daily.  0  . busPIRone (BUSPAR) 15 MG tablet Take 1 tablet (15 mg total) by mouth 2 (two) times daily. 60 tablet 5  . butalbital-acetaminophen-caffeine (FIORICET, ESGIC) 50-325-40 MG per tablet TAKE 2 TABLETS BY  MOUTH EVERY 6 HOURS AS NEEDED 40 tablet 2  . diclofenac (VOLTAREN) 75 MG EC tablet Take 1 tablet (75 mg total) by mouth 2 (two) times daily as needed (headache). 60 tablet 2  . loratadine (CLARITIN) 10 MG tablet Take 10 mg by mouth daily.    . SUMAtriptan (IMITREX) 100 MG tablet Take 1 tablet (100 mg total) by mouth daily as needed for migraine. May repeat in 2 hours if headache persists or recurs. 12 tablet 3  . Eluxadoline (VIBERZI) 75 MG TABS Take 75 mg by mouth 2 (two) times daily. (Patient not taking: Reported on 05/29/2015) 60 tablet 5  . estrogens, conjugated, (PREMARIN) 0.625 MG tablet Take 1 tablet (0.625 mg total) by mouth daily. Take 1 tablet po daily. 30 tablet 11   No current facility-administered medications for this visit.    Review of Systems Review of Systems Constitutional: negative for fatigue and weight loss Respiratory: negative for cough and wheezing Cardiovascular: negative for chest pain, fatigue and palpitations Gastrointestinal: negative for abdominal pain and change in bowel habits Genitourinary: negative for hot flashes and vaginal dryness Integument/breast: negative for nipple discharge Musculoskeletal:negative for myalgias Neurological: negative for gait problems and tremors Behavioral/Psych: negative for abusive relationship, depression Endocrine: negative for temperature intolerance     Blood pressure 159/82, pulse 76, weight 126 lb (57.153 kg).  Physical  Exam Physical Exam:  Deferred  100% of 10 min visit spent on counseling and coordination of care.   Data Reviewed Labs Radiology  Assessment     Postmenopausal symptoms.  Improved on Premarin.        Plan    Premarin decreased to 0.625mg  po daily.  F/U in 6 months.  No orders of the defined types were placed in this encounter.   Meds ordered this encounter  Medications  . estrogens, conjugated, (PREMARIN) 0.625 MG tablet    Sig: Take 1 tablet (0.625 mg total) by mouth daily. Take 1 tablet  po daily.    Dispense:  30 tablet    Refill:  11

## 2015-06-20 ENCOUNTER — Other Ambulatory Visit: Payer: Self-pay | Admitting: *Deleted

## 2015-06-20 DIAGNOSIS — Z79890 Hormone replacement therapy (postmenopausal): Secondary | ICD-10-CM

## 2015-06-20 MED ORDER — ESTROGENS CONJUGATED 0.625 MG PO TABS: 0.6250 mg | ORAL_TABLET | Freq: Every day | ORAL | Status: DC

## 2015-07-26 ENCOUNTER — Ambulatory Visit: Payer: Managed Care, Other (non HMO) | Admitting: Internal Medicine

## 2015-08-08 ENCOUNTER — Ambulatory Visit: Payer: Managed Care, Other (non HMO) | Admitting: Internal Medicine

## 2015-08-23 ENCOUNTER — Ambulatory Visit (INDEPENDENT_AMBULATORY_CARE_PROVIDER_SITE_OTHER)
Admission: RE | Admit: 2015-08-23 | Discharge: 2015-08-23 | Disposition: A | Discharge status: Home / Self Care | Payer: Managed Care, Other (non HMO) | Source: Ambulatory Visit | Attending: Internal Medicine | Admitting: Internal Medicine

## 2015-08-23 ENCOUNTER — Encounter: Payer: Self-pay | Admitting: Internal Medicine

## 2015-08-23 ENCOUNTER — Ambulatory Visit (INDEPENDENT_AMBULATORY_CARE_PROVIDER_SITE_OTHER): Payer: Managed Care, Other (non HMO) | Admitting: Internal Medicine

## 2015-08-23 DIAGNOSIS — R05 Cough: Secondary | ICD-10-CM | POA: Diagnosis not present

## 2015-08-23 DIAGNOSIS — R059 Cough, unspecified: Secondary | ICD-10-CM

## 2015-08-23 DIAGNOSIS — K589 Irritable bowel syndrome without diarrhea: Secondary | ICD-10-CM | POA: Diagnosis not present

## 2015-08-23 DIAGNOSIS — N63 Unspecified lump in unspecified breast: Secondary | ICD-10-CM

## 2015-08-23 DIAGNOSIS — G43109 Migraine with aura, not intractable, without status migrainosus: Secondary | ICD-10-CM | POA: Diagnosis not present

## 2015-08-23 MED ORDER — VITAMIN D 1000 UNITS PO TABS: 1000.0000 [IU] | ORAL_TABLET | Freq: Every day | ORAL | 3 refills | Status: AC

## 2015-08-23 NOTE — Assessment & Plan Note (Signed)
Worse on Viibrid - being tapered off On Buspar

## 2015-08-23 NOTE — Progress Notes (Signed)
Subjective:  Patient ID: Shelly Green, female    DOB: 12/20/1960  Age: 55 y.o. MRN: [id number]  CC: No chief complaint on file.   HPI Shelly Green presents for a depression f/u (getting off Viibrid). Not taking Viberzi for ?reason. Dr Silvio Pate is back treating the pt for her psychiatric issues C/o L breast lump - new. C/o cough  Outpatient Medications Prior to Visit  Medication Sig Dispense Refill  . amphetamine-dextroamphetamine (ADDERALL) 20 MG tablet Take 20 mg by mouth 3 (three) times daily.  0  . busPIRone (BUSPAR) 15 MG tablet Take 1 tablet (15 mg total) by mouth 2 (two) times daily. 60 tablet 5  . butalbital-acetaminophen-caffeine (FIORICET, ESGIC) 50-325-40 MG per tablet TAKE 2 TABLETS BY MOUTH EVERY 6 HOURS AS NEEDED 40 tablet 2  . Eluxadoline (VIBERZI) 75 MG TABS Take 75 mg by mouth 2 (two) times daily. 60 tablet 5  . estrogens, conjugated, (PREMARIN) 0.625 MG tablet Take 1 tablet (0.625 mg total) by mouth daily. Take 1 tablet po daily. 30 tablet 11  . loratadine (CLARITIN) 10 MG tablet Take 10 mg by mouth daily.    . SUMAtriptan (IMITREX) 100 MG tablet Take 1 tablet (100 mg total) by mouth daily as needed for migraine. May repeat in 2 hours if headache persists or recurs. 12 tablet 3  . diclofenac (VOLTAREN) 75 MG EC tablet Take 1 tablet (75 mg total) by mouth 2 (two) times daily as needed (headache). (Patient not taking: Reported on 08/23/2015) 60 tablet 2   No facility-administered medications prior to visit.     ROS Review of Systems  Constitutional: Negative for activity change, appetite change, chills, fatigue and unexpected weight change.  HENT: Negative for congestion, mouth sores and sinus pressure.   Eyes: Negative for visual disturbance.  Respiratory: Positive for cough. Negative for chest tightness.   Gastrointestinal: Positive for diarrhea. Negative for abdominal pain and nausea.  Genitourinary: Negative for difficulty urinating, frequency and vaginal pain.    Musculoskeletal: Negative for back pain and gait problem.  Skin: Negative for pallor and rash.  Neurological: Negative for dizziness, tremors, weakness, numbness and headaches.  Psychiatric/Behavioral: Positive for decreased concentration. Negative for confusion and sleep disturbance.    Objective:  BP (!) 148/100   Pulse 96   Wt 128 lb (58.1 kg)   SpO2 96%   BMI 22.67 kg/m   BP Readings from Last 3 Encounters:  08/23/15 (!) 148/100  05/29/15 (!) 159/82  04/05/15 (!) 154/96    Wt Readings from Last 3 Encounters:  08/23/15 128 lb (58.1 kg)  05/29/15 126 lb (57.2 kg)  04/05/15 125 lb (56.7 kg)    Physical Exam  Constitutional: She appears well-developed. No distress.  HENT:  Head: Normocephalic.  Right Ear: External ear normal.  Left Ear: External ear normal.  Nose: Nose normal.  Mouth/Throat: Oropharynx is clear and moist.  Eyes: Conjunctivae are normal. Pupils are equal, round, and reactive to light. Right eye exhibits no discharge. Left eye exhibits no discharge.  Neck: Normal range of motion. Neck supple. No JVD present. No tracheal deviation present. No thyromegaly present.  Cardiovascular: Normal rate, regular rhythm and normal heart sounds.   Pulmonary/Chest: No stridor. No respiratory distress. She has no wheezes.  Abdominal: Soft. Bowel sounds are normal. She exhibits no distension and no mass. There is no tenderness. There is no rebound and no guarding.  Musculoskeletal: She exhibits no edema or tenderness.  Lymphadenopathy:    She has no cervical  adenopathy.  Neurological: She displays normal reflexes. No cranial nerve deficit. She exhibits normal muscle tone. Coordination normal.  Skin: No rash noted. No erythema.  Psychiatric: She has a normal mood and affect. Her behavior is normal. Judgment and thought content normal.   L breast w/3-4 oval lumps <1 cm at 3, 11, 1 o'clock R breast w/fibrocystic changes   Lab Results  Component Value Date   WBC 8.4  09/30/2014   HGB 10.6 (L) 09/30/2014   HCT 31.8 (L) 09/30/2014   PLT 158 09/30/2014   GLUCOSE 130 (H) 09/29/2014   CHOL 186 05/14/2013   TRIG 199.0 (H) 05/14/2013   HDL 37.30 (L) 05/14/2013   LDLCALC 109 (H) 05/14/2013   ALT 10 (L) 09/28/2014   AST 18 09/28/2014   NA 138 09/29/2014   K 3.6 09/29/2014   CL 107 09/29/2014   CREATININE 0.51 09/29/2014   BUN <5 (L) 09/29/2014   CO2 26 09/29/2014   TSH 1.51 09/24/2013   INR 1.02 09/28/2014    Ct Abdomen Pelvis W Contrast  Result Date: 09/28/2014 CLINICAL DATA:  Left lower quadrant pain, colonoscopy 09/27/2014. EXAM: CT ABDOMEN AND PELVIS WITH CONTRAST TECHNIQUE: Multidetector CT imaging of the abdomen and pelvis was performed using the standard protocol following bolus administration of intravenous contrast. CONTRAST:  163mL OMNIPAQUE IOHEXOL 300 MG/ML  SOLN COMPARISON:  08/20/2005 FINDINGS: Lung bases shows mild dependent atelectasis. Sagittal images of the spine are unremarkable. There is moderate peripelvic and perihepatic ascites. Patient is status post cholecystectomy. Mild intrahepatic biliary ductal dilatation. Perihepatic fluid measures 30 Hounsfield units in attenuation. Perisplenic fluid measures 44.5 Hounsfield units in attenuation highly suspicious for hemorrhagic fluid. There is low-attenuation lesion posterior aspect of the spleen axial image 13 and 14 Measures 1.7 cm. Findings are highly suspicious for focal splenic laceration with active contrast extravasation/hemorrhage. There is some free fluid in right paracolic gutter. Moderate pelvic ascites with hemorrhagic fluid measures 31.5 Hounsfield units in attenuation. There is no evidence of colonic perforation. No free abdominal air. There is a small left infraumbilical ventral hernia containing fat some fluid axial image 37 without evidence of acute complication measures about 1.8 cm. No small bowel obstruction. Mild thickening of jejunal wall axial image 40 may represent enteritis or  minimal small bowel edema. There is no evidence of small bowel obstruction. Some colonic gas noted in cecum and sigmoid colon. The urinary bladder is unremarkable. The patient is status post hysterectomy. No pericecal inflammation.  Normal appendix. IMPRESSION: 1. There is perihepatic and perisplenic moderate ascites. Perisplenic hemoperitoneum. Low-density lesion within posterior aspect of the spleen measures 1.7 cm highly suspicious for small splenic laceration with active contrast extravasation/hemorrhage. Perisplenic fluid measures 44.5 Hounsfield units in attenuation consistent with hemorrhagic products. Moderate pelvic ascites with hemorrhagic products. 2. There is no evidence of colonic perforation. Mild thickening of small bowel lobe may in jejunal region probable enteritis or mild small bowel wall edema. There is no evidence of small bowel obstruction. 3. Status post hysterectomy. Critical Value/emergent results were called by telephone at the time of interpretation on 09/28/2014 at 12:04 pm to Dr. Jola Schmidt , who verbally acknowledged these results. Normal appendix. No pericecal inflammation. Some residual colonic gas post colonoscopy yesterday. Electronically Signed   By: Lahoma Crocker M.D.   On: 09/28/2014 12:05   Ir Angiogram Visceral Selective  Result Date: 09/29/2014 CLINICAL DATA:  Iatrogenic splenic laceration after colonoscopy with hemo peritoneum. CT shows focal laceration along the medial aspect of the spleen superiorly.  There is question of focal contrast extravasation by CT. EXAM: 1. ULTRASOUND GUIDANCE FOR VASCULAR ACCESS OF THE RIGHT COMMON FEMORAL ARTERY 2. SELECTIVE VISCERAL ARTERIOGRAPHY OF THE CELIAC AXIS 3. SELECTIVE ARTERIOGRAPHY OF THE SPLENIC ARTERY 4. SELECTIVE ARTERIOGRAPHY OF THIRD ORDER SPLENIC ARTERY BRANCH ANESTHESIA/SEDATION: 1.5 Mg IV Versed; 50 mcg IV Fentanyl. Total Moderate Sedation Time: 75  Minutes. CONTRAST:  65 mL Omnipaque 300 FLUOROSCOPY TIME:  19 minutes and 30  seconds PROCEDURE: The procedure, risks, benefits, and alternatives were explained to the patient. Questions regarding the procedure were encouraged and answered. The patient understands and consents to the procedure. A time-out was performed prior to the procedure. The right groin was prepped with Betadine in a sterile fashion, and a sterile drape was applied covering the operative field. A sterile gown and sterile gloves were used for the procedure. Local anesthesia was provided with 1% Lidocaine. After small skin incision, a 21 gauge needle was advanced into the right common femoral artery under direct ultrasound guidance. Ultrasound image documentation was performed. After establishing guide wire access, a 5-French sheath was placed. A 5 Fr diagnostic catheter was advanced over a guidewire into the abdominal aorta. The catheter was then used to selectively catheterize the celiac axis. Selective arteriography was performed. A micro catheter was then advanced into the splenic artery and additional selective arteriography performed. The micro catheter was further advanced into a superior splenic artery branch and additional arteriography performed. Attempts were made to advance various guidewires through the micro catheter into an additional splenic artery branch. Right femoral arteriotomy hemostasis: Cordis Exoseal. COMPLICATIONS: None. FINDINGS: Celiac arteriography demonstrates a very tortuous splenic artery and otherwise normal patent celiac branches including a patent left gastric artery, common hepatic artery and gastroduodenal artery. The splenic artery bifurcates into a dominant upper to mid division branch and a smaller lower pole branch. After advancing a micro catheter into the superior branch, selective arteriography demonstrates intraparenchymal arterial irregularity involving a medial superior splenic artery branch. This is in the exact location of the focal splenic laceration by CT. There appeared to  be some potential hemorrhage into the substance of the spleen but no overt contrast extravasation either in the spleen or outside of the confines of the splenic parenchyma. Attempt was made to selectively catheterize the medial superior branch demonstrating irregularity. However, due to significant tortuosity at the origin of this branch, the vessel could not be selectively catheterized. Based on angiographic findings and inability to catheterize the smaller irregular branch vessel, decision was made not to perform embolization today. Coil embolization would have compromised approximately 2/3 of the splenic parenchyma. IMPRESSION: Celiac and splenic arteriography demonstrates arterial irregularity involving a branch supplying the medial and superior spleen. This corresponds in location to the splenic laceration by CT. No contrast extravasation was identified by arteriography and attempts at catheterizing the smaller branch vessel were unsuccessful. Decision was made to not performed a non selective embolization at this time which would have compromised 2/3 of the splenic parenchyma. Findings were discussed with Dr. Hulen Skains immediately following the arteriographic procedure. Electronically Signed   By: Aletta Edouard M.D.   On: 09/29/2014 11:01   Ir Angiogram Selective Each Additional Vessel  Result Date: 09/29/2014 CLINICAL DATA:  Iatrogenic splenic laceration after colonoscopy with hemo peritoneum. CT shows focal laceration along the medial aspect of the spleen superiorly. There is question of focal contrast extravasation by CT. EXAM: 1. ULTRASOUND GUIDANCE FOR VASCULAR ACCESS OF THE RIGHT COMMON FEMORAL ARTERY 2. SELECTIVE VISCERAL ARTERIOGRAPHY OF THE  CELIAC AXIS 3. SELECTIVE ARTERIOGRAPHY OF THE SPLENIC ARTERY 4. SELECTIVE ARTERIOGRAPHY OF THIRD ORDER SPLENIC ARTERY BRANCH ANESTHESIA/SEDATION: 1.5 Mg IV Versed; 50 mcg IV Fentanyl. Total Moderate Sedation Time: 75  Minutes. CONTRAST:  65 mL Omnipaque 300  FLUOROSCOPY TIME:  19 minutes and 30 seconds PROCEDURE: The procedure, risks, benefits, and alternatives were explained to the patient. Questions regarding the procedure were encouraged and answered. The patient understands and consents to the procedure. A time-out was performed prior to the procedure. The right groin was prepped with Betadine in a sterile fashion, and a sterile drape was applied covering the operative field. A sterile gown and sterile gloves were used for the procedure. Local anesthesia was provided with 1% Lidocaine. After small skin incision, a 21 gauge needle was advanced into the right common femoral artery under direct ultrasound guidance. Ultrasound image documentation was performed. After establishing guide wire access, a 5-French sheath was placed. A 5 Fr diagnostic catheter was advanced over a guidewire into the abdominal aorta. The catheter was then used to selectively catheterize the celiac axis. Selective arteriography was performed. A micro catheter was then advanced into the splenic artery and additional selective arteriography performed. The micro catheter was further advanced into a superior splenic artery branch and additional arteriography performed. Attempts were made to advance various guidewires through the micro catheter into an additional splenic artery branch. Right femoral arteriotomy hemostasis: Cordis Exoseal. COMPLICATIONS: None. FINDINGS: Celiac arteriography demonstrates a very tortuous splenic artery and otherwise normal patent celiac branches including a patent left gastric artery, common hepatic artery and gastroduodenal artery. The splenic artery bifurcates into a dominant upper to mid division branch and a smaller lower pole branch. After advancing a micro catheter into the superior branch, selective arteriography demonstrates intraparenchymal arterial irregularity involving a medial superior splenic artery branch. This is in the exact location of the focal  splenic laceration by CT. There appeared to be some potential hemorrhage into the substance of the spleen but no overt contrast extravasation either in the spleen or outside of the confines of the splenic parenchyma. Attempt was made to selectively catheterize the medial superior branch demonstrating irregularity. However, due to significant tortuosity at the origin of this branch, the vessel could not be selectively catheterized. Based on angiographic findings and inability to catheterize the smaller irregular branch vessel, decision was made not to perform embolization today. Coil embolization would have compromised approximately 2/3 of the splenic parenchyma. IMPRESSION: Celiac and splenic arteriography demonstrates arterial irregularity involving a branch supplying the medial and superior spleen. This corresponds in location to the splenic laceration by CT. No contrast extravasation was identified by arteriography and attempts at catheterizing the smaller branch vessel were unsuccessful. Decision was made to not performed a non selective embolization at this time which would have compromised 2/3 of the splenic parenchyma. Findings were discussed with Dr. Hulen Skains immediately following the arteriographic procedure. Electronically Signed   By: Aletta Edouard M.D.   On: 09/29/2014 11:01   Ir Angiogram Selective Each Additional Vessel  Result Date: 09/29/2014 CLINICAL DATA:  Iatrogenic splenic laceration after colonoscopy with hemo peritoneum. CT shows focal laceration along the medial aspect of the spleen superiorly. There is question of focal contrast extravasation by CT. EXAM: 1. ULTRASOUND GUIDANCE FOR VASCULAR ACCESS OF THE RIGHT COMMON FEMORAL ARTERY 2. SELECTIVE VISCERAL ARTERIOGRAPHY OF THE CELIAC AXIS 3. SELECTIVE ARTERIOGRAPHY OF THE SPLENIC ARTERY 4. SELECTIVE ARTERIOGRAPHY OF THIRD ORDER SPLENIC ARTERY BRANCH ANESTHESIA/SEDATION: 1.5 Mg IV Versed; 50 mcg IV Fentanyl. Total  Moderate Sedation Time: 75   Minutes. CONTRAST:  65 mL Omnipaque 300 FLUOROSCOPY TIME:  19 minutes and 30 seconds PROCEDURE: The procedure, risks, benefits, and alternatives were explained to the patient. Questions regarding the procedure were encouraged and answered. The patient understands and consents to the procedure. A time-out was performed prior to the procedure. The right groin was prepped with Betadine in a sterile fashion, and a sterile drape was applied covering the operative field. A sterile gown and sterile gloves were used for the procedure. Local anesthesia was provided with 1% Lidocaine. After small skin incision, a 21 gauge needle was advanced into the right common femoral artery under direct ultrasound guidance. Ultrasound image documentation was performed. After establishing guide wire access, a 5-French sheath was placed. A 5 Fr diagnostic catheter was advanced over a guidewire into the abdominal aorta. The catheter was then used to selectively catheterize the celiac axis. Selective arteriography was performed. A micro catheter was then advanced into the splenic artery and additional selective arteriography performed. The micro catheter was further advanced into a superior splenic artery branch and additional arteriography performed. Attempts were made to advance various guidewires through the micro catheter into an additional splenic artery branch. Right femoral arteriotomy hemostasis: Cordis Exoseal. COMPLICATIONS: None. FINDINGS: Celiac arteriography demonstrates a very tortuous splenic artery and otherwise normal patent celiac branches including a patent left gastric artery, common hepatic artery and gastroduodenal artery. The splenic artery bifurcates into a dominant upper to mid division branch and a smaller lower pole branch. After advancing a micro catheter into the superior branch, selective arteriography demonstrates intraparenchymal arterial irregularity involving a medial superior splenic artery branch. This is  in the exact location of the focal splenic laceration by CT. There appeared to be some potential hemorrhage into the substance of the spleen but no overt contrast extravasation either in the spleen or outside of the confines of the splenic parenchyma. Attempt was made to selectively catheterize the medial superior branch demonstrating irregularity. However, due to significant tortuosity at the origin of this branch, the vessel could not be selectively catheterized. Based on angiographic findings and inability to catheterize the smaller irregular branch vessel, decision was made not to perform embolization today. Coil embolization would have compromised approximately 2/3 of the splenic parenchyma. IMPRESSION: Celiac and splenic arteriography demonstrates arterial irregularity involving a branch supplying the medial and superior spleen. This corresponds in location to the splenic laceration by CT. No contrast extravasation was identified by arteriography and attempts at catheterizing the smaller branch vessel were unsuccessful. Decision was made to not performed a non selective embolization at this time which would have compromised 2/3 of the splenic parenchyma. Findings were discussed with Dr. Hulen Skains immediately following the arteriographic procedure. Electronically Signed   By: Aletta Edouard M.D.   On: 09/29/2014 11:01   Ir US Guide Vasc Access Right  Result Date: 09/29/2014 CLINICAL DATA:  Iatrogenic splenic laceration after colonoscopy with hemo peritoneum. CT shows focal laceration along the medial aspect of the spleen superiorly. There is question of focal contrast extravasation by CT. EXAM: 1. ULTRASOUND GUIDANCE FOR VASCULAR ACCESS OF THE RIGHT COMMON FEMORAL ARTERY 2. SELECTIVE VISCERAL ARTERIOGRAPHY OF THE CELIAC AXIS 3. SELECTIVE ARTERIOGRAPHY OF THE SPLENIC ARTERY 4. SELECTIVE ARTERIOGRAPHY OF THIRD ORDER SPLENIC ARTERY BRANCH ANESTHESIA/SEDATION: 1.5 Mg IV Versed; 50 mcg IV Fentanyl. Total Moderate  Sedation Time: 75  Minutes. CONTRAST:  65 mL Omnipaque 300 FLUOROSCOPY TIME:  19 minutes and 30 seconds PROCEDURE: The procedure, risks, benefits, and alternatives  were explained to the patient. Questions regarding the procedure were encouraged and answered. The patient understands and consents to the procedure. A time-out was performed prior to the procedure. The right groin was prepped with Betadine in a sterile fashion, and a sterile drape was applied covering the operative field. A sterile gown and sterile gloves were used for the procedure. Local anesthesia was provided with 1% Lidocaine. After small skin incision, a 21 gauge needle was advanced into the right common femoral artery under direct ultrasound guidance. Ultrasound image documentation was performed. After establishing guide wire access, a 5-French sheath was placed. A 5 Fr diagnostic catheter was advanced over a guidewire into the abdominal aorta. The catheter was then used to selectively catheterize the celiac axis. Selective arteriography was performed. A micro catheter was then advanced into the splenic artery and additional selective arteriography performed. The micro catheter was further advanced into a superior splenic artery branch and additional arteriography performed. Attempts were made to advance various guidewires through the micro catheter into an additional splenic artery branch. Right femoral arteriotomy hemostasis: Cordis Exoseal. COMPLICATIONS: None. FINDINGS: Celiac arteriography demonstrates a very tortuous splenic artery and otherwise normal patent celiac branches including a patent left gastric artery, common hepatic artery and gastroduodenal artery. The splenic artery bifurcates into a dominant upper to mid division branch and a smaller lower pole branch. After advancing a micro catheter into the superior branch, selective arteriography demonstrates intraparenchymal arterial irregularity involving a medial superior splenic  artery branch. This is in the exact location of the focal splenic laceration by CT. There appeared to be some potential hemorrhage into the substance of the spleen but no overt contrast extravasation either in the spleen or outside of the confines of the splenic parenchyma. Attempt was made to selectively catheterize the medial superior branch demonstrating irregularity. However, due to significant tortuosity at the origin of this branch, the vessel could not be selectively catheterized. Based on angiographic findings and inability to catheterize the smaller irregular branch vessel, decision was made not to perform embolization today. Coil embolization would have compromised approximately 2/3 of the splenic parenchyma. IMPRESSION: Celiac and splenic arteriography demonstrates arterial irregularity involving a branch supplying the medial and superior spleen. This corresponds in location to the splenic laceration by CT. No contrast extravasation was identified by arteriography and attempts at catheterizing the smaller branch vessel were unsuccessful. Decision was made to not performed a non selective embolization at this time which would have compromised 2/3 of the splenic parenchyma. Findings were discussed with Dr. Hulen Skains immediately following the arteriographic procedure. Electronically Signed   By: Aletta Edouard M.D.   On: 09/29/2014 11:01    Assessment & Plan:   There are no diagnoses linked to this encounter. I am having Ms. Roelle maintain her amphetamine-dextroamphetamine, loratadine, butalbital-acetaminophen-caffeine, busPIRone, Eluxadoline, SUMAtriptan, diclofenac, and estrogens (conjugated).  No orders of the defined types were placed in this encounter.    Follow-up: No Follow-up on file.  Walker Kehr, MD

## 2015-08-23 NOTE — Progress Notes (Signed)
Pre visit review using our clinic review tool, if applicable. No additional management support is needed unless otherwise documented below in the visit note. 

## 2015-08-23 NOTE — Assessment & Plan Note (Signed)
Better on Imitrex prn

## 2015-08-23 NOTE — Assessment & Plan Note (Signed)
L breast w/3-4 oval lumps <1 cm at 3, 11, 1 o'clock R breast w/fibrocystic cghamges  Diag mammo

## 2015-08-23 NOTE — Assessment & Plan Note (Signed)
2017 smoker 1.5 ppd CXR

## 2015-08-29 ENCOUNTER — Telehealth: Payer: Self-pay

## 2015-08-29 DIAGNOSIS — N63 Unspecified lump in unspecified breast: Secondary | ICD-10-CM

## 2015-08-29 NOTE — Telephone Encounter (Signed)
The [facility] center called and needs some orders updated and changed.  It needs to be: IMG [id number] & IMG [id number]  Thank you, No need for a call back.

## 2015-08-30 NOTE — Telephone Encounter (Signed)
Ok. Pls change Thx

## 2015-08-30 NOTE — Telephone Encounter (Signed)
Orders updated in [employer]. 

## 2015-09-06 ENCOUNTER — Other Ambulatory Visit: Payer: Self-pay | Admitting: Internal Medicine

## 2015-09-06 ENCOUNTER — Ambulatory Visit
Admission: RE | Admit: 2015-09-06 | Discharge: 2015-09-06 | Disposition: A | Discharge status: Home / Self Care | Payer: Managed Care, Other (non HMO) | Source: Ambulatory Visit | Attending: Internal Medicine | Admitting: Internal Medicine

## 2015-09-06 DIAGNOSIS — N63 Unspecified lump in unspecified breast: Secondary | ICD-10-CM

## 2015-09-13 ENCOUNTER — Telehealth: Payer: Self-pay | Admitting: *Deleted

## 2015-09-13 NOTE — Telephone Encounter (Signed)
Called pt to discuss [facility] for 8/30. Pt relate she would like to have sx at [facility] with Dr. Nadara Mustard - McNatt. Informed pt she will not need to come to [facility] on 8/30. Pt relate she does wish to come to [facility] for treatment after her sx. Informed pt Dr. Elisha Headland would refer her back to [place]. Received verbal understanding.

## 2015-09-19 ENCOUNTER — Other Ambulatory Visit: Payer: Self-pay | Admitting: Internal Medicine

## 2015-09-20 DIAGNOSIS — C50912 Malignant neoplasm of unspecified site of left female breast: Secondary | ICD-10-CM | POA: Insufficient documentation

## 2015-10-05 DIAGNOSIS — T451X5A Adverse effect of antineoplastic and immunosuppressive drugs, initial encounter: Secondary | ICD-10-CM | POA: Insufficient documentation

## 2015-11-08 ENCOUNTER — Telehealth: Payer: Self-pay | Admitting: *Deleted

## 2015-11-08 NOTE — Telephone Encounter (Signed)
Error

## 2015-11-24 ENCOUNTER — Ambulatory Visit: Payer: Managed Care, Other (non HMO) | Admitting: Internal Medicine

## 2015-11-29 ENCOUNTER — Ambulatory Visit: Payer: Self-pay | Admitting: Obstetrics

## 2015-12-25 ENCOUNTER — Encounter (HOSPITAL_COMMUNITY): Payer: Self-pay | Admitting: Emergency Medicine

## 2015-12-25 ENCOUNTER — Inpatient Hospital Stay (HOSPITAL_COMMUNITY)
Admission: EM | Admit: 2015-12-25 | Discharge: 2015-12-28 | DRG: 603 | Disposition: A | Discharge status: Home / Self Care | Payer: Managed Care, Other (non HMO) | Attending: Internal Medicine | Admitting: Internal Medicine

## 2015-12-25 ENCOUNTER — Other Ambulatory Visit: Payer: Self-pay | Admitting: Obstetrics

## 2015-12-25 DIAGNOSIS — C50919 Malignant neoplasm of unspecified site of unspecified female breast: Secondary | ICD-10-CM | POA: Diagnosis not present

## 2015-12-25 DIAGNOSIS — Z79899 Other long term (current) drug therapy: Secondary | ICD-10-CM | POA: Diagnosis not present

## 2015-12-25 DIAGNOSIS — Z807 Family history of other malignant neoplasms of lymphoid, hematopoietic and related tissues: Secondary | ICD-10-CM

## 2015-12-25 DIAGNOSIS — L02415 Cutaneous abscess of right lower limb: Secondary | ICD-10-CM | POA: Diagnosis present

## 2015-12-25 DIAGNOSIS — Z22322 Carrier or suspected carrier of Methicillin resistant Staphylococcus aureus: Secondary | ICD-10-CM | POA: Diagnosis not present

## 2015-12-25 DIAGNOSIS — L03115 Cellulitis of right lower limb: Secondary | ICD-10-CM | POA: Diagnosis not present

## 2015-12-25 DIAGNOSIS — T451X5A Adverse effect of antineoplastic and immunosuppressive drugs, initial encounter: Secondary | ICD-10-CM | POA: Diagnosis present

## 2015-12-25 DIAGNOSIS — Z720 Tobacco use: Secondary | ICD-10-CM | POA: Diagnosis present

## 2015-12-25 DIAGNOSIS — Z87892 Personal history of anaphylaxis: Secondary | ICD-10-CM

## 2015-12-25 DIAGNOSIS — D6481 Anemia due to antineoplastic chemotherapy: Secondary | ICD-10-CM | POA: Diagnosis present

## 2015-12-25 DIAGNOSIS — L0390 Cellulitis, unspecified: Secondary | ICD-10-CM | POA: Diagnosis present

## 2015-12-25 DIAGNOSIS — Y92099 Unspecified place in other non-institutional residence as the place of occurrence of the external cause: Secondary | ICD-10-CM | POA: Diagnosis not present

## 2015-12-25 DIAGNOSIS — Z881 Allergy status to other antibiotic agents status: Secondary | ICD-10-CM

## 2015-12-25 DIAGNOSIS — B958 Unspecified staphylococcus as the cause of diseases classified elsewhere: Secondary | ICD-10-CM | POA: Diagnosis present

## 2015-12-25 DIAGNOSIS — L271 Localized skin eruption due to drugs and medicaments taken internally: Secondary | ICD-10-CM | POA: Diagnosis present

## 2015-12-25 DIAGNOSIS — Z885 Allergy status to narcotic agent status: Secondary | ICD-10-CM

## 2015-12-25 DIAGNOSIS — C50912 Malignant neoplasm of unspecified site of left female breast: Secondary | ICD-10-CM | POA: Diagnosis present

## 2015-12-25 DIAGNOSIS — E876 Hypokalemia: Secondary | ICD-10-CM | POA: Diagnosis present

## 2015-12-25 DIAGNOSIS — L089 Local infection of the skin and subcutaneous tissue, unspecified: Secondary | ICD-10-CM

## 2015-12-25 DIAGNOSIS — F17210 Nicotine dependence, cigarettes, uncomplicated: Secondary | ICD-10-CM | POA: Diagnosis present

## 2015-12-25 DIAGNOSIS — Z880 Allergy status to penicillin: Secondary | ICD-10-CM

## 2015-12-25 DIAGNOSIS — T368X5A Adverse effect of other systemic antibiotics, initial encounter: Secondary | ICD-10-CM | POA: Diagnosis present

## 2015-12-25 DIAGNOSIS — D6959 Other secondary thrombocytopenia: Secondary | ICD-10-CM | POA: Diagnosis present

## 2015-12-25 DIAGNOSIS — F17200 Nicotine dependence, unspecified, uncomplicated: Secondary | ICD-10-CM | POA: Diagnosis present

## 2015-12-25 DIAGNOSIS — E860 Dehydration: Secondary | ICD-10-CM | POA: Diagnosis present

## 2015-12-25 DIAGNOSIS — D630 Anemia in neoplastic disease: Secondary | ICD-10-CM | POA: Diagnosis present

## 2015-12-25 DIAGNOSIS — Z716 Tobacco abuse counseling: Secondary | ICD-10-CM | POA: Diagnosis not present

## 2015-12-25 DIAGNOSIS — Z800 Family history of malignant neoplasm of digestive organs: Secondary | ICD-10-CM

## 2015-12-25 DIAGNOSIS — R21 Rash and other nonspecific skin eruption: Secondary | ICD-10-CM | POA: Diagnosis present

## 2015-12-25 DIAGNOSIS — Z882 Allergy status to sulfonamides status: Secondary | ICD-10-CM | POA: Diagnosis not present

## 2015-12-25 HISTORY — DX: Malignant (primary) neoplasm, unspecified: C80.1

## 2015-12-25 LAB — CBC WITH DIFFERENTIAL/PLATELET
BASOS PCT: 0 %
Basophils Absolute: 0 10*3/uL (ref 0.0–0.1)
EOS ABS: 0 10*3/uL (ref 0.0–0.7)
EOS PCT: 0 %
HCT: 19.7 % — ABNORMAL LOW (ref 36.0–46.0)
Hemoglobin: 6.6 g/dL — CL (ref 12.0–15.0)
LYMPHS ABS: 2.8 10*3/uL (ref 0.7–4.0)
Lymphocytes Relative: 18 %
MCH: 34 pg (ref 26.0–34.0)
MCHC: 33.5 g/dL (ref 30.0–36.0)
MCV: 101.5 fL — AB (ref 78.0–100.0)
MONO ABS: 0.9 10*3/uL (ref 0.1–1.0)
Monocytes Relative: 6 %
NEUTROS ABS: 11.8 10*3/uL — AB (ref 1.7–7.7)
NEUTROS PCT: 76 %
PLATELETS: 102 10*3/uL — AB (ref 150–400)
RBC: 1.94 MIL/uL — ABNORMAL LOW (ref 3.87–5.11)
RDW: 17.1 % — ABNORMAL HIGH (ref 11.5–15.5)
WBC: 15.5 10*3/uL — ABNORMAL HIGH (ref 4.0–10.5)

## 2015-12-25 LAB — CBC
HEMATOCRIT: 27.3 % — AB (ref 36.0–46.0)
Hemoglobin: 9.1 g/dL — ABNORMAL LOW (ref 12.0–15.0)
MCH: 33.8 pg (ref 26.0–34.0)
MCHC: 33.3 g/dL (ref 30.0–36.0)
MCV: 101.5 fL — ABNORMAL HIGH (ref 78.0–100.0)
Platelets: 87 10*3/uL — ABNORMAL LOW (ref 150–400)
RBC: 2.69 MIL/uL — ABNORMAL LOW (ref 3.87–5.11)
RDW: 17.1 % — AB (ref 11.5–15.5)
WBC: 11.9 10*3/uL — ABNORMAL HIGH (ref 4.0–10.5)

## 2015-12-25 LAB — SAMPLE TO BLOOD BANK

## 2015-12-25 LAB — MAGNESIUM: Magnesium: 1.8 mg/dL (ref 1.7–2.4)

## 2015-12-25 LAB — PHOSPHORUS: Phosphorus: 3 mg/dL (ref 2.5–4.6)

## 2015-12-25 MED ORDER — SODIUM CHLORIDE 0.9 % IV BOLUS (SEPSIS)
500.0000 mL | Freq: Once | INTRAVENOUS | Status: AC
  Administered 2015-12-25: 500 mL via INTRAVENOUS

## 2015-12-25 MED ORDER — POTASSIUM CHLORIDE CRYS ER 20 MEQ PO TBCR
20.0000 meq | EXTENDED_RELEASE_TABLET | Freq: Once | ORAL | Status: AC
  Administered 2015-12-25: 20 meq via ORAL
  Filled 2015-12-25: qty 1

## 2015-12-25 MED ORDER — BUSPIRONE HCL 10 MG PO TABS
15.0000 mg | ORAL_TABLET | Freq: Two times a day (BID) | ORAL | Status: DC
  Administered 2015-12-25 – 2015-12-28 (×6): 15 mg via ORAL
  Filled 2015-12-25: qty 3
  Filled 2015-12-25: qty 1.5
  Filled 2015-12-25: qty 3
  Filled 2015-12-25 (×2): qty 1.5
  Filled 2015-12-25 (×2): qty 3
  Filled 2015-12-25 (×3): qty 1.5
  Filled 2015-12-25: qty 3

## 2015-12-25 MED ORDER — SODIUM CHLORIDE 0.9 % IV SOLN
Freq: Once | INTRAVENOUS | Status: AC
Start: 2015-12-25 — End: 2015-12-25
  Administered 2015-12-25: via INTRAVENOUS
  Filled 2015-12-25: qty 1000

## 2015-12-25 MED ORDER — VANCOMYCIN HCL 10 G IV SOLR
1500.0000 mg | INTRAVENOUS | Status: AC
  Administered 2015-12-25: 1500 mg via INTRAVENOUS
  Filled 2015-12-25: qty 1500

## 2015-12-25 MED ORDER — SODIUM CHLORIDE 0.9% FLUSH
3.0000 mL | Freq: Two times a day (BID) | INTRAVENOUS | Status: DC
  Administered 2015-12-26: 3 mL via INTRAVENOUS

## 2015-12-25 MED ORDER — VANCOMYCIN HCL 10 G IV SOLR
1500.0000 mg | INTRAVENOUS | Status: DC
  Administered 2015-12-26 – 2015-12-27 (×2): 1500 mg via INTRAVENOUS
  Filled 2015-12-25 (×3): qty 1500

## 2015-12-25 MED ORDER — ENOXAPARIN SODIUM 40 MG/0.4ML ~~LOC~~ SOLN
40.0000 mg | SUBCUTANEOUS | Status: DC
Start: 2015-12-26 — End: 2015-12-27
  Administered 2015-12-26: 40 mg via SUBCUTANEOUS
  Filled 2015-12-25: qty 0.4

## 2015-12-25 MED ORDER — LORATADINE 10 MG PO TABS
10.0000 mg | ORAL_TABLET | Freq: Every day | ORAL | Status: DC
  Administered 2015-12-26 – 2015-12-28 (×3): 10 mg via ORAL
  Filled 2015-12-25 (×3): qty 1

## 2015-12-25 MED ORDER — ONDANSETRON HCL 4 MG PO TABS: 4.0000 mg | ORAL_TABLET | Freq: Four times a day (QID) | ORAL | Status: DC | PRN

## 2015-12-25 MED ORDER — ONDANSETRON HCL 4 MG/2ML IJ SOLN
4.0000 mg | Freq: Four times a day (QID) | INTRAMUSCULAR | Status: DC | PRN
  Administered 2015-12-26 (×2): 4 mg via INTRAVENOUS
  Filled 2015-12-25 (×3): qty 2

## 2015-12-25 MED ORDER — ACETAMINOPHEN 325 MG PO TABS: 650.0000 mg | ORAL_TABLET | Freq: Four times a day (QID) | ORAL | Status: DC | PRN

## 2015-12-25 MED ORDER — AMPHETAMINE-DEXTROAMPHETAMINE 20 MG PO TABS
20.0000 mg | ORAL_TABLET | ORAL | Status: DC
  Administered 2015-12-26 – 2015-12-28 (×4): 20 mg via ORAL
  Filled 2015-12-25 (×5): qty 1

## 2015-12-25 MED ORDER — PANTOPRAZOLE SODIUM 40 MG PO TBEC
40.0000 mg | DELAYED_RELEASE_TABLET | Freq: Every day | ORAL | Status: DC
  Administered 2015-12-26 – 2015-12-28 (×3): 40 mg via ORAL
  Filled 2015-12-25 (×3): qty 1

## 2015-12-25 MED ORDER — ACETAMINOPHEN 650 MG RE SUPP: 650.0000 mg | Freq: Four times a day (QID) | RECTAL | Status: DC | PRN

## 2015-12-25 MED ORDER — POLYETHYLENE GLYCOL 3350 17 G PO PACK: 17.0000 g | PACK | Freq: Every day | ORAL | Status: DC | PRN

## 2015-12-25 MED ORDER — VANCOMYCIN HCL IN DEXTROSE 1-5 GM/200ML-% IV SOLN: 1000.0000 mg | Freq: Once | INTRAVENOUS | Status: DC

## 2015-12-25 NOTE — ED Provider Notes (Signed)
[facility] DEPT Provider Note   CSN: [id number] Arrival date & time: 12/25/15  1344     History   Chief Complaint Chief Complaint  Patient presents with  . Recurrent Skin Infections  . cancer patient    HPI Shelly Green is a 55 y.o. female.  HPI Patient presents after being sent in by her hematologist. 4 days ago she began being treated for infection on her right lower leg. States she gets itchy when she is on chemotherapy. She is on chemotherapy breast cancer and her last treatment was November 20. Seen in the office and started on clindamycin on Thursday. White count was elevated at time but presumably either elevated because of the infection or the Neulasta she had gotten. She's had no fevers. The wound on her right lower leg initially increased around the red markings from Thursday but by Friday it had stabilized. Her foot had been swollen and red and painful. States she is having difficulty walking. States yesterday she began to have some drainage out of it. States the redness is not decreased. States her pain is improved somewhat. Denies fevers. States she's now having nausea and a rash on her upper extremities and back. States this rash. Her chemotherapy. No fevers. She continues to have swelling in her right foot. Reportedly had negative blood cultures on the initial visit. She is due for her next round of chemotherapy in 1 week. The rash was on her forearms. It was somewhat itchy and red. Is improved somewhat with topical treatment.   Past Medical History:  Diagnosis Date  . Arthritis   . Cancer [facility])    breast   . History of colon polyps   . IBS (irritable bowel syndrome)   . Menopausal disorder   . Migraines   . Seasonal allergies   . Social anxiety disorder     Patient Active Problem List   Diagnosis Date Noted  . Breast lump in female 08/23/2015  . Cough 08/23/2015  . Migraine headache with aura 04/05/2015  . IBS (irritable bowel syndrome) 11/15/2014  .  Acute blood loss anemia 09/30/2014  . Spleen laceration 09/28/2014  . Splenic laceration   . Low libido 11/12/2013  . Abdominal pain, unspecified site 09/24/2013  . Chronic LBP 09/24/2013  . Tobacco use disorder 09/24/2013  . Social anxiety disorder   . Menopause syndrome 09/28/2012    Past Surgical History:  Procedure Laterality Date  . CARPAL TUNNEL RELEASE  08/20/2011   Procedure: CARPAL TUNNEL RELEASE;  Surgeon: Cammie Sickle., MD;  Location: [facility];  Service: Orthopedics;  Laterality: Right;  . HEMORROIDECTOMY    . LAPAROSCOPIC CHOLECYSTECTOMY  2007  . TOTAL ABDOMINAL HYSTERECTOMY      OB History    Gravida Para Term Preterm AB Living   4 3 3   1 3    SAB TAB Ectopic Multiple Live Births   1       3       Home Medications    Prior to Admission medications   Medication Sig Start Date End Date Taking? Authorizing Provider  amphetamine-dextroamphetamine (ADDERALL) 20 MG tablet Take 20 mg by mouth 2 (two) times daily.  09/13/14  Yes Historical Provider, MD  busPIRone (BUSPAR) 15 MG tablet Take 1 tablet (15 mg total) by mouth 2 (two) times daily. 11/15/14  Yes Evie Lacks Plotnikov, MD  butalbital-acetaminophen-caffeine (FIORICET, ESGIC) 50-325-40 MG per tablet TAKE 2 TABLETS BY MOUTH EVERY 6 HOURS AS NEEDED 10/09/14  Yes Shelly Bombard, MD  cholecalciferol (VITAMIN D) 1000 units tablet Take 1 tablet (1,000 Units total) by mouth daily. 08/23/15 08/22/16 Yes Evie Lacks Plotnikov, MD  clindamycin (CLEOCIN) 300 MG capsule Take 300 mg by mouth 4 (four) times daily. Started 11/30 x 14 days 12/21/15 01/04/16 Yes Historical Provider, MD  clonazePAM (KLONOPIN) 0.5 MG tablet Take 0.25-0.5 mg by mouth 2 (two) times daily. 11/05/15  Yes Historical Provider, MD  dexamethasone (DECADRON) 2 MG tablet Take 10 mg by mouth once. Take 10mg  by mouth on the day BEFORE chemo 10/05/15  Yes Historical Provider, MD  loratadine (CLARITIN) 10 MG tablet Take 10 mg by mouth daily.   Yes  Historical Provider, MD  Multiple Vitamin (MULTIVITAMIN WITH MINERALS) TABS tablet Take 1 tablet by mouth 2 (two) times daily.   Yes Historical Provider, MD  prochlorperazine (COMPAZINE) 10 MG tablet Take 10 mg by mouth every 6 (six) hours as needed for nausea. 11/08/15  Yes Historical Provider, MD  SUMAtriptan (IMITREX) 100 MG tablet TAKE 1 TABLET BY MOUTH EVERY DAY AS NEEDED FOR MIGRAINE. MAY REPEAT IN 2 HOURS IF HEADACHE PERSISTS 09/19/15  Yes Cassandria Anger, MD  diclofenac (VOLTAREN) 75 MG EC tablet Take 1 tablet (75 mg total) by mouth 2 (two) times daily as needed (headache). Patient not taking: Reported on 12/25/2015 04/05/15   Evie Lacks Plotnikov, MD  estrogens, conjugated, (PREMARIN) 0.625 MG tablet Take 1 tablet (0.625 mg total) by mouth daily. Take 1 tablet po daily. Patient not taking: Reported on 12/25/2015 06/20/15   Shelly Bombard, MD    Family History Family History  Problem Relation Age of Onset  . Arthritis Mother   . Lymphoma Mother   . Colon cancer Sister 15  . Mental illness Daughter   . Mental illness Maternal Aunt   . Arthritis Maternal Grandmother   . Hypertension Maternal Grandfather     Social History Social History  Substance Use Topics  . Smoking status: Current Every Day Smoker    Packs/day: 1.50    Years: 35.00    Types: Cigarettes    Last attempt to quit: 06/21/2013  . Smokeless tobacco: Never Used  . Alcohol use No     Allergies   Codeine; Hydrocodone; Penicillins; Sulfa antibiotics; and Tramadol   Review of Systems Review of Systems  Constitutional: Positive for appetite change.  HENT: Negative for congestion.   Eyes: Negative for visual disturbance.  Respiratory: Negative for chest tightness.   Cardiovascular: Negative for chest pain.  Gastrointestinal: Positive for nausea. Negative for vomiting.  Genitourinary: Negative for dysuria.  Musculoskeletal: Negative for back pain.  Skin: Positive for rash.  Neurological: Negative for  numbness.  Psychiatric/Behavioral: Negative for confusion.     Physical Exam Updated Vital Signs BP 115/66   Pulse 83   Temp 98.3 F (36.8 C) (Oral)   Resp 18   Ht 5\' 2"  (1.575 m)   Wt 128 lb (58.1 kg)   SpO2 99%   BMI 23.41 kg/m   Physical Exam  Constitutional: She appears well-developed.  Patient has lost her hair  HENT:  Head: Atraumatic.  Eyes: Pupils are equal, round, and reactive to light.  Neck: Neck supple.  Cardiovascular:  Mild tachycardia  Pulmonary/Chest: Effort normal.  Abdominal: Soft.  Musculoskeletal:  Tender indurated wound to right lower leg. Proceeds around 1 cm past the demarcated area. There is a central 5 mm ulcer in the middle that has some slight serosanguineous purulent drainage. Patient states she is  draining a lot of fluid out of it already. There is some edema of the foot. Strong dorsalis pedis pulse.Marland Kitchen  Neurological: She is alert.  Skin: Skin is warm. Capillary refill takes less than 2 seconds.  Psychiatric: She has a normal mood and affect.       ED Treatments / Results  Labs (all labs ordered are listed, but only abnormal results are displayed) Labs Reviewed  CBC WITH DIFFERENTIAL/PLATELET - Abnormal; Notable for the following:       Result Value   WBC 15.5 (*)    RBC 1.94 (*)    Hemoglobin 6.6 (*)    HCT 19.7 (*)    MCV 101.5 (*)    RDW 17.1 (*)    Platelets 102 (*)    Neutro Abs 11.8 (*)    All other components within normal limits  COMPREHENSIVE METABOLIC PANEL - Abnormal; Notable for the following:    Potassium 2.3 (*)    Chloride 99 (*)    Glucose, Bld 119 (*)    Calcium 8.6 (*)    All other components within normal limits  CBC - Abnormal; Notable for the following:    WBC 11.9 (*)    RBC 2.69 (*)    Hemoglobin 9.1 (*)    HCT 27.3 (*)    MCV 101.5 (*)    RDW 17.1 (*)    Platelets 87 (*)    All other components within normal limits  SAMPLE TO BLOOD BANK    EKG  EKG Interpretation None       Radiology No  results found.  Procedures Procedures (including critical care time)  Medications Ordered in ED Medications  sodium chloride 0.9 % bolus 500 mL (500 mLs Intravenous New Bag/Given 12/25/15 1657)  vancomycin (VANCOCIN) 1,500 mg in sodium chloride 0.9 % 500 mL IVPB (not administered)  vancomycin (VANCOCIN) 1,500 mg in sodium chloride 0.9 % 500 mL IVPB (not administered)     Initial Impression / Assessment and Plan / ED Course  I have reviewed the triage vital signs and the nursing notes.  Pertinent labs & imaging results that were available during my care of the patient were reviewed by me and considered in my medical decision making (see chart for details).  Clinical Course     Patient with breast cancer. Chemotherapy scheduled for 1 week and last chemotherapy was around 2 weeks ago. Has cellulitis/abscess of right lower leg. Has been draining spontaneously. The redness itself has not improved with the clindamycin although the wound is now draining. The foot is less swollen and erythematous and the wound is less painful, however she now has had nausea and developed a rash on her upper extremities. White count is elevated. Also has a mild anemia and states that this is where it normally has been with her chemotherapy. Initial hemoglobin was erroneously low. Also mild hypokalemia. With the wound not really decreasing in size and new symptoms will admit for IV antibiotics. Likely just an observation. Will admit to internal medicine. Bedside ultrasound showed no large fluid collection in the wound.   Final Clinical Impressions(s) / ED Diagnoses   Final diagnoses:  Skin infection  Hypokalemia    New Prescriptions New Prescriptions   No medications on file     Davonna Belling, MD 12/25/15 1720

## 2015-12-25 NOTE — Progress Notes (Signed)
Pharmacy Antibiotic Note  Shelly Green is a 55 y.o. female with PMH of breast cancer with last treatment on 12/11/15 admitted on 12/25/2015 with worsening right lower leg infection/cellulitis. Patient seen at hematologist's office on 12/21/15 and was prescribed course of Clindamycin. However, patient states she is having drainage out of R lower leg, continued swelling in right foot, nausea, and rash on upper extremities and back, so presented to [facility] ED for further evaluation. Pharmacy has been consulted for Vancomycin dosing.  Plan: Vancomycin 1500mg  IV q24h. Plan for Vancomycin trough level at steady state. Monitor renal function, cultures, clinical course.  Height: 5\' 2"  (157.5 cm) Weight: 128 lb (58.1 kg) IBW/kg (Calculated) : 50.1  Temp (24hrs), Avg:98.3 F (36.8 C), Min:98.3 F (36.8 C), Max:98.3 F (36.8 C)   Recent Labs Lab 12/25/15 1516  WBC 15.5*    CrCl cannot be calculated (Patient's most recent lab result is older than the maximum 21 days allowed.).    Allergies  Allergen Reactions  . Codeine Anaphylaxis  . Hydrocodone Shortness Of Breath  . Penicillins Anaphylaxis  . Sulfa Antibiotics Anaphylaxis and Rash  . Tramadol Shortness Of Breath    Antimicrobials this admission: PTA Clindamycin 12/4 >> Vancomycin >>  Dose adjustments this admission: --  Microbiology results: None ordered  Thank you for allowing pharmacy to be a part of this patient's care.   Lindell Spar, PharmD, BCPS Pager: [phone] 12/25/2015 4:02 PM

## 2015-12-25 NOTE — ED Triage Notes (Signed)
Pt from home with c/o of skin infection on lower right leg. Pt states she has been seen at [facility] for this last Thursday 11/30. Pt states she she has been taking antibiotics (clindamyacin) since then. Pt states that last night she began having a rash on her arms and nausea. Pt was instructed to follow up here. Pt has no SOB.  Pt is a cancer patient. Pt's last chemo was on 11/20

## 2015-12-25 NOTE — ED Notes (Signed)
Called floor to give report 20 min timer started

## 2015-12-25 NOTE — H&P (Signed)
JARYN CAUGHELL YQI:[id number] DOB: 04-14-1960 DOA: 12/25/2015     PCP: [name], MD   Outpatient Specialists: Meribeth Mattes  Patient coming from:  home Lives   With family    Chief Complaint: right leg swelling and redness  HPI: Shelly Green is a 55 y.o. female with medical history significant of Breast cancer    Presented with right leg wound was seen in the [facility] clinic 6 days ago for additional IV fluid support. While in the treatment pod, she recalls scratching her right lower leg. The following day, she noticed the area became itchy and red. She began applying alcohol, hydrogen peroxide and neosporin, then covered it with a gauze bandage. Yesterday, the erythema grew larger and she developed pain in the area. She had low grade fever.  She has not taken any Tylenol, ibuprofen or other medications to reduce a fever.  She denies purulent drainage or bleeding. She was started on clidamyacin. Next few days she developed a new rash on her arms and back that was very itchy. The swelling on her leg increased rapidly.    2 weeks ago she was seen by Oncology and  needed IVF replacement of potassium and Magnesium. For the past 2 week she  reports an itchy, red rash on her face, neck, and chest. She is applying moisturizer to her skin and this seems to help  She has nausea for which she takes Compazine as needed She has diarrhea for which she is taking Imodium as needed. Next chemotherapy is in 1 week. And she began to have nausea although she has been off her chemo this week.   Regarding pertinent Chronic problems: regarding breast cancer Stage II [id number] multifocal left breast cancer followed at [facility] She is receiving neoadjuvant chemotherapy with TCH-P.   Bedside US showed no drain able fluid   IN ER:  Temp (24hrs), Avg:98.3 F (36.8 C), Min:98.3 F (36.8 C), Max:98.3 F (36.8 C) RR 18 , HR 83 BP 115/66 WBC 11.9 Hg 9.1 Plt 87  K 2.3 Cr 0.74  Following  Medications were ordered in ER: Medications  vancomycin (VANCOCIN) 1,500 mg in sodium chloride 0.9 % 500 mL IVPB (1,500 mg Intravenous New Bag/Given 12/25/15 1808)  vancomycin (VANCOCIN) 1,500 mg in sodium chloride 0.9 % 500 mL IVPB (not administered)  sodium chloride 0.9 % bolus 500 mL (500 mLs Intravenous New Bag/Given 12/25/15 1657)      Hospitalist was called for admission for Right leg absess  Review of Systems:    Pertinent positives include: low grade  Fevers, chills, fatigue,   Constitutional:  No weight loss, night sweats,weight loss  HEENT:  No headaches, Difficulty swallowing,Tooth/dental problems,Sore throat,  No sneezing, itching, ear ache, nasal congestion, post nasal drip,  Cardio-vascular:  No chest pain, Orthopnea, PND, anasarca, dizziness, palpitations.no Bilateral lower extremity swelling  GI:  No heartburn, indigestion, abdominal pain, nausea, vomiting, diarrhea, change in bowel habits, loss of appetite, melena, blood in stool, hematemesis Resp:  no shortness of breath at rest. No dyspnea on exertion, No excess mucus, no productive cough, No non-productive cough, No coughing up of blood.No change in color of mucus.No wheezing. Skin:  no rash or lesions. No jaundice GU:  no dysuria, change in color of urine, no urgency or frequency. No straining to urinate.  No flank pain.  Musculoskeletal:  No joint pain or no joint swelling. No decreased range of motion. No back pain.  Psych:  No change in mood or  affect. No depression or anxiety. No memory loss.  Neuro: no localizing neurological complaints, no tingling, no weakness, no double vision, no gait abnormality, no slurred speech, no confusion  As per HPI otherwise 10 point review of systems negative.   Past Medical History: Past Medical History:  Diagnosis Date  . Arthritis   . Cancer [facility])    breast   . History of colon polyps   . IBS (irritable bowel syndrome)   . Menopausal disorder   . Migraines   .  Seasonal allergies   . Social anxiety disorder    Past Surgical History:  Procedure Laterality Date  . CARPAL TUNNEL RELEASE  08/20/2011   Procedure: CARPAL TUNNEL RELEASE;  Surgeon: Wyn Forster., MD;  Location: [facility];  Service: Orthopedics;  Laterality: Right;  . HEMORROIDECTOMY    . LAPAROSCOPIC CHOLECYSTECTOMY  2007  . TOTAL ABDOMINAL HYSTERECTOMY       Social History:  Ambulatory   independently      reports that she has been smoking Cigarettes.  She has a 52.50 pack-year smoking history. She has never used smokeless tobacco. She reports that she does not drink alcohol or use drugs.  Allergies:   Allergies  Allergen Reactions  . Codeine Anaphylaxis  . Hydrocodone Shortness Of Breath  . Penicillins Anaphylaxis    As a child Has patient had a PCN reaction causing immediate rash, facial/tongue/throat swelling, SOB or lightheadedness with hypotension: yes Has patient had a PCN reaction causing severe rash involving mucus membranes or skin necrosis: unknown Has patient had a PCN reaction that required hospitalization: yes Has patient had a PCN reaction occurring within the last 10 years: no If all of the above answers are "NO", then may proceed with Cephalosporin use.   . Sulfa Antibiotics Anaphylaxis and Rash  . Tramadol Shortness Of Breath       Family History:   Family History  Problem Relation Age of Onset  . Arthritis Mother   . Lymphoma Mother   . Colon cancer Sister 65  . Mental illness Daughter   . Mental illness Maternal Aunt   . Arthritis Maternal Grandmother   . Hypertension Maternal Grandfather     Medications: Prior to Admission medications   Medication Sig Start Date End Date Taking? Authorizing Provider  amphetamine-dextroamphetamine (ADDERALL) 20 MG tablet Take 20 mg by mouth 2 (two) times daily.  09/13/14  Yes Historical Provider, MD  busPIRone (BUSPAR) 15 MG tablet Take 1 tablet (15 mg total) by mouth 2 (two) times  daily. 11/15/14  Yes Georgina Quint Plotnikov, MD  butalbital-acetaminophen-caffeine (FIORICET, ESGIC) 50-325-40 MG per tablet TAKE 2 TABLETS BY MOUTH EVERY 6 HOURS AS NEEDED 10/09/14  Yes Brock Bad, MD  cholecalciferol (VITAMIN D) 1000 units tablet Take 1 tablet (1,000 Units total) by mouth daily. 08/23/15 08/22/16 Yes Georgina Quint Plotnikov, MD  clindamycin (CLEOCIN) 300 MG capsule Take 300 mg by mouth 4 (four) times daily. Started 11/30 x 14 days 12/21/15 01/04/16 Yes Historical Provider, MD  clonazePAM (KLONOPIN) 0.5 MG tablet Take 0.25-0.5 mg by mouth 2 (two) times daily. 11/05/15  Yes Historical Provider, MD  dexamethasone (DECADRON) 2 MG tablet Take 10 mg by mouth once. Take 10mg  by mouth on the day BEFORE chemo 10/05/15  Yes Historical Provider, MD  loratadine (CLARITIN) 10 MG tablet Take 10 mg by mouth daily.   Yes Historical Provider, MD  Multiple Vitamin (MULTIVITAMIN WITH MINERALS) TABS tablet Take 1 tablet by mouth 2 (two) times  daily.   Yes Historical Provider, MD  prochlorperazine (COMPAZINE) 10 MG tablet Take 10 mg by mouth every 6 (six) hours as needed for nausea. 11/08/15  Yes Historical Provider, MD  SUMAtriptan (IMITREX) 100 MG tablet TAKE 1 TABLET BY MOUTH EVERY DAY AS NEEDED FOR MIGRAINE. MAY REPEAT IN 2 HOURS IF HEADACHE PERSISTS 09/19/15  Yes Tresa Garter, MD  diclofenac (VOLTAREN) 75 MG EC tablet Take 1 tablet (75 mg total) by mouth 2 (two) times daily as needed (headache). Patient not taking: Reported on 12/25/2015 04/05/15   Georgina Quint Plotnikov, MD  estrogens, conjugated, (PREMARIN) 0.625 MG tablet Take 1 tablet (0.625 mg total) by mouth daily. Take 1 tablet po daily. Patient not taking: Reported on 12/25/2015 06/20/15   Brock Bad, MD    Physical Exam: Patient Vitals for the past 24 hrs:  BP Temp Temp src Pulse Resp SpO2 Height Weight  12/25/15 1633 115/66 - - 83 18 99 % - -  12/25/15 1407 - - - - - - 5\' 2"  (1.575 m) 58.1 kg (128 lb)  12/25/15 1357 131/73 98.3 F  (36.8 C) Oral 110 18 100 % - -    1. General:  in No Acute distress 2. Psychological: Alert and  Oriented 3. Head/ENT:     Dry Mucous Membranes                          Head Non traumatic, neck supple                           Poor Dentition 4. SKIN:   decreased Skin turgor,  Skin clean Dry right leg swelling   diffuse mildrash note on upper arms and back     5. Heart: Regular rate and rhythm no Murmur, Rub or gallop 6. Lungs:   Clear to auscultation bilaterally, no wheezes or crackles   7. Abdomen: Soft,  non-tender, Non distended 8. Lower extremities: no clubbing, cyanosis, or edema 9. Neurologically Grossly intact, moving all 4 extremities equally  10. MSK: Normal range of motion   body mass index is 23.41 kg/m.  Labs on Admission:   Labs on Admission: I have personally reviewed following labs and imaging studies  CBC:  Recent Labs Lab 12/25/15 1516 12/25/15 1615  WBC 15.5* 11.9*  NEUTROABS 11.8*  --   HGB 6.6* 9.1*  HCT 19.7* 27.3*  MCV 101.5* 101.5*  PLT 102* 87*   Basic Metabolic Panel:  Recent Labs Lab 12/25/15 1516  NA 138  K 2.3*  CL 99*  CO2 29  GLUCOSE 119*  BUN 8  CREATININE 0.74  CALCIUM 8.6*   GFR: Estimated Creatinine Clearance: 62.8 mL/min (by C-G formula based on SCr of 0.74 mg/dL). Liver Function Tests:  Recent Labs Lab 12/25/15 1516  AST 21  ALT 15  ALKPHOS 96  BILITOT 0.5  PROT 6.5  ALBUMIN 3.5   No results for input(s): LIPASE, AMYLASE in the last 168 hours. No results for input(s): AMMONIA in the last 168 hours. Coagulation Profile: No results for input(s): INR, PROTIME in the last 168 hours. Cardiac Enzymes: No results for input(s): CKTOTAL, CKMB, CKMBINDEX, TROPONINI in the last 168 hours. BNP (last 3 results) No results for input(s): PROBNP in the last 8760 hours. HbA1C: No results for input(s): HGBA1C in the last 72 hours. CBG: No results for input(s): GLUCAP in the last 168 hours. Lipid Profile: No  results  for input(s): CHOL, HDL, LDLCALC, TRIG, CHOLHDL, LDLDIRECT in the last 72 hours. Thyroid Function Tests: No results for input(s): TSH, T4TOTAL, FREET4, T3FREE, THYROIDAB in the last 72 hours. Anemia Panel: No results for input(s): VITAMINB12, FOLATE, FERRITIN, TIBC, IRON, RETICCTPCT in the last 72 hours.   @LABRCNTIP (procalcitonin:4,lacticidven:4) )No results found for this or any previous visit (from the past 240 hour(s)).     UA  not ordered  No results found for: HGBA1C  Estimated Creatinine Clearance: 62.8 mL/min (by C-G formula based on SCr of 0.74 mg/dL).  BNP (last 3 results) No results for input(s): PROBNP in the last 8760 hours.   ECG REPORT ordered  Filed Weights   12/25/15 1407  Weight: 58.1 kg (128 lb)     Cultures: No results found for: SDES, SPECREQUEST, CULT, REPTSTATUS   Radiological Exams on Admission: No results found.  Chart has been reviewed    Assessment/Plan  55 y.o. female with medical history significant of Breast cancer, History of spleen laceration, tobacco abuse been admitted for cellulitis/abscess   draining of right lower extremity  Present on Admission: . Abscess of right leg. Cellulitis - -admit per cellulitis protocol will   Wound is already draining no need for incision at this point  continue current antibiotic choice vancomycin         Will obtain MRSA screening,       obtain blood cultures if febrile or septic     further antibiotic adjustment pending above results  . Tobacco use disorder examination did not wish to be on nicotine patch consult regarding quitting . Breast cancer [facility]) we will need to follow up with oncology after discharge . Rash suspect possibly related to clindamycin will discontinue . Hypokalemia obtain EKG monitor on telemetry replace and check magnesium and phosphate level      Other plan as per orders.  DVT prophylaxis:   Lovenox     Code Status:  FULL CODE   as per patient    Family  Communication:   Family   at  Bedside  plan of care was discussed with  Husband,   Disposition Plan:   To home once workup is complete and patient is stable                                   Consults called: none  Admission status:    inpatient       Level of care     tele        I have spent a total of 57 min on this admission   Damarri Rampy 12/25/2015, 7:28 PM    [facility]  Pager [phone]   after 2 AM please page floor coverage PA If 7AM-7PM, please contact the day team taking care of the patient  [email or web address]  Password [email or web address]

## 2015-12-26 LAB — TSH: TSH: 1.147 u[IU]/mL (ref 0.350–4.500)

## 2015-12-26 LAB — COMPREHENSIVE METABOLIC PANEL
ALT: 15 U/L (ref 14–54)
ALT: 12 U/L — AB (ref 14–54)
ANION GAP: 10 (ref 5–15)
AST: 21 U/L (ref 15–41)
AST: 16 U/L (ref 15–41)
Albumin: 3.5 g/dL (ref 3.5–5.0)
Albumin: 2.9 g/dL — ABNORMAL LOW (ref 3.5–5.0)
Alkaline Phosphatase: 96 U/L (ref 38–126)
Alkaline Phosphatase: 72 U/L (ref 38–126)
Anion gap: 6 (ref 5–15)
BILIRUBIN TOTAL: 0.6 mg/dL (ref 0.3–1.2)
BUN: 8 mg/dL (ref 6–20)
BUN: 7 mg/dL (ref 6–20)
CALCIUM: 8.3 mg/dL — AB (ref 8.9–10.3)
CHLORIDE: 99 mmol/L — AB (ref 101–111)
CO2: 29 mmol/L (ref 22–32)
CO2: 28 mmol/L (ref 22–32)
CREATININE: 0.67 mg/dL (ref 0.44–1.00)
Calcium: 8.6 mg/dL — ABNORMAL LOW (ref 8.9–10.3)
Chloride: 106 mmol/L (ref 101–111)
Creatinine, Ser: 0.74 mg/dL (ref 0.44–1.00)
GFR calc Af Amer: >60 mL/min (ref >60)
GLUCOSE: 119 mg/dL — AB (ref 65–99)
Glucose, Bld: 106 mg/dL — ABNORMAL HIGH (ref 65–99)
Potassium: 2.3 mmol/L — CL (ref 3.5–5.1)
Potassium: 2.8 mmol/L — ABNORMAL LOW (ref 3.5–5.1)
Sodium: 138 mmol/L (ref 135–145)
Sodium: 140 mmol/L (ref 135–145)
TOTAL PROTEIN: 6.5 g/dL (ref 6.5–8.1)
TOTAL PROTEIN: 5.6 g/dL — AB (ref 6.5–8.1)
Total Bilirubin: 0.5 mg/dL (ref 0.3–1.2)

## 2015-12-26 LAB — CBC
HCT: 24.9 % — ABNORMAL LOW (ref 36.0–46.0)
Hemoglobin: 8.2 g/dL — ABNORMAL LOW (ref 12.0–15.0)
MCH: 33.6 pg (ref 26.0–34.0)
MCHC: 32.9 g/dL (ref 30.0–36.0)
MCV: 102 fL — ABNORMAL HIGH (ref 78.0–100.0)
PLATELETS: 72 10*3/uL — AB (ref 150–400)
RBC: 2.44 MIL/uL — ABNORMAL LOW (ref 3.87–5.11)
RDW: 17.5 % — AB (ref 11.5–15.5)
WBC: 8.4 10*3/uL (ref 4.0–10.5)

## 2015-12-26 LAB — PHOSPHORUS: Phosphorus: 3.1 mg/dL (ref 2.5–4.6)

## 2015-12-26 LAB — MAGNESIUM: Magnesium: 1.7 mg/dL (ref 1.7–2.4)

## 2015-12-26 LAB — MRSA PCR SCREENING: MRSA by PCR: NEGATIVE

## 2015-12-26 MED ORDER — CAMPHOR-MENTHOL 0.5-0.5 % EX LOTN
TOPICAL_LOTION | Freq: Four times a day (QID) | CUTANEOUS | Status: DC | PRN
  Administered 2015-12-26: 19:00:00 via TOPICAL
  Filled 2015-12-26: qty 222

## 2015-12-26 MED ORDER — DIPHENHYDRAMINE-ZINC ACETATE 2-0.1 % EX CREA
TOPICAL_CREAM | Freq: Three times a day (TID) | CUTANEOUS | Status: DC | PRN
  Administered 2015-12-26: 1 via TOPICAL
  Filled 2015-12-26: qty 28

## 2015-12-26 MED ORDER — SODIUM CHLORIDE 0.9% FLUSH
10.0000 mL | INTRAVENOUS | Status: DC | PRN
  Administered 2015-12-27 – 2015-12-28 (×3): 10 mL
  Filled 2015-12-26 (×3): qty 40

## 2015-12-26 MED ORDER — POTASSIUM CHLORIDE IN NACL 40-0.9 MEQ/L-% IV SOLN
INTRAVENOUS | Status: DC
  Administered 2015-12-26 – 2015-12-28 (×3): 50 mL/h via INTRAVENOUS
  Filled 2015-12-26 (×4): qty 1000

## 2015-12-26 MED ORDER — POTASSIUM CHLORIDE CRYS ER 20 MEQ PO TBCR
40.0000 meq | EXTENDED_RELEASE_TABLET | ORAL | Status: AC
  Administered 2015-12-26 (×3): 40 meq via ORAL
  Filled 2015-12-26 (×3): qty 2

## 2015-12-26 MED ORDER — SACCHAROMYCES BOULARDII 250 MG PO CAPS
250.0000 mg | ORAL_CAPSULE | Freq: Two times a day (BID) | ORAL | Status: DC
  Administered 2015-12-26 – 2015-12-28 (×5): 250 mg via ORAL
  Filled 2015-12-26 (×5): qty 1

## 2015-12-26 NOTE — Progress Notes (Signed)
[facility] PROGRESS NOTE  Shelly Green [id number] DOB: Jun 04, 1960 DOA: 12/25/2015 PCP: Walker Kehr, MD  Interim summary and HPI 55 y.o. female with medical history significant of Breast cancer, History of spleen laceration, tobacco abuse been admitted for cellulitis/abscess   draining of right lower extremity. Also found to be dehydrated and with severe hypokalemia.  Assessment/Plan: 1-RLE cellulitis/abscess: -improving -will continue IV antibiotics -continue supportive care and leg elevation -leg remark, to assess progression/resolution -advise to maintain leg elevated -bedside US by EDP (no fluid collection that required incision); will monitor and if she failed to improved will recommend CT scan of her leg.  2-hx of Tobacco abuse -cessation counseling provided -patient decline nicotine patch  3-skin rash -most likely associated to medication and dry skin -resolved prior to admission with use of moisturizer lotion -cleocin was discontinued  4-hypokalemia -will monitor and replete as needed -will check her on telemetry -Mg WNL  5-dehydration: -received IVF's resuscitation overnight -will now change IVF's to maintenance   6-hx of breast cancer: actively on chemotherapy -continue outpatient follow up and treatment at [place] [facility] Status: Full Family Communication: best friend at bedside  Disposition Plan: will continue IV antibiotics and supportive care. Will replete electrolytes and encourage PO intake.   Consultants:  None   Procedures:  See below for x-ray reports   Antibiotics:  Vancomycin 12/4  HPI/Subjective: Afebrile, denies CP and SOB. Overall feeling better. RLE with improvement in swelling and less redness/pain according to patient   Objective: Vitals:   12/26/15 0956 12/26/15 1504  BP: 121/68 116/60  Pulse: 88 91  Resp: 18 18  Temp: 98.4 F (36.9 C) 98.6 F (37 C)    Intake/Output Summary (Last 24 hours)  at 12/26/15 1655 Last data filed at 12/26/15 1500  Gross per 24 hour  Intake           141.67 ml  Output                0 ml  Net           141.67 ml   Filed Weights   12/25/15 1407 12/25/15 2211  Weight: 58.1 kg (128 lb) 59 kg (130 lb 1.1 oz)    Exam:   General:  Afebrile, in no distress. Reports some nausea and 1 episode of loose stools. No vomiting. RLE still with erythematous changes and mild pain; per patient improved swelling and looking less angry.  Cardiovascular: S1 and S2, no rubs or gallops  Respiratory: CTA bilaterally   Abdomen: soft, NT, ND, positive BS  Musculoskeletal: RLE with erythema, 1++ edema and warm sensation. Mild tenderness with palpation and movement in affected area. LLE w/o abnormalities.  Skin: slight spontaneous drainage out of erythematous/swollen area in her right medial aspect of her leg. No other rash or open wound appreciated.     Data Reviewed: Basic Metabolic Panel:  Recent Labs Lab 12/25/15 1516 12/25/15 1520 12/26/15 0418  NA 138  --  140  K 2.3*  --  2.8*  CL 99*  --  106  CO2 29  --  28  GLUCOSE 119*  --  106*  BUN 8  --  7  CREATININE 0.74  --  0.67  CALCIUM 8.6*  --  8.3*  MG  --  1.8 1.7  PHOS  --  3.0 3.1   Liver Function Tests:  Recent Labs Lab 12/25/15 1516 12/26/15 0418  AST 21 16  ALT 15 12*  ALKPHOS 96 72  BILITOT 0.5 0.6  PROT 6.5 5.6*  ALBUMIN 3.5 2.9*   CBC:  Recent Labs Lab 12/25/15 1516 12/25/15 1615 12/26/15 0418  WBC 15.5* 11.9* 8.4  NEUTROABS 11.8*  --   --   HGB 6.6* 9.1* 8.2*  HCT 19.7* 27.3* 24.9*  MCV 101.5* 101.5* 102.0*  PLT 102* 87* 72*   CBG: No results for input(s): GLUCAP in the last 168 hours.  Recent Results (from the past 240 hour(s))  MRSA PCR Screening     Status: None   Collection Time: 12/26/15 12:35 AM  Result Value Ref Range Status   MRSA by PCR NEGATIVE NEGATIVE Final    Comment:        The GeneXpert MRSA Assay (FDA approved for NASAL specimens only), is  one component of a comprehensive MRSA colonization surveillance program. It is not intended to diagnose MRSA infection nor to guide or monitor treatment for MRSA infections.      Studies: No results found.  Scheduled Meds: . amphetamine-dextroamphetamine  20 mg Oral 2 times per day  . busPIRone  15 mg Oral BID  . enoxaparin (LOVENOX) injection  40 mg Subcutaneous Q24H  . loratadine  10 mg Oral Daily  . pantoprazole  40 mg Oral Daily  . potassium chloride  40 mEq Oral Q4H  . saccharomyces boulardii  250 mg Oral BID  . sodium chloride flush  3 mL Intravenous Q12H  . vancomycin  1,500 mg Intravenous Q24H   Continuous Infusions: . 0.9 % NaCl with KCl 40 mEq / L 50 mL/hr (12/26/15 1434)    Time spent: 25 minutes    Barton Dubois  [facility] Pager [phone]. If 7PM-7AM, please contact night-coverage at [email or web address], password [facility] 12/26/2015, 4:55 PM  LOS: 1 day

## 2015-12-27 LAB — CBC
HEMATOCRIT: 22.7 % — AB (ref 36.0–46.0)
HEMOGLOBIN: 7.6 g/dL — AB (ref 12.0–15.0)
MCH: 33.9 pg (ref 26.0–34.0)
MCHC: 33.5 g/dL (ref 30.0–36.0)
MCV: 101.3 fL — AB (ref 78.0–100.0)
Platelets: 73 10*3/uL — ABNORMAL LOW (ref 150–400)
RBC: 2.24 MIL/uL — ABNORMAL LOW (ref 3.87–5.11)
RDW: 17.5 % — AB (ref 11.5–15.5)
WBC: 5.7 10*3/uL (ref 4.0–10.5)

## 2015-12-27 LAB — BASIC METABOLIC PANEL
Anion gap: 5 (ref 5–15)
CHLORIDE: 112 mmol/L — AB (ref 101–111)
CO2: 25 mmol/L (ref 22–32)
Calcium: 8.3 mg/dL — ABNORMAL LOW (ref 8.9–10.3)
Creatinine, Ser: 0.61 mg/dL (ref 0.44–1.00)
GFR calc Af Amer: >60 mL/min (ref >60)
GFR calc non Af Amer: >60 mL/min (ref >60)
Glucose, Bld: 98 mg/dL (ref 65–99)
POTASSIUM: 4.3 mmol/L (ref 3.5–5.1)
SODIUM: 142 mmol/L (ref 135–145)

## 2015-12-27 MED ORDER — DIPHENHYDRAMINE HCL 25 MG PO CAPS
25.0000 mg | ORAL_CAPSULE | Freq: Four times a day (QID) | ORAL | Status: DC | PRN
  Administered 2015-12-27: 25 mg via ORAL
  Filled 2015-12-27: qty 1

## 2015-12-27 NOTE — Progress Notes (Signed)
Patient Demographics:    Shelly Green, is a 55 y.o. female, DOB - [birth year], UYQ:[id number]  Admit date - 12/25/2015   Admitting Physician [name], MD  Outpatient Primary MD for the patient is [name], MD  LOS - 2   Chief Complaint  Patient presents with  . Recurrent Skin Infections  . cancer patient        Subjective:    Shelly Green today has no fevers, no emesis,  No chest pain,  Sister-in-law at bedside, right leg wound with seropurulent drainage , No bleeding concerns, questions answered  Assessment  & Plan :    Active Problems:   Tobacco use disorder   Abscess of right leg   Breast cancer (HCC)   Rash   Hypokalemia   Tobacco abuse   Cellulitis   1)Right Lower Extremity Cellulitis/Abscess-overall improved, WBC is down to 5.7 from 15.5 on admission, bedside ultrasound done in the emergency room on admission was without evidence of drainable fluid, continue IV vancomycin started on 12/25/2015.  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 No fevers, right leg wound with seropurulent drainage . Wound culture obtained on 12/27/2015, consider adjusting antibiotics when culture results are available. Consider ID consult if wound culture grows resistant organisms as patient is immunocompromised, WBC count is down to 5.7 from 15.5  2)FEN-dehydration and hypokalemia resolved with hydration and replacement  3)History of stage IIc breast cancer-  currently undergoing chemotherapy treatments at [facility]  4)Anemia and Thrombocytopenia- may be related to underlying breast cancer with ongoing chemotherapy treatments, platelet count is down to 73,000 and hemoglobin is down to 7.6, hold Lovenox and use SCDs for DVT prophylaxis instead   5)Dispo- possible discharge home in 1-2 days once preliminary Gram stain results of wound cultures available to help direct antibiotic  therapy (when culture obtained on 12/27/2015)  Consults  :  Consider ID consult if wound culture grows resistant organisms as patient is immunocompromised   DVT Prophylaxis  SCDs   Lab Results  Component Value Date   PLT 73 (L) 12/27/2015    Inpatient Medications  Scheduled Meds: . amphetamine-dextroamphetamine  20 mg Oral 2 times per day  . busPIRone  15 mg Oral BID  . enoxaparin (LOVENOX) injection  40 mg Subcutaneous Q24H  . loratadine  10 mg Oral Daily  . pantoprazole  40 mg Oral Daily  . saccharomyces boulardii  250 mg Oral BID  . sodium chloride flush  3 mL Intravenous Q12H  . vancomycin  1,500 mg Intravenous Q24H   Continuous Infusions: . 0.9 % NaCl with KCl 40 mEq / L 50 mL/hr (12/26/15 1434)   PRN Meds:.acetaminophen **OR** acetaminophen, camphor-menthol, diphenhydrAMINE-zinc acetate, ondansetron **OR** ondansetron (ZOFRAN) IV, polyethylene glycol, sodium chloride flush    Anti-infectives    Start     Dose/Rate Route Frequency Ordered Stop   12/26/15 1700  vancomycin (VANCOCIN) 1,500 mg in sodium chloride 0.9 % 500 mL IVPB     1,500 mg 250 mL/hr over 120 Minutes Intravenous Every 24 hours 12/25/15 1652     12/25/15 1700  vancomycin (VANCOCIN) 1,500 mg in sodium chloride 0.9 % 500 mL IVPB     1,500 mg 250 mL/hr over 120 Minutes Intravenous STAT 12/25/15 1651 12/25/15  2350   12/25/15 1515  vancomycin (VANCOCIN) IVPB 1000 mg/200 mL premix  Status:  Discontinued     1,000 mg 200 mL/hr over 60 Minutes Intravenous  Once 12/25/15 1506 12/25/15 1651        Objective:   Vitals:   12/26/15 0956 12/26/15 1504 12/26/15 2216 12/27/15 0524  BP: 121/68 116/60 113/66 (!) 102/52  Pulse: 88 91 100 98  Resp: 18 18 18 18   Temp: 98.4 F (36.9 C) 98.6 F (37 C) 98.9 F (37.2 C) 98.8 F (37.1 C)  TempSrc: Oral Oral Oral Oral  SpO2: 99% 98% 100% 98%  Weight:      Height:        Wt Readings from Last 3 Encounters:  12/25/15 59 kg (130 lb 1.1 oz)  08/23/15 58.1 kg (128  lb)  05/29/15 57.2 kg (126 lb)     Intake/Output Summary (Last 24 hours) at 12/27/15 0850 Last data filed at 12/27/15 0600  Gross per 24 hour  Intake          1631.67 ml  Output                0 ml  Net          1631.67 ml     Physical Exam  Gen:- Awake Alert,  In no apparent distress  HEENT:- [place].AT, No sclera icterus Neck-Supple Neck,No JVD,.  Lungs-  CTAB , right chest porta cath site is clean dry and intact CV- S1, S2 normal Abd-  +ve B.Sounds, Abd Soft, No tenderness,    Extremity/Skin:- Right Lower Extremity wound with Sero-purulent drainage, area of erythema and warmth swelling tenderness noted (has not spread beyond marked areas)   Data Review:   Micro Results Recent Results (from the past 240 hour(s))  MRSA PCR Screening     Status: None   Collection Time: 12/26/15 12:35 AM  Result Value Ref Range Status   MRSA by PCR NEGATIVE NEGATIVE Final    Comment:        The GeneXpert MRSA Assay (FDA approved for NASAL specimens only), is one component of a comprehensive MRSA colonization surveillance program. It is not intended to diagnose MRSA infection nor to guide or monitor treatment for MRSA infections.     Radiology Reports No results found.   CBC  Recent Labs Lab 12/25/15 1516 12/25/15 1615 12/26/15 0418 12/27/15 0515  WBC 15.5* 11.9* 8.4 5.7  HGB 6.6* 9.1* 8.2* 7.6*  HCT 19.7* 27.3* 24.9* 22.7*  PLT 102* 87* 72* 73*  MCV 101.5* 101.5* 102.0* 101.3*  MCH 34.0 33.8 33.6 33.9  MCHC 33.5 33.3 32.9 33.5  RDW 17.1* 17.1* 17.5* 17.5*  LYMPHSABS 2.8  --   --   --   MONOABS 0.9  --   --   --   EOSABS 0.0  --   --   --   BASOSABS 0.0  --   --   --     Chemistries   Recent Labs Lab 12/25/15 1516 12/25/15 1520 12/26/15 0418 12/27/15 0515  NA 138  --  140 142  K 2.3*  --  2.8* 4.3  CL 99*  --  106 112*  CO2 29  --  28 25  GLUCOSE 119*  --  106* 98  BUN 8  --  7 <5*  CREATININE 0.74  --  0.67 0.61  CALCIUM 8.6*  --  8.3* 8.3*  MG  --  1.8  1.7  --   AST 21  --  16  --   ALT 15  --  12*  --   ALKPHOS 96  --  72  --   BILITOT 0.5  --  0.6  --    ------------------------------------------------------------------------------------------------------------------ No results for input(s): CHOL, HDL, LDLCALC, TRIG, CHOLHDL, LDLDIRECT in the last 72 hours.  No results found for: HGBA1C ------------------------------------------------------------------------------------------------------------------  Recent Labs  12/26/15 0418  TSH 1.147   ------------------------------------------------------------------------------------------------------------------ No results for input(s): VITAMINB12, FOLATE, FERRITIN, TIBC, IRON, RETICCTPCT in the last 72 hours.  Coagulation profile No results for input(s): INR, PROTIME in the last 168 hours.  No results for input(s): DDIMER in the last 72 hours.  Cardiac Enzymes No results for input(s): CKMB, TROPONINI, MYOGLOBIN in the last 168 hours.  Invalid input(s): CK ------------------------------------------------------------------------------------------------------------------ No results found for: BNP   Aowyn Rozeboom M.D on 12/27/2015 at 8:50 AM  Between 7am to 7pm - Pager - [phone]  After 7pm go to [email or web address] - password [email or web address]  [facility] -  Office  [phone]  Dragon dictation system was used to create this note, attempts have been made to correct errors, however presence of uncorrected errors is not a reflection quality of care provided

## 2015-12-28 DIAGNOSIS — L03115 Cellulitis of right lower limb: Principal | ICD-10-CM

## 2015-12-28 DIAGNOSIS — E876 Hypokalemia: Secondary | ICD-10-CM

## 2015-12-28 LAB — BASIC METABOLIC PANEL
ANION GAP: 5 (ref 5–15)
BUN: <5 mg/dL — ABNORMAL LOW (ref 6–20)
CALCIUM: 8.5 mg/dL — AB (ref 8.9–10.3)
CO2: 27 mmol/L (ref 22–32)
CREATININE: 0.71 mg/dL (ref 0.44–1.00)
Chloride: 110 mmol/L (ref 101–111)
GFR calc non Af Amer: >60 mL/min (ref >60)
Glucose, Bld: 97 mg/dL (ref 65–99)
Potassium: 4.4 mmol/L (ref 3.5–5.1)
SODIUM: 142 mmol/L (ref 135–145)

## 2015-12-28 LAB — CBC
HEMATOCRIT: 23.3 % — AB (ref 36.0–46.0)
HEMOGLOBIN: 7.6 g/dL — AB (ref 12.0–15.0)
MCH: 34.2 pg — ABNORMAL HIGH (ref 26.0–34.0)
MCHC: 32.6 g/dL (ref 30.0–36.0)
MCV: 105 fL — ABNORMAL HIGH (ref 78.0–100.0)
Platelets: 71 10*3/uL — ABNORMAL LOW (ref 150–400)
RBC: 2.22 MIL/uL — ABNORMAL LOW (ref 3.87–5.11)
RDW: 17.8 % — AB (ref 11.5–15.5)
WBC: 5.9 10*3/uL (ref 4.0–10.5)

## 2015-12-28 MED ORDER — SACCHAROMYCES BOULARDII 250 MG PO CAPS: 250.0000 mg | ORAL_CAPSULE | Freq: Two times a day (BID) | ORAL | 0 refills | Status: DC

## 2015-12-28 MED ORDER — DIPHENHYDRAMINE HCL 25 MG PO CAPS: 25.0000 mg | ORAL_CAPSULE | Freq: Four times a day (QID) | ORAL | 0 refills | Status: DC | PRN

## 2015-12-28 MED ORDER — HEPARIN SOD (PORK) LOCK FLUSH 100 UNIT/ML IV SOLN
500.0000 [IU] | INTRAVENOUS | Status: AC | PRN
  Administered 2015-12-28: 500 [IU]

## 2015-12-28 MED ORDER — DOXYCYCLINE HYCLATE 50 MG PO CAPS: 100.0000 mg | ORAL_CAPSULE | Freq: Two times a day (BID) | ORAL | 0 refills | Status: AC

## 2015-12-28 MED ORDER — PANTOPRAZOLE SODIUM 40 MG PO TBEC: 40.0000 mg | DELAYED_RELEASE_TABLET | Freq: Every day | ORAL | 0 refills | Status: DC

## 2015-12-28 MED ORDER — DIPHENHYDRAMINE-ZINC ACETATE 2-0.1 % EX CREA: TOPICAL_CREAM | Freq: Three times a day (TID) | CUTANEOUS | 0 refills | Status: DC | PRN

## 2015-12-29 ENCOUNTER — Other Ambulatory Visit: Payer: Self-pay | Admitting: Obstetrics

## 2015-12-29 LAB — AEROBIC CULTURE  (SUPERFICIAL SPECIMEN): GRAM STAIN: NONE SEEN

## 2015-12-29 LAB — AEROBIC CULTURE W GRAM STAIN (SUPERFICIAL SPECIMEN)

## 2015-12-29 NOTE — Discharge Summary (Signed)
Physician Discharge Summary  Shelly Green [id number] DOB: January 29, 1960 DOA: 12/25/2015  PCP: Walker Kehr, MD  Admit date: 12/25/2015 Discharge date: 12/28/2015  Admitted From: Home. Disposition:  Home   Recommendations for Outpatient Follow-up:  1. Follow up with PCP in 1, 2 days. 2. Please obtain BMP/CBC in one week 3. Please follow up with oncology as recommended.    Discharge Condition:stable.  CODE STATUS:full code Diet recommendation: regular.  Brief/Interim Summary: Shelly Green is a 55 y.o. female with medical history significant of Breast cancer, Presented with right leg wound was seen in the [facility] clinic 6 days ago .  Discharge Diagnoses:  Active Problems:   Tobacco use disorder   Abscess of right leg   Breast cancer (HCC)   Rash   Hypokalemia   Tobacco abuse   Cellulitis  Right Lower Extremity Cellulitis/Abscess-overall improved, WBC is down to 5.7 from 15.5 on admission, bedside ultrasound done in the emergency room on admission was without evidence of drainable fluid, she ws started on IV vancomycin. MRSA SWAB negative. Gram stain negative. No fevers, right leg wound with seropurulent drainage . Wound culture obtained on 12/27/2015, show sensitive staph to doxycycline.   FEN-dehydration and hypokalemia resolved with hydration and replacement  History of stage IIc breast cancer-  currently undergoing chemotherapy treatments at [facility]  )Anemia and Thrombocytopenia- may be related to underlying breast cancer with ongoing chemotherapy treatments, platelet count is down to 73,000 and hemoglobin is down to 7.6, stable.   Discharge Instructions  Discharge Instructions    Discharge instructions    Complete by:  As directed    Follow up with PCP in 1 to 2 days and follow up with your oncologist as recommended on Monday.       Medication List    STOP taking these medications   clindamycin 300 MG capsule Commonly known as:  CLEOCIN    diclofenac 75 MG EC tablet Commonly known as:  VOLTAREN   estrogens (conjugated) 0.625 MG tablet Commonly known as:  PREMARIN   prochlorperazine 10 MG tablet Commonly known as:  COMPAZINE     TAKE these medications   amphetamine-dextroamphetamine 20 MG tablet Commonly known as:  ADDERALL Take 20 mg by mouth 2 (two) times daily.   busPIRone 15 MG tablet Commonly known as:  BUSPAR Take 1 tablet (15 mg total) by mouth 2 (two) times daily.   butalbital-acetaminophen-caffeine 50-325-40 MG tablet Commonly known as:  FIORICET, ESGIC TAKE 2 TABLETS BY MOUTH EVERY 6 HOURS AS NEEDED   cholecalciferol 1000 units tablet Commonly known as:  VITAMIN D Take 1 tablet (1,000 Units total) by mouth daily.   clonazePAM 0.5 MG tablet Commonly known as:  KLONOPIN Take 0.25-0.5 mg by mouth 2 (two) times daily.   dexamethasone 2 MG tablet Commonly known as:  DECADRON Take 10 mg by mouth once. Take 10mg  by mouth on the day BEFORE chemo   diphenhydrAMINE 25 mg capsule Commonly known as:  BENADRYL Take 1 capsule (25 mg total) by mouth every 6 (six) hours as needed for itching or sleep.   diphenhydrAMINE-zinc acetate cream Commonly known as:  BENADRYL Apply topically 3 (three) times daily as needed for itching.   doxycycline 50 MG capsule Commonly known as:  VIBRAMYCIN Take 2 capsules (100 mg total) by mouth 2 (two) times daily.   loratadine 10 MG tablet Commonly known as:  CLARITIN Take 10 mg by mouth daily.   multivitamin with minerals Tabs tablet Take 1 tablet by  mouth 2 (two) times daily.   pantoprazole 40 MG tablet Commonly known as:  PROTONIX Take 1 tablet (40 mg total) by mouth daily.   saccharomyces boulardii 250 MG capsule Commonly known as:  FLORASTOR Take 1 capsule (250 mg total) by mouth 2 (two) times daily.   SUMAtriptan 100 MG tablet Commonly known as:  IMITREX TAKE 1 TABLET BY MOUTH EVERY DAY AS NEEDED FOR MIGRAINE. MAY REPEAT IN 2 HOURS IF HEADACHE PERSISTS       Follow-up Information    Walker Kehr, MD Follow up on 12/29/2015.   Specialty:  Internal Medicine Why:  follow up post hospitalization, for the rash and cellulitis.  Contact information: [address] [place] [address] [phone]          Allergies  Allergen Reactions  . Codeine Anaphylaxis  . Hydrocodone Shortness Of Breath  . Penicillins Anaphylaxis    As a child Has patient had a PCN reaction causing immediate rash, facial/tongue/throat swelling, SOB or lightheadedness with hypotension: yes Has patient had a PCN reaction causing severe rash involving mucus membranes or skin necrosis: unknown Has patient had a PCN reaction that required hospitalization: yes Has patient had a PCN reaction occurring within the last 10 years: no If all of the above answers are "NO", then may proceed with Cephalosporin use.   . Sulfa Antibiotics Anaphylaxis and Rash  . Tramadol Shortness Of Breath  . Clindamycin/Lincomycin     rush    Consultations:  none   Procedures/Studies:  No results found.    Subjective: No new complaints.   Discharge Exam: Vitals:   12/27/15 2111 12/28/15 0535  BP: 120/70 120/70  Pulse: 92 87  Resp: 18 18  Temp: 98.3 F (36.8 C) 98.3 F (36.8 C)   Vitals:   12/27/15 0524 12/27/15 1353 12/27/15 2111 12/28/15 0535  BP: (!) 102/52 124/68 120/70 120/70  Pulse: 98 88 92 87  Resp: 18 20 18 18   Temp: 98.8 F (37.1 C) 99.3 F (37.4 C) 98.3 F (36.8 C) 98.3 F (36.8 C)  TempSrc: Oral Oral Oral Oral  SpO2: 98% 96% 99% 100%  Weight:      Height:        General: Pt is alert, awake, not in acute distress Cardiovascular: RRR, S1/S2 +, no rubs, no gallops Respiratory: CTA bilaterally, no wheezing, no rhonchi Abdominal: Soft, NT, ND, bowel sounds + Extremities: Right Lower Extremity wound with Sero-purulent drainage, area of erythema and warmth swelling improved.    The results of significant diagnostics from this hospitalization  (including imaging, microbiology, ancillary and laboratory) are listed below for reference.     Microbiology: Recent Results (from the past 240 hour(s))  MRSA PCR Screening     Status: None   Collection Time: 12/26/15 12:35 AM  Result Value Ref Range Status   MRSA by PCR NEGATIVE NEGATIVE Final    Comment:        The GeneXpert MRSA Assay (FDA approved for NASAL specimens only), is one component of a comprehensive MRSA colonization surveillance program. It is not intended to diagnose MRSA infection nor to guide or monitor treatment for MRSA infections.   Aerobic Culture (superficial specimen)     Status: None (Preliminary result)   Collection Time: 12/27/15 12:50 PM  Result Value Ref Range Status   Specimen Description ABSCESS  Final   Special Requests Immunocompromised  Final   Gram Stain NO WBC SEEN NO ORGANISMS SEEN   Final   Culture   Final  CULTURE REINCUBATED FOR BETTER GROWTH Performed at [facility]    Report Status PENDING  Incomplete     Labs: BNP (last 3 results) No results for input(s): BNP in the last 8760 hours. Basic Metabolic Panel:  Recent Labs Lab 12/25/15 1516 12/25/15 1520 12/26/15 0418 12/27/15 0515 12/28/15 0325  NA 138  --  140 142 142  K 2.3*  --  2.8* 4.3 4.4  CL 99*  --  106 112* 110  CO2 29  --  28 25 27   GLUCOSE 119*  --  106* 98 97  BUN 8  --  7 <5* <5*  CREATININE 0.74  --  0.67 0.61 0.71  CALCIUM 8.6*  --  8.3* 8.3* 8.5*  MG  --  1.8 1.7  --   --   PHOS  --  3.0 3.1  --   --    Liver Function Tests:  Recent Labs Lab 12/25/15 1516 12/26/15 0418  AST 21 16  ALT 15 12*  ALKPHOS 96 72  BILITOT 0.5 0.6  PROT 6.5 5.6*  ALBUMIN 3.5 2.9*   No results for input(s): LIPASE, AMYLASE in the last 168 hours. No results for input(s): AMMONIA in the last 168 hours. CBC:  Recent Labs Lab 12/25/15 1516 12/25/15 1615 12/26/15 0418 12/27/15 0515 12/28/15 0325  WBC 15.5* 11.9* 8.4 5.7 5.9  NEUTROABS 11.8*  --   --    --   --   HGB 6.6* 9.1* 8.2* 7.6* 7.6*  HCT 19.7* 27.3* 24.9* 22.7* 23.3*  MCV 101.5* 101.5* 102.0* 101.3* 105.0*  PLT 102* 87* 72* 73* 71*   Cardiac Enzymes: No results for input(s): CKTOTAL, CKMB, CKMBINDEX, TROPONINI in the last 168 hours. BNP: Invalid input(s): POCBNP CBG: No results for input(s): GLUCAP in the last 168 hours. D-Dimer No results for input(s): DDIMER in the last 72 hours. Hgb A1c No results for input(s): HGBA1C in the last 72 hours. Lipid Profile No results for input(s): CHOL, HDL, LDLCALC, TRIG, CHOLHDL, LDLDIRECT in the last 72 hours. Thyroid function studies No results for input(s): TSH, T4TOTAL, T3FREE, THYROIDAB in the last 72 hours.  Invalid input(s): FREET3 Anemia work up No results for input(s): VITAMINB12, FOLATE, FERRITIN, TIBC, IRON, RETICCTPCT in the last 72 hours. Urinalysis    Component Value Date/Time   COLORURINE YELLOW 09/24/2013 1007   APPEARANCEUR CLEAR 09/24/2013 1007   LABSPEC >=1.030 (A) 09/24/2013 1007   PHURINE 5.5 09/24/2013 1007   GLUCOSEU NEGATIVE 09/24/2013 1007   HGBUR SMALL (A) 09/24/2013 1007   BILIRUBINUR neg 06/17/2014 1421   KETONESUR NEGATIVE 09/24/2013 1007   PROTEINUR neg 06/17/2014 1421   UROBILINOGEN negative 06/17/2014 1421   UROBILINOGEN 0.2 09/24/2013 1007   NITRITE neg 06/17/2014 1421   NITRITE NEGATIVE 09/24/2013 1007   LEUKOCYTESUR Negative 06/17/2014 1421   Sepsis Labs Invalid input(s): PROCALCITONIN,  WBC,  LACTICIDVEN Microbiology Recent Results (from the past 240 hour(s))  MRSA PCR Screening     Status: None   Collection Time: 12/26/15 12:35 AM  Result Value Ref Range Status   MRSA by PCR NEGATIVE NEGATIVE Final    Comment:        The GeneXpert MRSA Assay (FDA approved for NASAL specimens only), is one component of a comprehensive MRSA colonization surveillance program. It is not intended to diagnose MRSA infection nor to guide or monitor treatment for MRSA infections.   Aerobic Culture  (superficial specimen)     Status: None (Preliminary result)   Collection Time: 12/27/15 12:50  PM  Result Value Ref Range Status   Specimen Description ABSCESS  Final   Special Requests Immunocompromised  Final   Gram Stain NO WBC SEEN NO ORGANISMS SEEN   Final   Culture   Final    CULTURE REINCUBATED FOR BETTER GROWTH Performed at [facility]    Report Status PENDING  Incomplete     Time coordinating discharge: Over 30 minutes  SIGNED:   Hosie Poisson, MD  [facility] 12/29/2015, 11:35 AM Pager   If 7PM-7AM, please contact night-coverage [email or web address] Password [email or web address]

## 2016-01-08 ENCOUNTER — Inpatient Hospital Stay: Payer: Managed Care, Other (non HMO) | Admitting: Nurse Practitioner

## 2016-01-29 DIAGNOSIS — K219 Gastro-esophageal reflux disease without esophagitis: Secondary | ICD-10-CM | POA: Insufficient documentation

## 2016-02-26 ENCOUNTER — Other Ambulatory Visit: Payer: Self-pay | Admitting: *Deleted

## 2016-02-26 MED ORDER — PANTOPRAZOLE SODIUM 40 MG PO TBEC: 40.0000 mg | DELAYED_RELEASE_TABLET | Freq: Every day | ORAL | 5 refills | Status: DC

## 2016-03-06 ENCOUNTER — Other Ambulatory Visit: Payer: Self-pay | Admitting: Internal Medicine

## 2016-10-08 ENCOUNTER — Other Ambulatory Visit: Payer: Self-pay | Admitting: Internal Medicine

## 2016-11-03 ENCOUNTER — Other Ambulatory Visit: Payer: Self-pay | Admitting: Internal Medicine

## 2016-12-16 ENCOUNTER — Other Ambulatory Visit: Payer: Self-pay

## 2017-01-14 ENCOUNTER — Other Ambulatory Visit: Payer: Self-pay | Admitting: Internal Medicine

## 2017-01-15 NOTE — Telephone Encounter (Signed)
Needs OV.  

## 2017-01-18 ENCOUNTER — Other Ambulatory Visit: Payer: Self-pay | Admitting: Internal Medicine

## 2017-03-07 ENCOUNTER — Ambulatory Visit: Payer: Managed Care, Other (non HMO) | Admitting: Internal Medicine

## 2017-03-07 ENCOUNTER — Other Ambulatory Visit (INDEPENDENT_AMBULATORY_CARE_PROVIDER_SITE_OTHER): Payer: Managed Care, Other (non HMO)

## 2017-03-07 ENCOUNTER — Encounter: Payer: Self-pay | Admitting: Internal Medicine

## 2017-03-07 DIAGNOSIS — R1084 Generalized abdominal pain: Secondary | ICD-10-CM

## 2017-03-07 DIAGNOSIS — F4010 Social phobia, unspecified: Secondary | ICD-10-CM

## 2017-03-07 DIAGNOSIS — F17200 Nicotine dependence, unspecified, uncomplicated: Secondary | ICD-10-CM | POA: Diagnosis not present

## 2017-03-07 DIAGNOSIS — R32 Unspecified urinary incontinence: Secondary | ICD-10-CM

## 2017-03-07 DIAGNOSIS — K580 Irritable bowel syndrome with diarrhea: Secondary | ICD-10-CM | POA: Diagnosis not present

## 2017-03-07 DIAGNOSIS — C50919 Malignant neoplasm of unspecified site of unspecified female breast: Secondary | ICD-10-CM | POA: Diagnosis not present

## 2017-03-07 LAB — BASIC METABOLIC PANEL
BUN: 20 mg/dL (ref 6–23)
CO2: 29 mEq/L (ref 19–32)
Calcium: 9 mg/dL (ref 8.4–10.5)
Chloride: 107 mEq/L (ref 96–112)
Creatinine, Ser: 0.88 mg/dL (ref 0.40–1.20)
GFR: 70.55 mL/min (ref >60.00)
GLUCOSE: 100 mg/dL — AB (ref 70–99)
POTASSIUM: 3.9 meq/L (ref 3.5–5.1)
Sodium: 144 mEq/L (ref 135–145)

## 2017-03-07 LAB — CBC WITH DIFFERENTIAL/PLATELET
BASOS PCT: 0.6 % (ref 0.0–3.0)
Basophils Absolute: 0.1 10*3/uL (ref 0.0–0.1)
EOS ABS: 0.1 10*3/uL (ref 0.0–0.7)
Eosinophils Relative: 1.1 % (ref 0.0–5.0)
HEMATOCRIT: 41.4 % (ref 36.0–46.0)
HEMOGLOBIN: 14.2 g/dL (ref 12.0–15.0)
LYMPHS PCT: 36.9 % (ref 12.0–46.0)
Lymphs Abs: 3.1 10*3/uL (ref 0.7–4.0)
MCHC: 34.4 g/dL (ref 30.0–36.0)
MCV: 96.7 fl (ref 78.0–100.0)
Monocytes Absolute: 0.6 10*3/uL (ref 0.1–1.0)
Monocytes Relative: 7.2 % (ref 3.0–12.0)
Neutro Abs: 4.5 10*3/uL (ref 1.4–7.7)
Neutrophils Relative %: 54.2 % (ref 43.0–77.0)
PLATELETS: 173 10*3/uL (ref 150.0–400.0)
RBC: 4.28 Mil/uL (ref 3.87–5.11)
RDW: 12.8 % (ref 11.5–15.5)
WBC: 8.3 10*3/uL (ref 4.0–10.5)

## 2017-03-07 LAB — URINALYSIS
BILIRUBIN URINE: NEGATIVE
Ketones, ur: NEGATIVE
Leukocytes, UA: NEGATIVE
NITRITE: NEGATIVE
Specific Gravity, Urine: >=1.03 — AB (ref 1.000–1.030)
Total Protein, Urine: NEGATIVE
Urine Glucose: NEGATIVE
Urobilinogen, UA: 0.2 (ref 0.0–1.0)
pH: 5.5 (ref 5.0–8.0)

## 2017-03-07 LAB — HEPATIC FUNCTION PANEL
ALBUMIN: 4.3 g/dL (ref 3.5–5.2)
ALT: 14 U/L (ref 0–35)
AST: 17 U/L (ref 0–37)
Alkaline Phosphatase: 53 U/L (ref 39–117)
BILIRUBIN TOTAL: 0.3 mg/dL (ref 0.2–1.2)
Bilirubin, Direct: 0.1 mg/dL (ref 0.0–0.3)
Total Protein: 6.8 g/dL (ref 6.0–8.3)

## 2017-03-07 LAB — TSH: TSH: 1.32 u[IU]/mL (ref 0.35–4.50)

## 2017-03-07 LAB — CORTISOL: CORTISOL PLASMA: 11.9 ug/dL

## 2017-03-07 LAB — VITAMIN D 25 HYDROXY (VIT D DEFICIENCY, FRACTURES): VITD: 38.56 ng/mL (ref 30.00–100.00)

## 2017-03-07 LAB — VITAMIN B12: VITAMIN B 12: 1080 pg/mL — AB (ref 211–911)

## 2017-03-07 LAB — SEDIMENTATION RATE: Sed Rate: 4 mm/hr (ref 0–30)

## 2017-03-07 MED ORDER — MIRABEGRON ER 50 MG PO TB24: 50.0000 mg | ORAL_TABLET | Freq: Every day | ORAL | 11 refills | Status: DC

## 2017-03-07 MED ORDER — SUMATRIPTAN SUCCINATE 100 MG PO TABS: 100.0000 mg | ORAL_TABLET | Freq: Every day | ORAL | 3 refills | Status: DC | PRN

## 2017-03-07 NOTE — Assessment & Plan Note (Signed)
Stopped smoking in 2019

## 2017-03-07 NOTE — Assessment & Plan Note (Signed)
Dr Jodi Mourning

## 2017-03-07 NOTE — Assessment & Plan Note (Addendum)
GI ref  Will ref to Dr Earlean Shawl  try gluten free and milk free diet

## 2017-03-07 NOTE — Assessment & Plan Note (Signed)
On Tamoxifen

## 2017-03-07 NOTE — Patient Instructions (Signed)
Lactose Intolerance, Adult Lactose is the natural sugar found in milk and milk products, such as cheese and yogurt. Lactose is digested by lactase, an enzyme in your small intestine. Some people do not produce enough lactase to digest lactose. This is called lactose intolerance. Lactose intolerance is different from milk allergy, which is a more serious reaction to the protein in milk. What are the causes? Causes of lactose intolerance may include:  Normal aging. The ability to produce lactase may decline with age, causing lactose intolerance over time.  Being born without the ability to make lactase.  Digestive diseases such as gastroenteritis or inflammatory bowel disease.  Surgery or injuries to your small intestine.  Infection in your intestines.  Certain antibiotic medicines and cancer treatments.  What are the signs or symptoms? Lactose intolerance can cause uncomfortable symptoms. These are likely to occur within 30 minutes to 2 hours after eating or drinking foods containing lactose. Symptoms of lactose intolerance may include:  Nausea.  Diarrhea.  Abdominal cramps or pain.  Bloating.  Gas.  How is this diagnosed? There are several tests your health care provider can do to diagnose lactose intolerance. These tests include a hydrogen breath test and stool acidity test. How is this treated? No treatment can improve your body's ability to produce lactase. However, your symptoms can be controlled by limiting or avoiding milk products and other sources of lactose and adjusting your diet. Lactose-free milk is often tolerated. Lactose digestion may also be improved by adding lactase drops to regular milk or by taking lactase tablets when dairy products are consumed. Tolerance to lactose is individual. Some people may be able to eat or drink small amounts of products with lactose, while other may need to avoid lactose entirely. Talk to your health care provider about what is best  for you. Follow these instructions at home:  Limit or avoidfoods, beverages, and medicines containing lactose as directed by your health care provider.  Read food and medicine labels carefully to avoid products containing lactose, milk solids, casein, or whey.  If you eliminate dairy products, replace the protein, calcium, vitamin D, and other nutrients they contain through other foods. A registered dietitian or your health care provider can help you adjust your diet.  Choose a milk substitute that is fortified with calcium and vitamin D. Be aware that soy milk contains high quality protein, while milks made from nuts or grains contain very little protein.  Use lactase drops or tablets if directed by your health care provider. Contact a health care provider if: You have no relief from your symptoms after eliminating milk products and other sources of lactose. This information is not intended to replace advice given to you by your health care provider. Make sure you discuss any questions you have with your health care provider. Document Released: 01/07/2005 Document Revised: 06/15/2015 Document Reviewed: 04/09/2013 Elsevier Interactive Patient Education  2018 [employer].         Gluten free trial for 4-6 weeks. OK to use gluten-free bread and gluten-free pasta.    Gluten-Free Diet for Celiac Disease, Adult The gluten-free diet includes all foods that do not contain gluten. Gluten is a protein that is found in wheat, rye, barley, and some other grains. Following the gluten-free diet is the only treatment for people with celiac disease. It helps to prevent damage to the intestines and improves or eliminates the symptoms of celiac disease. Following the gluten-free diet requires some planning. It can be challenging at first, but  it gets easier with time and practice. There are more gluten-free options available today than ever before. If you need help finding gluten-free foods or if  you have questions, talk with your diet and nutrition specialist (registered dietitian) or your health care provider. What do I need to know about a gluten-free diet?  All fruits, vegetables, and meats are safe to eat and do not contain gluten.  When grocery shopping, start by shopping in the produce, meat, and dairy sections. These sections are more likely to contain gluten-free foods. Then move to the aisles that contain packaged foods if you need to.  Read all food labels. Gluten is often added to foods. Always check the ingredient list and look for warnings, such as "may contain gluten."  Talk with your dietitian or health care provider before taking a gluten-free multivitamin or mineral supplement.  Be aware of gluten-free foods having contact with foods that contain gluten (cross-contamination). This can happen at home and with any processed foods. ? Talk with your health care provider or dietitian about how to reduce the risk of cross-contamination in your home. ? If you have questions about how a food is processed, ask the manufacturer. What key words help to identify gluten? Foods that list any of these key words on the label usually contain gluten:  Wheat, flour, enriched flour, bromated flour, white flour, durum flour, graham flour, phosphated flour, self-rising flour, semolina, farina, barley (malt), rye, and oats.  Starch, dextrin, modified food starch, or cereal.  Thickening, fillers, or emulsifiers.  Malt flavoring, malt extract, or malt syrup.  Hydrolyzed vegetable protein.  In the [place] packaged foods that are gluten-free are required to be labeled "GF." These foods should be easy to identify and are safe to eat. In the [place] food companies are also required to list common food allergens, including wheat, on their labels. Recommended foods Grains  Amaranth, bean flours, 100% buckwheat flour, corn, millet, nut flours or nut meals, GF oats, quinoa, rice, sorghum, teff,  rice wafers, pure cornmeal tortillas, popcorn, and hot cereals made from cornmeal. Hominy, rice, wild rice. Some Asian rice noodles or bean noodles. Arrowroot starch, corn bran, corn flour, corn germ, cornmeal, corn starch, potato flour, potato starch flour, and rice bran. Plain, brown, and sweet rice flours. Rice polish, soy flour, and tapioca starch. Vegetables  All plain fresh, frozen, and canned vegetables. Fruits  All plain fresh, frozen, canned, and dried fruits, and 100% fruit juices. Meats and other protein foods  All fresh beef, pork, poultry, fish, seafood, and eggs. Fish canned in water, oil, brine, or vegetable broth. Plain nuts and seeds, peanut butter. Some lunch meat and some frankfurters. Dried beans, dried peas, and lentils. Dairy  Fresh plain, dry, evaporated, or condensed milk. Cream, butter, sour cream, whipping cream, and most yogurts. Unprocessed cheese, most processed cheeses, some cottage cheese, some cream cheeses. Beverages  Coffee, tea, most herbal teas. Carbonated beverages and some root beers. Wine, sake, and distilled spirits, such as gin, vodka, and whiskey. Most hard ciders. Fats and oils  Butter, margarine, vegetable oil, hydrogenated butter, olive oil, shortening, lard, cream, and some mayonnaise. Some commercial salad dressings. Olives. Sweets and desserts  Sugar, honey, some syrups, molasses, jelly, and jam. Plain hard candy, marshmallows, and gumdrops. Pure cocoa powder. Plain chocolate. Custard and some pudding mixes. Gelatin desserts, sorbets, frozen ice pops, and sherbet. Cake, cookies, and other desserts prepared with allowed flours. Some commercial ice creams. Cornstarch, tapioca, and rice puddings. Seasoning and other  foods  Some canned or frozen soups. Monosodium glutamate (MSG). Cider, rice, and wine vinegar. Baking soda and baking powder. Cream of tartar. Baking and nutritional yeast. Certain soy sauces made without wheat (ask your dietitian about  specific brands that are allowed). Nuts, coconut, and chocolate. Salt, pepper, herbs, spices, flavoring extracts, imitation or artificial flavorings, natural flavorings, and food colorings. Some medicines and supplements. Some lip glosses and other cosmetics. Rice syrups. The items listed may not be a complete list. Talk with your dietitian about what dietary choices are best for you. Foods to avoid Grains  Barley, bran, bulgur, couscous, cracked wheat, [place], farro, graham, malt, matzo, semolina, wheat germ, and all wheat and rye cereals including spelt and kamut. Cereals containing malt as a flavoring, such as rice cereal. Noodles, spaghetti, macaroni, most packaged rice mixes, and all mixes containing wheat, rye, barley, or triticale. Vegetables  Most creamed vegetables and most vegetables canned in sauces. Some commercially prepared vegetables and salads. Fruits  Thickened or prepared fruits and some pie fillings. Some fruit snacks and fruit roll-ups. Meats and other protein foods  Any meat or meat alternative containing wheat, rye, barley, or gluten stabilizers. These are often marinated or packaged meats and lunch meats. Bread-containing products, such as Swiss steak, croquettes, meatballs, and meatloaf. Most tuna canned in vegetable broth and [place] with hydrolyzed vegetable protein (HVP) injected as part of the basting. Seitan. Imitation fish. Eggs in sauces made from ingredients to avoid. Dairy  Commercial chocolate milk drinks and malted milk. Some non-dairy creamers. Any cheese product containing ingredients to avoid. Beverages  Certain cereal beverages. Beer, ale, malted milk, and some root beers. Some hard ciders. Some instant flavored coffees. Some herbal teas made with barley or with barley malt added. Fats and oils  Some commercial salad dressings. Sour cream containing modified food starch. Sweets and desserts  Some toffees. Chocolate-coated nuts (may be rolled in wheat  flour) and some commercial candies and candy bars. Most cakes, cookies, donuts, pastries, and other baked goods. Some commercial ice cream. Ice cream cones. Commercially prepared mixes for cakes, cookies, and other desserts. Bread pudding and other puddings thickened with flour. Products containing brown rice syrup made with barley malt enzyme. Desserts and sweets made with malt flavoring. Seasoning and other foods  Some curry powders, some dry seasoning mixes, some gravy extracts, some meat sauces, some ketchups, some prepared mustards, and horseradish. Certain soy sauces. Malt vinegar. Bouillon and bouillon cubes that contain HVP. Some chip dips, and some chewing gum. Yeast extract. Brewer's yeast. Caramel color. Some medicines and supplements. Some lip glosses and other cosmetics. The items listed may not be a complete list. Talk with your dietitian about what dietary choices are best for you. Summary  Gluten is a protein that is found in wheat, rye, barley, and some other grains. The gluten-free diet includes all foods that do not contain gluten.  If you need help finding gluten-free foods or if you have questions, talk with your diet and nutrition specialist (registered dietitian) or your health care provider.  Read all food labels. Gluten is often added to foods. Always check the ingredient list and look for warnings, such as "may contain gluten." This information is not intended to replace advice given to you by your health care provider. Make sure you discuss any questions you have with your health care provider. Document Released: 01/07/2005 Document Revised: 10/23/2015 Document Reviewed: 10/23/2015 Elsevier Interactive Patient Education  2018 [employer].

## 2017-03-07 NOTE — Assessment & Plan Note (Signed)
Buspar, Trintellix - Dr Godfrey Pick

## 2017-03-07 NOTE — Progress Notes (Signed)
Subjective:  Patient ID: Shelly Green, female    DOB: 06-29-1960  Age: 57 y.o. MRN: [id number]  CC: No chief complaint on file.   HPI Shelly Green presents for urinary leakage. On Tamoxifen for breast cancer C/o LBP C/o diarrhea, bloating, pain Quit smoking 5 weeks ago F/u depression - Dr Arvella Nigh   Outpatient Medications Prior to Visit  Medication Sig Dispense Refill  . amphetamine-dextroamphetamine (ADDERALL) 20 MG tablet Take 20 mg by mouth 2 (two) times daily.   0  . busPIRone (BUSPAR) 7.5 MG tablet TAKE 2 TABLETS BY MOUTH TWICE A DAY 120 tablet 0  . butalbital-acetaminophen-caffeine (FIORICET, ESGIC) 50-325-40 MG per tablet TAKE 2 TABLETS BY MOUTH EVERY 6 HOURS AS NEEDED 40 tablet 2  . clonazePAM (KLONOPIN) 0.5 MG tablet Take 0.25-0.5 mg by mouth 2 (two) times daily.  1  . dexamethasone (DECADRON) 2 MG tablet Take 10 mg by mouth once. Take 10mg  by mouth on the day BEFORE chemo    . diphenhydrAMINE (BENADRYL) 25 mg capsule Take 1 capsule (25 mg total) by mouth every 6 (six) hours as needed for itching or sleep. 10 capsule 0  . diphenhydrAMINE-zinc acetate (BENADRYL) cream Apply topically 3 (three) times daily as needed for itching. 28.4 g 0  . loratadine (CLARITIN) 10 MG tablet Take 10 mg by mouth daily.    . Multiple Vitamin (MULTIVITAMIN WITH MINERALS) TABS tablet Take 1 tablet by mouth 2 (two) times daily.    . pantoprazole (PROTONIX) 40 MG tablet Take 1 tablet (40 mg total) by mouth daily. Overdue for annual appt must see MD for refills 30 tablet 0  . saccharomyces boulardii (FLORASTOR) 250 MG capsule Take 1 capsule (250 mg total) by mouth 2 (two) times daily. 30 capsule 0  . SUMAtriptan (IMITREX) 100 MG tablet TAKE 1 TABLET BY MOUTH EVERY DAY AS NEEDED FOR MIGRAINE. MAY REPEAT IN 2 HOURS IF HEADACHE PERSISTS 12 tablet 3  . TRINTELLIX 20 MG TABS Take 20 mg by mouth every morning.  2  . busPIRone (BUSPAR) 15 MG tablet TAKE 1 TABLET BY MOUTH TWICE A DAY 60 tablet 3    No facility-administered medications prior to visit.     ROS Review of Systems  Constitutional: Positive for chills, diaphoresis and fatigue. Negative for activity change, appetite change and unexpected weight change.  HENT: Negative for congestion, mouth sores and sinus pressure.   Eyes: Negative for visual disturbance.  Respiratory: Negative for cough and chest tightness.   Gastrointestinal: Positive for abdominal pain and diarrhea. Negative for nausea.  Genitourinary: Negative for difficulty urinating, frequency and vaginal pain.  Musculoskeletal: Negative for back pain and gait problem.  Skin: Negative for pallor and rash.  Neurological: Negative for dizziness, tremors, weakness, numbness and headaches.  Psychiatric/Behavioral: Negative for confusion, sleep disturbance and suicidal ideas. The patient is nervous/anxious.     Objective:  BP 132/78 (BP Location: Left Arm, Patient Position: Sitting, Cuff Size: Normal)   Pulse 72   Temp 98.4 F (36.9 C) (Oral)   Ht 5\' 2"  (1.575 m)   Wt 138 lb (62.6 kg)   SpO2 99%   BMI 25.24 kg/m   BP Readings from Last 3 Encounters:  03/07/17 132/78  12/28/15 120/70  08/23/15 (!) 148/100    Wt Readings from Last 3 Encounters:  03/07/17 138 lb (62.6 kg)  12/25/15 130 lb 1.1 oz (59 kg)  08/23/15 128 lb (58.1 kg)    Physical Exam  Constitutional: She appears well-developed. No  distress.  HENT:  Head: Normocephalic.  Right Ear: External ear normal.  Left Ear: External ear normal.  Nose: Nose normal.  Mouth/Throat: Oropharynx is clear and moist.  Eyes: Conjunctivae are normal. Pupils are equal, round, and reactive to light. Right eye exhibits no discharge. Left eye exhibits no discharge.  Neck: Normal range of motion. Neck supple. No JVD present. No tracheal deviation present. No thyromegaly present.  Cardiovascular: Normal rate, regular rhythm and normal heart sounds.  Pulmonary/Chest: No stridor. No respiratory distress. She has no  wheezes.  Abdominal: Soft. Bowel sounds are normal. She exhibits no distension and no mass. There is no tenderness. There is no rebound and no guarding.  Musculoskeletal: She exhibits no edema or tenderness.  Lymphadenopathy:    She has no cervical adenopathy.  Neurological: She displays normal reflexes. No cranial nerve deficit. She exhibits normal muscle tone. Coordination normal.  Skin: No rash noted. No erythema.  Psychiatric: She has a normal mood and affect. Her behavior is normal. Judgment and thought content normal.   abd is sensitive   Lab Results  Component Value Date   WBC 5.9 12/28/2015   HGB 7.6 (L) 12/28/2015   HCT 23.3 (L) 12/28/2015   PLT 71 (L) 12/28/2015   GLUCOSE 97 12/28/2015   CHOL 186 05/14/2013   TRIG 199.0 (H) 05/14/2013   HDL 37.30 (L) 05/14/2013   LDLCALC 109 (H) 05/14/2013   ALT 12 (L) 12/26/2015   AST 16 12/26/2015   NA 142 12/28/2015   K 4.4 12/28/2015   CL 110 12/28/2015   CREATININE 0.71 12/28/2015   BUN <5 (L) 12/28/2015   CO2 27 12/28/2015   TSH 1.147 12/26/2015   INR 1.02 09/28/2014    No results found.  Assessment & Plan:   There are no diagnoses linked to this encounter. I am having Shelly Green maintain her amphetamine-dextroamphetamine, loratadine, butalbital-acetaminophen-caffeine, SUMAtriptan, clonazePAM, dexamethasone, multivitamin with minerals, diphenhydrAMINE, diphenhydrAMINE-zinc acetate, saccharomyces boulardii, pantoprazole, busPIRone, and TRINTELLIX.  No orders of the defined types were placed in this encounter.    Follow-up: No Follow-up on file.  Walker Kehr, MD

## 2017-03-07 NOTE — Assessment & Plan Note (Addendum)
On Buspar  try gluten free and milk free diet

## 2017-03-14 ENCOUNTER — Telehealth: Payer: Self-pay

## 2017-03-14 NOTE — Telephone Encounter (Signed)
Key: [id number]

## 2017-03-14 NOTE — Telephone Encounter (Signed)
PA denied.  Patient must try any of the following alternatives: darifenacin ER (generic Enablex), oxybutynin, tolterodine (generic Detrol), trospium?

## 2017-03-14 NOTE — Telephone Encounter (Signed)
error 

## 2017-03-16 MED ORDER — DARIFENACIN HYDROBROMIDE ER 15 MG PO TB24: 15.0000 mg | ORAL_TABLET | Freq: Every day | ORAL | 11 refills | Status: DC

## 2017-03-16 NOTE — Addendum Note (Signed)
Addended by: Cassandria Anger on: 03/16/2017 11:13 PM   Modules accepted: Orders

## 2017-03-16 NOTE — Telephone Encounter (Signed)
Okay Enablex. Thank you

## 2017-03-17 NOTE — Telephone Encounter (Signed)
Husband notified  

## 2017-07-19 DIAGNOSIS — Z9010 Acquired absence of unspecified breast and nipple: Secondary | ICD-10-CM | POA: Insufficient documentation

## 2017-07-19 DIAGNOSIS — Z853 Personal history of malignant neoplasm of breast: Secondary | ICD-10-CM | POA: Insufficient documentation

## 2017-07-24 ENCOUNTER — Other Ambulatory Visit: Payer: Self-pay | Admitting: Internal Medicine

## 2017-08-23 ENCOUNTER — Other Ambulatory Visit: Payer: Self-pay | Admitting: Internal Medicine

## 2017-11-20 DIAGNOSIS — F32A Depression, unspecified: Secondary | ICD-10-CM | POA: Insufficient documentation

## 2018-06-24 DIAGNOSIS — G5603 Carpal tunnel syndrome, bilateral upper limbs: Secondary | ICD-10-CM | POA: Insufficient documentation

## 2018-09-22 ENCOUNTER — Other Ambulatory Visit: Payer: Self-pay | Admitting: Emergency Medicine

## 2018-09-22 DIAGNOSIS — Z20822 Contact with and (suspected) exposure to covid-19: Secondary | ICD-10-CM

## 2018-09-24 ENCOUNTER — Telehealth: Payer: Self-pay | Admitting: Internal Medicine

## 2018-09-24 LAB — NOVEL CORONAVIRUS, NAA: SARS-CoV-2, NAA: NOT DETECTED

## 2018-09-24 NOTE — Telephone Encounter (Signed)
Shelly Green MRN: [id number] patient of Dr. Alain Marion was tested positive for COVID and spouse was tested negative. Patient seeking clinical advice from PCP regarding quarantine and can she return back to work and how long should she stay way from her positive spouse who lives in the house with her, please advise

## 2018-09-25 NOTE — Telephone Encounter (Signed)
Pt informed of below.  

## 2018-09-25 NOTE — Telephone Encounter (Signed)
Quarantine for 2 weeks, unless her job has different guidelines [id number] to work. Thx

## 2018-11-30 ENCOUNTER — Other Ambulatory Visit: Payer: Self-pay | Admitting: *Deleted

## 2018-11-30 DIAGNOSIS — Z20822 Contact with and (suspected) exposure to covid-19: Secondary | ICD-10-CM

## 2018-12-02 LAB — NOVEL CORONAVIRUS, NAA: SARS-CoV-2, NAA: NOT DETECTED

## 2018-12-03 ENCOUNTER — Encounter: Payer: Self-pay | Admitting: Internal Medicine

## 2018-12-03 ENCOUNTER — Ambulatory Visit (INDEPENDENT_AMBULATORY_CARE_PROVIDER_SITE_OTHER): Payer: Managed Care, Other (non HMO) | Admitting: Internal Medicine

## 2018-12-03 ENCOUNTER — Other Ambulatory Visit: Payer: Self-pay

## 2018-12-03 DIAGNOSIS — G43109 Migraine with aura, not intractable, without status migrainosus: Secondary | ICD-10-CM | POA: Diagnosis not present

## 2018-12-03 DIAGNOSIS — F988 Other specified behavioral and emotional disorders with onset usually occurring in childhood and adolescence: Secondary | ICD-10-CM | POA: Diagnosis not present

## 2018-12-03 DIAGNOSIS — M79641 Pain in right hand: Secondary | ICD-10-CM

## 2018-12-03 MED ORDER — SUMATRIPTAN SUCCINATE 100 MG PO TABS: 100.0000 mg | ORAL_TABLET | Freq: Every day | ORAL | 3 refills | Status: DC | PRN

## 2018-12-03 MED ORDER — BUTALBITAL-APAP-CAFFEINE 50-325-40 MG PO TABS: 2.0000 | ORAL_TABLET | Freq: Four times a day (QID) | ORAL | 1 refills | Status: DC | PRN

## 2018-12-03 NOTE — Assessment & Plan Note (Signed)
Imitrex, Zofran and diclofenac prn Fioricet prn severe HA

## 2018-12-03 NOTE — Progress Notes (Signed)
Subjective:  Patient ID: Shelly Green, female    DOB: 1961/01/03  Age: 58 y.o. MRN: [id number]  CC: No chief complaint on file.   HPI Shelly Green presents for pain in the R hand, R shoulder, R ring finger Lifting at work - [employer]. [name] saw Dr Levell July office doctor checked for CTS C/o ADD F/u HAs  Outpatient Medications Prior to Visit  Medication Sig Dispense Refill  . amphetamine-dextroamphetamine (ADDERALL) 20 MG tablet Take 20 mg by mouth 2 (two) times daily.   0  . busPIRone (BUSPAR) 7.5 MG tablet TAKE 2 TABLETS BY MOUTH TWICE A DAY 360 tablet 1  . butalbital-acetaminophen-caffeine (FIORICET, ESGIC) 50-325-40 MG per tablet TAKE 2 TABLETS BY MOUTH EVERY 6 HOURS AS NEEDED 40 tablet 2  . clonazePAM (KLONOPIN) 0.5 MG tablet Take 0.25-0.5 mg by mouth 2 (two) times daily.  1  . darifenacin (ENABLEX) 15 MG 24 hr tablet Take 1 tablet (15 mg total) by mouth daily. 30 tablet 11  . dexamethasone (DECADRON) 2 MG tablet Take 10 mg by mouth once. Take 10mg  by mouth on the day BEFORE chemo    . diphenhydrAMINE (BENADRYL) 25 mg capsule Take 1 capsule (25 mg total) by mouth every 6 (six) hours as needed for itching or sleep. 10 capsule 0  . diphenhydrAMINE-zinc acetate (BENADRYL) cream Apply topically 3 (three) times daily as needed for itching. 28.4 g 0  . loratadine (CLARITIN) 10 MG tablet Take 10 mg by mouth daily.    . mirabegron ER (MYRBETRIQ) 50 MG TB24 tablet Take 1 tablet (50 mg total) by mouth daily. 30 tablet 11  . Multiple Vitamin (MULTIVITAMIN WITH MINERALS) TABS tablet Take 1 tablet by mouth 2 (two) times daily.    . pantoprazole (PROTONIX) 40 MG tablet Take 1 tablet (40 mg total) by mouth daily. Overdue for annual appt must see MD for refills 30 tablet 0  . saccharomyces boulardii (FLORASTOR) 250 MG capsule Take 1 capsule (250 mg total) by mouth 2 (two) times daily. 30 capsule 0  . SUMAtriptan (IMITREX) 100 MG tablet Take 1 tablet (100 mg total) by mouth daily as  needed for migraine. May repeat in 2 hours if headache persists or recurs. 12 tablet 3  . TRINTELLIX 20 MG TABS Take 20 mg by mouth every morning.  2   No facility-administered medications prior to visit.     ROS: Review of Systems  Constitutional: Positive for unexpected weight change. Negative for activity change, appetite change, chills and fatigue.  HENT: Negative for congestion, mouth sores and sinus pressure.   Eyes: Negative for visual disturbance.  Respiratory: Negative for cough and chest tightness.   Gastrointestinal: Negative for abdominal pain and nausea.  Genitourinary: Negative for difficulty urinating, frequency and vaginal pain.  Musculoskeletal: Positive for arthralgias. Negative for back pain and gait problem.  Skin: Negative for pallor and rash.  Neurological: Negative for dizziness, tremors, weakness, numbness and headaches.  Psychiatric/Behavioral: Positive for decreased concentration. Negative for confusion and sleep disturbance.    Objective:  BP 136/84 (BP Location: Right Arm, Patient Position: Sitting, Cuff Size: Normal)   Pulse 70   Temp 98.1 F (36.7 C) (Oral)   Ht 5\' 2"  (1.575 m)   Wt 115 lb (52.2 kg)   SpO2 97%   BMI 21.03 kg/m   BP Readings from Last 3 Encounters:  12/03/18 136/84  03/07/17 132/78  12/28/15 120/70    Wt Readings from Last 3 Encounters:  12/03/18 115 lb (52.2  kg)  03/07/17 138 lb (62.6 kg)  12/25/15 130 lb 1.1 oz (59 kg)    Physical Exam Constitutional:      General: She is not in acute distress.    Appearance: Normal appearance. She is well-developed.  HENT:     Head: Normocephalic.     Right Ear: External ear normal.     Left Ear: External ear normal.     Nose: Nose normal.  Eyes:     General:        Right eye: No discharge.        Left eye: No discharge.     Conjunctiva/sclera: Conjunctivae normal.     Pupils: Pupils are equal, round, and reactive to light.  Neck:     Musculoskeletal: Normal range of motion  and neck supple.     Thyroid: No thyromegaly.     Vascular: No JVD.     Trachea: No tracheal deviation.  Cardiovascular:     Rate and Rhythm: Normal rate and regular rhythm.     Heart sounds: Normal heart sounds.  Pulmonary:     Effort: No respiratory distress.     Breath sounds: No stridor. No wheezing.  Abdominal:     General: Bowel sounds are normal. There is no distension.     Palpations: Abdomen is soft. There is no mass.     Tenderness: There is no abdominal tenderness. There is no guarding or rebound.  Musculoskeletal:        General: No tenderness.  Lymphadenopathy:     Cervical: No cervical adenopathy.  Skin:    Findings: No erythema or rash.  Neurological:     Cranial Nerves: No cranial nerve deficit.     Motor: No abnormal muscle tone.     Coordination: Coordination normal.     Deep Tendon Reflexes: Reflexes normal.  Psychiatric:        Behavior: Behavior normal.        Thought Content: Thought content normal.        Judgment: Judgment normal.   Thin R 3d finger - ganglin cyst at MCP - painful  Lab Results  Component Value Date   WBC 8.3 03/07/2017   HGB 14.2 03/07/2017   HCT 41.4 03/07/2017   PLT 173.0 03/07/2017   GLUCOSE 100 (H) 03/07/2017   CHOL 186 05/14/2013   TRIG 199.0 (H) 05/14/2013   HDL 37.30 (L) 05/14/2013   LDLCALC 109 (H) 05/14/2013   ALT 14 03/07/2017   AST 17 03/07/2017   NA 144 03/07/2017   K 3.9 03/07/2017   CL 107 03/07/2017   CREATININE 0.88 03/07/2017   BUN 20 03/07/2017   CO2 29 03/07/2017   TSH 1.32 03/07/2017   INR 1.02 09/28/2014    No results found.  Assessment & Plan:   There are no diagnoses linked to this encounter.   No orders of the defined types were placed in this encounter.    Follow-up: No follow-ups on file.  Walker Kehr, MD

## 2018-12-03 NOTE — Assessment & Plan Note (Signed)
Needs a new psychiatrist On Adderall

## 2018-12-03 NOTE — Assessment & Plan Note (Signed)
R 3d finger - ganglin cyst at MCP - painful Sports Med ref Padded gloves

## 2018-12-08 DIAGNOSIS — Z79811 Long term (current) use of aromatase inhibitors: Secondary | ICD-10-CM | POA: Insufficient documentation

## 2018-12-14 NOTE — Progress Notes (Signed)
[facility] [place], [place] Phone: [phone] Subjective:   Shelly Green, am serving as a scribe for Dr. Hulan Saas.  I'm seeing this patient by the request  of:  Plotnikov, Evie Lacks, MD     This visit occurred during the SARS-CoV-2 public health emergency.  Safety protocols were in place, including screening questions prior to the visit, additional usage of staff PPE, and extensive cleaning of exam room while observing appropriate contact time as indicated for disinfecting solutions.    CC: Right hand pain  [id number]  Shelly Green is a 58 y.o. female coming in with complaint of right hand pain. Has cyst over the 3rd MCP. Has swelling and discoloration for past 6 months. Had EMG for wrist and arm pain. Patient works at [employer] and [employer] at [employer] and states that her work can exacerbate pain. Pain is improving but is still present.      Past Medical History:  Diagnosis Date  . Arthritis   . Cancer [facility])    breast   . History of colon polyps   . IBS (irritable bowel syndrome)   . Menopausal disorder   . Migraines   . Seasonal allergies   . Social anxiety disorder    Past Surgical History:  Procedure Laterality Date  . CARPAL TUNNEL RELEASE  08/20/2011   Procedure: CARPAL TUNNEL RELEASE;  Surgeon: Cammie Sickle., MD;  Location: [facility];  Service: Orthopedics;  Laterality: Right;  . HEMORROIDECTOMY    . LAPAROSCOPIC CHOLECYSTECTOMY  2007  . TOTAL ABDOMINAL HYSTERECTOMY     Social History   Socioeconomic History  . Marital status: Married    Spouse name: Not on file  . Number of children: Not on file  . Years of education: Not on file  . Highest education level: Not on file  Occupational History  . Not on file  Social Needs  . Financial resource strain: Not on file  . Food insecurity    Worry: Not on file    Inability: Not on file  . Transportation needs     Medical: Not on file    Non-medical: Not on file  Tobacco Use  . Smoking status: Former Smoker    Packs/day: 1.50    Years: 35.00    Pack years: 52.50    Types: Cigarettes    Quit date: 01/24/2017    Years since quitting: 1.8  . Smokeless tobacco: Never Used  Substance and Sexual Activity  . Alcohol use: Green    Alcohol/week: 0.0 standard drinks  . Drug use: Green  . Sexual activity: Yes  Lifestyle  . Physical activity    Days per week: Not on file    Minutes per session: Not on file  . Stress: Not on file  Relationships  . Social Herbalist on phone: Not on file    Gets together: Not on file    Attends religious service: Not on file    Active member of club or organization: Not on file    Attends meetings of clubs or organizations: Not on file    Relationship status: Not on file  Other Topics Concern  . Not on file  Social History Narrative  . Not on file   Allergies  Allergen Reactions  . Codeine Anaphylaxis  . Hydrocodone Shortness Of Breath  . Penicillins Anaphylaxis    As a child Has patient  had a PCN reaction causing immediate rash, facial/tongue/throat swelling, SOB or lightheadedness with hypotension: yes Has patient had a PCN reaction causing severe rash involving mucus membranes or skin necrosis: unknown Has patient had a PCN reaction that required hospitalization: yes Has patient had a PCN reaction occurring within the last 10 years: Green If all of the above answers are "Green", then may proceed with Cephalosporin use.   . Sulfa Antibiotics Anaphylaxis and Rash  . Tramadol Shortness Of Breath  . Clindamycin/Lincomycin     rush   Family History  Problem Relation Age of Onset  . Arthritis Mother   . Lymphoma Mother   . Colon cancer Sister 42  . Mental illness Daughter   . Mental illness Maternal Aunt   . Arthritis Maternal Grandmother   . Hypertension Maternal Grandfather     Current Outpatient Medications (Endocrine & Metabolic):  .   dexamethasone (DECADRON) 2 MG tablet, Take 10 mg by mouth once. Take 10mg  by mouth on the day BEFORE chemo   Current Outpatient Medications (Respiratory):  .  diphenhydrAMINE (BENADRYL) 25 mg capsule, Take 1 capsule (25 mg total) by mouth every 6 (six) hours as needed for itching or sleep. Marland Kitchen  loratadine (CLARITIN) 10 MG tablet, Take 10 mg by mouth daily.  Current Outpatient Medications (Analgesics):  .  butalbital-acetaminophen-caffeine (FIORICET) 50-325-40 MG tablet, Take 2 tablets by mouth every 6 (six) hours as needed. .  SUMAtriptan (IMITREX) 100 MG tablet, Take 1 tablet (100 mg total) by mouth daily as needed for migraine. May repeat in 2 hours if headache persists or recurs.   Current Outpatient Medications (Other):  .  amphetamine-dextroamphetamine (ADDERALL) 20 MG tablet, Take 20 mg by mouth 2 (two) times daily.  .  busPIRone (BUSPAR) 7.5 MG tablet, TAKE 2 TABLETS BY MOUTH TWICE A DAY .  darifenacin (ENABLEX) 15 MG 24 hr tablet, Take 1 tablet (15 mg total) by mouth daily. .  mirabegron ER (MYRBETRIQ) 50 MG TB24 tablet, Take 1 tablet (50 mg total) by mouth daily. .  Multiple Vitamin (MULTIVITAMIN WITH MINERALS) TABS tablet, Take 1 tablet by mouth 2 (two) times daily. Marland Kitchen  saccharomyces boulardii (FLORASTOR) 250 MG capsule, Take 1 capsule (250 mg total) by mouth 2 (two) times daily. .  TRINTELLIX 20 MG TABS, Take 20 mg by mouth every morning. .  clonazePAM (KLONOPIN) 0.5 MG tablet, Take 0.25-0.5 mg by mouth 2 (two) times daily. .  diphenhydrAMINE-zinc acetate (BENADRYL) cream, Apply topically 3 (three) times daily as needed for itching. .  pantoprazole (PROTONIX) 40 MG tablet, Take 1 tablet (40 mg total) by mouth daily. Overdue for annual appt must see MD for refills    Past medical history, social, surgical and family history all reviewed in electronic medical record.  Green pertanent information unless stated regarding to the chief complaint.   Review of Systems:  Green headache, visual  changes, nausea, vomiting, diarrhea, constipation, dizziness, abdominal pain, skin rash, fevers, chills, night swe chest pain, shortness of breath, mood changes.  Positive muscle aches  Objective  Blood pressure 102/60, pulse 79, height 5\' 2"  (1.575 m), SpO2 99 %.    General: Green apparent distress alert and oriented x3 mood and affect normal, dressed appropriately.  HEENT: Pupils equal, extraocular movements intact  Respiratory: Patient's speak in full sentences and does not appear short of breath  Cardiovascular: Green lower extremity edema, non tender, Green erythema  Skin: Warm dry intact with Green signs of infection or rash on extremities or on axial  skeleton.  Abdomen: Soft nontender  Neuro: Cranial nerves II through XII are intact, neurovascularly intact in all extremities with 2+ DTRs and 2+ pulses.  Lymph: Green lymphadenopathy of posterior or anterior cervical chain or axillae bilaterally.  Gait normal with good balance and coordination.  MSK:  tender with full range of motion and good stability and symmetric strength and tone of shoulders, elbows, hip, knee and ankles bilaterally.   Right hand exam shows on the palmar aspect between the third and fourth finger patient does have a nodule noted.  This seems to be somewhat fluctuant.  Green breakdown of the skin.  Tender to palpation in this area.  Mild bruising on the contralateral side.  Limited musculoskeletal ultrasound was performed and interpreted by Lyndal Pulley  Limited musculoskeletal ultrasound of the area of the mass shows that this seems to be a cystic in nature fluid-filled.  Green abnormal vascularity.  Does have some irritation of the surrounding soft tissue. Impression: Likely ganglion cyst versus cystic mass in the palmar region of the hand deep to the subdermal tissue  Procedure: Real-time Ultrasound Guided Injection of right hand mass aspiration Device: GE Logiq Q7 Ultrasound guided injection is preferred based studies that show  increased duration, increased effect, greater accuracy, decreased procedural pain, increased response rate, and decreased cost with ultrasound guided versus blind injection.  Verbal informed consent obtained.  Time-out conducted.  Noted Green overlying erythema, induration, or other signs of local infection.  Skin prepped in a sterile fashion.  Local anesthesia: Topical Ethyl chloride.  With sterile technique and under real time ultrasound guidance: With an 18-gauge 1 inch needle injected with 0.5 cc of 0.5% Marcaine and then had aspiration of gel-like fluid.  Injecting 0.5 cc of 40 mg/mL of Kenalog.  Minimal blood loss. Completed without difficulty  Pain immediately resolved suggesting accurate placement of the medication.  Advised to call if fevers/chills, erythema, induration, drainage, or persistent bleeding.  Images permanently stored and available for review in the ultrasound unit.  Impression: Technically successful ultrasound guided injection.   Impression and Recommendations:     This case required medical decision making of moderate complexity. The above documentation has been reviewed and is accurate and complete Lyndal Pulley, DO       Note: This dictation was prepared with Dragon dictation along with smaller phrase technology. Any transcriptional errors that result from this process are unintentional.

## 2018-12-15 ENCOUNTER — Ambulatory Visit: Payer: Self-pay

## 2018-12-15 ENCOUNTER — Encounter: Payer: Self-pay | Admitting: Family Medicine

## 2018-12-15 ENCOUNTER — Other Ambulatory Visit: Payer: Self-pay

## 2018-12-15 ENCOUNTER — Ambulatory Visit (INDEPENDENT_AMBULATORY_CARE_PROVIDER_SITE_OTHER): Payer: Managed Care, Other (non HMO) | Admitting: Family Medicine

## 2018-12-15 VITALS — BP 102/60 | HR 79 | Ht 62.0 in

## 2018-12-15 DIAGNOSIS — M79641 Pain in right hand: Secondary | ICD-10-CM | POA: Diagnosis not present

## 2018-12-15 DIAGNOSIS — M67441 Ganglion, right hand: Secondary | ICD-10-CM | POA: Diagnosis not present

## 2018-12-15 NOTE — Assessment & Plan Note (Addendum)
Aspiration done today after evaluation with diagnostic ultrasound that showed that this was a possibility..  Tolerated the procedure well, discussed icing regimen and home exercise, discussed which activities to do which wants to avoid.  Patient is to increase activity slowly over the course the next several weeks.  Discussed bracing at night.  Worsening symptoms will need advanced imaging but I believe patient will do relatively well.

## 2018-12-15 NOTE — Patient Instructions (Addendum)
Brace at night Good to see you.  Ice 20 minutes 2 times daily. Usually after activity and before bed. Volatren gel 2x a day See me again in 5-6 weeks

## 2018-12-29 ENCOUNTER — Other Ambulatory Visit (INDEPENDENT_AMBULATORY_CARE_PROVIDER_SITE_OTHER): Payer: Managed Care, Other (non HMO)

## 2018-12-29 ENCOUNTER — Ambulatory Visit (INDEPENDENT_AMBULATORY_CARE_PROVIDER_SITE_OTHER): Payer: Managed Care, Other (non HMO) | Admitting: Internal Medicine

## 2018-12-29 ENCOUNTER — Other Ambulatory Visit: Payer: Self-pay

## 2018-12-29 ENCOUNTER — Encounter: Payer: Self-pay | Admitting: Internal Medicine

## 2018-12-29 VITALS — BP 126/76 | HR 66 | Temp 98.5°F | Ht 62.0 in | Wt 115.0 lb

## 2018-12-29 DIAGNOSIS — F17200 Nicotine dependence, unspecified, uncomplicated: Secondary | ICD-10-CM

## 2018-12-29 DIAGNOSIS — Z23 Encounter for immunization: Secondary | ICD-10-CM | POA: Diagnosis not present

## 2018-12-29 DIAGNOSIS — J069 Acute upper respiratory infection, unspecified: Secondary | ICD-10-CM

## 2018-12-29 DIAGNOSIS — Z0000 Encounter for general adult medical examination without abnormal findings: Secondary | ICD-10-CM | POA: Diagnosis not present

## 2018-12-29 LAB — HEPATIC FUNCTION PANEL
ALT: 13 U/L (ref 0–35)
AST: 15 U/L (ref 0–37)
Albumin: 4.2 g/dL (ref 3.5–5.2)
Alkaline Phosphatase: 80 U/L (ref 39–117)
Bilirubin, Direct: 0 mg/dL (ref 0.0–0.3)
Total Bilirubin: 0.3 mg/dL (ref 0.2–1.2)
Total Protein: 6.4 g/dL (ref 6.0–8.3)

## 2018-12-29 LAB — URINALYSIS
Bilirubin Urine: NEGATIVE
Ketones, ur: NEGATIVE
Leukocytes,Ua: NEGATIVE
Nitrite: NEGATIVE
Specific Gravity, Urine: >=1.03 — AB (ref 1.000–1.030)
Total Protein, Urine: NEGATIVE
Urine Glucose: NEGATIVE
Urobilinogen, UA: 0.2 (ref 0.0–1.0)
pH: 5.5 (ref 5.0–8.0)

## 2018-12-29 LAB — CBC WITH DIFFERENTIAL/PLATELET
Basophils Absolute: 0 10*3/uL (ref 0.0–0.1)
Basophils Relative: 0.4 % (ref 0.0–3.0)
Eosinophils Absolute: 0.1 10*3/uL (ref 0.0–0.7)
Eosinophils Relative: 1 % (ref 0.0–5.0)
HCT: 43.3 % (ref 36.0–46.0)
Hemoglobin: 14.5 g/dL (ref 12.0–15.0)
Lymphocytes Relative: 22.4 % (ref 12.0–46.0)
Lymphs Abs: 1.9 10*3/uL (ref 0.7–4.0)
MCHC: 33.5 g/dL (ref 30.0–36.0)
MCV: 98 fl (ref 78.0–100.0)
Monocytes Absolute: 0.5 10*3/uL (ref 0.1–1.0)
Monocytes Relative: 5.5 % (ref 3.0–12.0)
Neutro Abs: 5.9 10*3/uL (ref 1.4–7.7)
Neutrophils Relative %: 70.7 % (ref 43.0–77.0)
Platelets: 171 10*3/uL (ref 150.0–400.0)
RBC: 4.41 Mil/uL (ref 3.87–5.11)
RDW: 12.8 % (ref 11.5–15.5)
WBC: 8.3 10*3/uL (ref 4.0–10.5)

## 2018-12-29 LAB — BASIC METABOLIC PANEL
BUN: 18 mg/dL (ref 6–23)
CO2: 27 mEq/L (ref 19–32)
Calcium: 8.9 mg/dL (ref 8.4–10.5)
Chloride: 108 mEq/L (ref 96–112)
Creatinine, Ser: 0.65 mg/dL (ref 0.40–1.20)
GFR: 93.55 mL/min (ref >60.00)
Glucose, Bld: 114 mg/dL — ABNORMAL HIGH (ref 70–99)
Potassium: 3.4 mEq/L — ABNORMAL LOW (ref 3.5–5.1)
Sodium: 142 mEq/L (ref 135–145)

## 2018-12-29 LAB — LIPID PANEL
Cholesterol: 145 mg/dL (ref 0–200)
HDL: 47.7 mg/dL (ref >39.00)
LDL Cholesterol: 85 mg/dL (ref 0–99)
NonHDL: 97.02
Total CHOL/HDL Ratio: 3
Triglycerides: 62 mg/dL (ref 0.0–149.0)
VLDL: 12.4 mg/dL (ref 0.0–40.0)

## 2018-12-29 LAB — TSH: TSH: 2.64 u[IU]/mL (ref 0.35–4.50)

## 2018-12-29 MED ORDER — VITAMIN D3 50 MCG (2000 UT) PO CAPS: 2000.0000 [IU] | ORAL_CAPSULE | Freq: Every day | ORAL | 3 refills | Status: AC

## 2018-12-29 NOTE — Addendum Note (Signed)
Addended by: Karren Cobble on: 12/29/2018 11:17 AM   Modules accepted: Orders

## 2018-12-29 NOTE — Progress Notes (Signed)
Subjective:  Patient ID: Shelly Green, female    DOB: May 13, 1960  Age: 58 y.o. MRN: [id number]  CC: No chief complaint on file.   HPI MAKENLEY SCHWIETERMAN presents for a well exam C/o chronic LBP Had COVID in 09/2018 on vacation  Outpatient Medications Prior to Visit  Medication Sig Dispense Refill  . amphetamine-dextroamphetamine (ADDERALL) 20 MG tablet Take 20 mg by mouth 2 (two) times daily.   0  . busPIRone (BUSPAR) 7.5 MG tablet TAKE 2 TABLETS BY MOUTH TWICE A DAY 360 tablet 1  . butalbital-acetaminophen-caffeine (FIORICET) 50-325-40 MG tablet Take 2 tablets by mouth every 6 (six) hours as needed. 40 tablet 1  . darifenacin (ENABLEX) 15 MG 24 hr tablet Take 1 tablet (15 mg total) by mouth daily. 30 tablet 11  . diphenhydrAMINE (BENADRYL) 25 mg capsule Take 1 capsule (25 mg total) by mouth every 6 (six) hours as needed for itching or sleep. 10 capsule 0  . loratadine (CLARITIN) 10 MG tablet Take 10 mg by mouth daily.    . mirabegron ER (MYRBETRIQ) 50 MG TB24 tablet Take 1 tablet (50 mg total) by mouth daily. 30 tablet 11  . Multiple Vitamin (MULTIVITAMIN WITH MINERALS) TABS tablet Take 1 tablet by mouth 2 (two) times daily.    Marland Kitchen saccharomyces boulardii (FLORASTOR) 250 MG capsule Take 1 capsule (250 mg total) by mouth 2 (two) times daily. 30 capsule 0  . SUMAtriptan (IMITREX) 100 MG tablet Take 1 tablet (100 mg total) by mouth daily as needed for migraine. May repeat in 2 hours if headache persists or recurs. 12 tablet 3  . TRINTELLIX 20 MG TABS Take 20 mg by mouth every morning.  2  . clonazePAM (KLONOPIN) 0.5 MG tablet Take 0.25-0.5 mg by mouth 2 (two) times daily.  1  . dexamethasone (DECADRON) 2 MG tablet Take 10 mg by mouth once. Take 10mg  by mouth on the day BEFORE chemo    . diphenhydrAMINE-zinc acetate (BENADRYL) cream Apply topically 3 (three) times daily as needed for itching. 28.4 g 0  . pantoprazole (PROTONIX) 40 MG tablet Take 1 tablet (40 mg total) by mouth daily. Overdue  for annual appt must see MD for refills 30 tablet 0   No facility-administered medications prior to visit.     ROS: Review of Systems  Constitutional: Negative for activity change, appetite change, chills, fatigue and unexpected weight change.  HENT: Negative for congestion, mouth sores and sinus pressure.   Eyes: Negative for visual disturbance.  Respiratory: Negative for cough and chest tightness.   Gastrointestinal: Negative for abdominal pain and nausea.  Genitourinary: Negative for difficulty urinating, frequency and vaginal pain.  Musculoskeletal: Positive for back pain. Negative for gait problem.  Skin: Negative for pallor and rash.  Neurological: Negative for dizziness, tremors, weakness, numbness and headaches.  Psychiatric/Behavioral: Negative for confusion and sleep disturbance.    Objective:  BP 126/76 (BP Location: Right Arm, Patient Position: Sitting, Cuff Size: Normal)   Pulse 66   Temp 98.5 F (36.9 C) (Oral)   Ht 5\' 2"  (1.575 m)   Wt 115 lb (52.2 kg)   SpO2 96%   BMI 21.03 kg/m   BP Readings from Last 3 Encounters:  12/29/18 126/76  12/15/18 102/60  12/03/18 136/84    Wt Readings from Last 3 Encounters:  12/29/18 115 lb (52.2 kg)  12/03/18 115 lb (52.2 kg)  03/07/17 138 lb (62.6 kg)    Physical Exam Constitutional:      General: She  is not in acute distress.    Appearance: She is well-developed.  HENT:     Head: Normocephalic.     Right Ear: External ear normal.     Left Ear: External ear normal.     Nose: Nose normal.  Eyes:     General:        Right eye: No discharge.        Left eye: No discharge.     Conjunctiva/sclera: Conjunctivae normal.     Pupils: Pupils are equal, round, and reactive to light.  Neck:     Musculoskeletal: Normal range of motion and neck supple.     Thyroid: No thyromegaly.     Vascular: No JVD.     Trachea: No tracheal deviation.  Cardiovascular:     Rate and Rhythm: Normal rate and regular rhythm.     Heart  sounds: Normal heart sounds.  Pulmonary:     Effort: No respiratory distress.     Breath sounds: No stridor. No wheezing.  Abdominal:     General: Bowel sounds are normal. There is no distension.     Palpations: Abdomen is soft. There is no mass.     Tenderness: There is no abdominal tenderness. There is no guarding or rebound.  Musculoskeletal:        General: Tenderness present.  Lymphadenopathy:     Cervical: No cervical adenopathy.  Skin:    Findings: No erythema or rash.  Neurological:     Mental Status: She is oriented to person, place, and time.     Cranial Nerves: No cranial nerve deficit.     Motor: No abnormal muscle tone.     Coordination: Coordination normal.     Deep Tendon Reflexes: Reflexes normal.  Psychiatric:        Behavior: Behavior normal.        Thought Content: Thought content normal.        Judgment: Judgment normal.   LS sensitive w/ROM  Lab Results  Component Value Date   WBC 8.3 03/07/2017   HGB 14.2 03/07/2017   HCT 41.4 03/07/2017   PLT 173.0 03/07/2017   GLUCOSE 100 (H) 03/07/2017   CHOL 186 05/14/2013   TRIG 199.0 (H) 05/14/2013   HDL 37.30 (L) 05/14/2013   LDLCALC 109 (H) 05/14/2013   ALT 14 03/07/2017   AST 17 03/07/2017   NA 144 03/07/2017   K 3.9 03/07/2017   CL 107 03/07/2017   CREATININE 0.88 03/07/2017   BUN 20 03/07/2017   CO2 29 03/07/2017   TSH 1.32 03/07/2017   INR 1.02 09/28/2014    No results found.  Assessment & Plan:   There are no diagnoses linked to this encounter.   No orders of the defined types were placed in this encounter.    Follow-up: No follow-ups on file.  Walker Kehr, MD

## 2018-12-29 NOTE — Assessment & Plan Note (Addendum)
  We discussed age appropriate health related issues, including available/recomended screening tests and vaccinations. We discussed a need for adhering to healthy diet and exercise. Labs were ordered to be later reviewed . All questions were answered. Had [place] in 09/2018 on vacation TDAP, flu shot A cardiac CT scan for calcium scoring offered

## 2018-12-29 NOTE — Patient Instructions (Signed)

## 2018-12-30 LAB — SAR COV2 SEROLOGY (COVID19)AB(IGG),IA: SARS CoV2 AB IGG: NEGATIVE

## 2018-12-31 NOTE — Addendum Note (Signed)
Addended by: Karren Cobble on: 12/31/2018 04:20 PM   Modules accepted: Orders

## 2019-01-19 ENCOUNTER — Ambulatory Visit: Payer: Managed Care, Other (non HMO) | Admitting: Family Medicine

## 2019-01-19 ENCOUNTER — Other Ambulatory Visit: Payer: Self-pay

## 2019-01-19 ENCOUNTER — Encounter: Payer: Self-pay | Admitting: Family Medicine

## 2019-01-19 DIAGNOSIS — M999 Biomechanical lesion, unspecified: Secondary | ICD-10-CM | POA: Insufficient documentation

## 2019-01-19 DIAGNOSIS — M545 Low back pain: Secondary | ICD-10-CM

## 2019-01-19 DIAGNOSIS — G8929 Other chronic pain: Secondary | ICD-10-CM

## 2019-01-19 NOTE — Patient Instructions (Signed)
Happy New Year! Moving is better than not Tried manipulation See me again in 4 weeks

## 2019-01-19 NOTE — Assessment & Plan Note (Signed)
Decision today to treat with OMT was based on Physical Exam  After verbal consent patient was treated with HVLA, ME, FPR techniques in  thoracic, lumbar and sacral areas  Patient tolerated the procedure well with improvement in symptoms  Patient given exercises, stretches and lifestyle modifications  See medications in patient instructions if given  Patient will follow up in 4-8 weeks 

## 2019-01-19 NOTE — Assessment & Plan Note (Signed)
Patient has chronic low back pain this seems to be more secondary to the sacroiliac joint.  Discussed which activities to do which wants to avoid.  Patient is to increase activity as tolerated.  Patient responded well to osteopathic manipulation.  Topical anti-inflammatories.  Follow-up again in 4 to 8 weeks

## 2019-01-19 NOTE — Progress Notes (Signed)
[facility] [address] [place], [place] [address] Phone: [phone] Subjective:   Shelly Green, am serving as a scribe for Dr. Hulan Saas. This visit occurred during the SARS-CoV-2 public health emergency.  Safety protocols were in place, including screening questions prior to the visit, additional usage of staff PPE, and extensive cleaning of exam room while observing appropriate contact time as indicated for disinfecting solutions.    CC: finger cyst follow up   [id number]   12/15/2018 Aspiration done today after evaluation with diagnostic ultrasound that showed that this was a possibility..  Tolerated the procedure well, discussed icing regimen and home exercise, discussed which activities to do which wants to avoid.  Patient is to increase activity slowly over the course the next several weeks.  Discussed bracing at night.  Worsening symptoms will need advanced imaging but I believe patient will do relatively well.  Update 01/19/2019 Shelly Green is a 58 y.o. female coming in with complaint of right hand pain. Patient states  That she is not having any pain and the cyst has not come back.   Is also having right sided lower back pain for a few months. Pain occurs daily. Patient stands at work and this makes her pain worse. Denies any radiating symptoms. Take IBU prn.   DG Lumbar Spine 09/24/2013 1. Mild degenerative changes in the lower lumbar spine at L4-5 and L5-S1. 2. Green acute abnormality.    Past Medical History:  Diagnosis Date  . Arthritis   . Cancer [facility])    breast   . History of colon polyps   . IBS (irritable bowel syndrome)   . Menopausal disorder   . Migraines   . Seasonal allergies   . Social anxiety disorder    Past Surgical History:  Procedure Laterality Date  . CARPAL TUNNEL RELEASE  08/20/2011   Procedure: CARPAL TUNNEL RELEASE;  Surgeon: Cammie Sickle., MD;  Location: [facility];  Service:  Orthopedics;  Laterality: Right;  . HEMORROIDECTOMY    . LAPAROSCOPIC CHOLECYSTECTOMY  2007  . TOTAL ABDOMINAL HYSTERECTOMY     Social History   Socioeconomic History  . Marital status: Married    Spouse name: Not on file  . Number of children: Not on file  . Years of education: Not on file  . Highest education level: Not on file  Occupational History  . Not on file  Tobacco Use  . Smoking status: Former Smoker    Packs/day: 1.50    Years: 35.00    Pack years: 52.50    Types: Cigarettes    Quit date: 01/24/2017    Years since quitting: 1.9  . Smokeless tobacco: Never Used  Substance and Sexual Activity  . Alcohol use: Green    Alcohol/week: 0.0 standard drinks  . Drug use: Green  . Sexual activity: Yes  Other Topics Concern  . Not on file  Social History Narrative  . Not on file   Social Determinants of Health   Financial Resource Strain:   . Difficulty of Paying Living Expenses: Not on file  Food Insecurity:   . Worried About Charity fundraiser in the Last Year: Not on file  . Ran Out of Food in the Last Year: Not on file  Transportation Needs:   . Lack of Transportation (Medical): Not on file  . Lack of Transportation (Non-Medical): Not on file  Physical Activity:   . Days of Exercise per Week:  Not on file  . Minutes of Exercise per Session: Not on file  Stress:   . Feeling of Stress : Not on file  Social Connections:   . Frequency of Communication with Friends and Family: Not on file  . Frequency of Social Gatherings with Friends and Family: Not on file  . Attends Religious Services: Not on file  . Active Member of Clubs or Organizations: Not on file  . Attends Archivist Meetings: Not on file  . Marital Status: Not on file   Allergies  Allergen Reactions  . Codeine Anaphylaxis  . Hydrocodone Shortness Of Breath  . Penicillins Anaphylaxis    As a child Has patient had a PCN reaction causing immediate rash, facial/tongue/throat swelling, SOB or  lightheadedness with hypotension: yes Has patient had a PCN reaction causing severe rash involving mucus membranes or skin necrosis: unknown Has patient had a PCN reaction that required hospitalization: yes Has patient had a PCN reaction occurring within the last 10 years: Green If all of the above answers are "Green", then may proceed with Cephalosporin use.   . Sulfa Antibiotics Anaphylaxis and Rash  . Tramadol Shortness Of Breath  . Clindamycin/Lincomycin     rush   Family History  Problem Relation Age of Onset  . Arthritis Mother   . Lymphoma Mother   . Colon cancer Sister 54  . Mental illness Daughter   . Mental illness Maternal Aunt   . Arthritis Maternal Grandmother   . Hypertension Maternal Grandfather       Current Outpatient Medications (Respiratory):  .  diphenhydrAMINE (BENADRYL) 25 mg capsule, Take 1 capsule (25 mg total) by mouth every 6 (six) hours as needed for itching or sleep. Marland Kitchen  loratadine (CLARITIN) 10 MG tablet, Take 10 mg by mouth daily.  Current Outpatient Medications (Analgesics):  .  butalbital-acetaminophen-caffeine (FIORICET) 50-325-40 MG tablet, Take 2 tablets by mouth every 6 (six) hours as needed. .  SUMAtriptan (IMITREX) 100 MG tablet, Take 1 tablet (100 mg total) by mouth daily as needed for migraine. May repeat in 2 hours if headache persists or recurs.   Current Outpatient Medications (Other):  .  amphetamine-dextroamphetamine (ADDERALL) 20 MG tablet, Take 20 mg by mouth 2 (two) times daily.  .  busPIRone (BUSPAR) 7.5 MG tablet, TAKE 2 TABLETS BY MOUTH TWICE A DAY .  Cholecalciferol (VITAMIN D3) 50 MCG (2000 UT) capsule, Take 1 capsule (2,000 Units total) by mouth daily. Marland Kitchen  darifenacin (ENABLEX) 15 MG 24 hr tablet, Take 1 tablet (15 mg total) by mouth daily. .  mirabegron ER (MYRBETRIQ) 50 MG TB24 tablet, Take 1 tablet (50 mg total) by mouth daily. .  Multiple Vitamin (MULTIVITAMIN WITH MINERALS) TABS tablet, Take 1 tablet by mouth 2 (two) times  daily. Marland Kitchen  saccharomyces boulardii (FLORASTOR) 250 MG capsule, Take 1 capsule (250 mg total) by mouth 2 (two) times daily. .  TRINTELLIX 20 MG TABS, Take 20 mg by mouth every morning.    Past medical history, social, surgical and family history all reviewed in electronic medical record.  Green pertanent information unless stated regarding to the chief complaint.   Review of Systems:  Green headache, visual changes, nausea, vomiting, diarrhea, constipation, dizziness, abdominal pain, skin rash, fevers, chills, night sweats, weight loss, swollen lymph nodes, body aches, joint swelling, muscle aches, chest pain, shortness of breath, mood changes.   Objective  Blood pressure 110/78, pulse 80, height 5\' 2"  (1.575 m), weight 115 lb (52.2 kg), SpO2 97 %.  General: Green apparent distress alert and oriented x3 mood and affect normal, dressed appropriately.  HEENT: Pupils equal, extraocular movements intact  Respiratory: Patient's speak in full sentences and does not appear short of breath  Cardiovascular: Green lower extremity edema, non tender, Green erythema  Skin: Warm dry intact with Green signs of infection or rash on extremities or on axial skeleton.  Abdomen: Soft nontender  Neuro: Cranial nerves II through XII are intact, neurovascularly intact in all extremities with 2+ DTRs and 2+ pulses.  Lymph: Green lymphadenopathy of posterior or anterior cervical chain or axillae bilaterally.  Gait normal with good balance and coordination.  MSK:  Non tender with full range of motion and good stability and symmetric strength and tone of shoulders, elbows, wrist, hip, knee and ankles bilaterally.  Low back exam shows the patient does have tenderness to palpation over the right sacroiliac joint.  Patient does have mild positive Corky Sox.  Negative straight leg test.  Very minimal degenerative scoliosis of the lumbar noted.  5-5 strength.  Osteopathic findings  T9 extended rotated and side bent left L2 flexed rotated and  side bent right Sacrum right on right  [id number]; 15 additional minutes spent for Therapeutic exercises as stated in above notes.  This included exercises focusing on stretching, strengthening, with significant focus on eccentric aspects.   Long term goals include an improvement in range of motion, strength, endurance as well as avoiding reinjury. Patient's frequency would include in 1-2 times a day, 3-5 times a week for a duration of 6-12 weeks. Low back exercises that included:  Pelvic tilt/bracing instruction to focus on control of the pelvic girdle and lower abdominal muscles  Glute strengthening exercises, focusing on proper firing of the glutes without engaging the low back muscles Proper stretching techniques for maximum relief for the hamstrings, hip flexors, low back and some rotation where tolerated   Proper technique shown and discussed handout in great detail with ATC.  All questions were discussed and answered.      Impression and Recommendations:     This case required medical decision making of moderate complexity. The above documentation has been reviewed and is accurate and complete Lyndal Pulley, DO       Note: This dictation was prepared with Dragon dictation along with smaller phrase technology. Any transcriptional errors that result from this process are unintentional.

## 2019-01-20 ENCOUNTER — Ambulatory Visit (INDEPENDENT_AMBULATORY_CARE_PROVIDER_SITE_OTHER)
Admission: RE | Admit: 2019-01-20 | Discharge: 2019-01-20 | Disposition: A | Discharge status: Home / Self Care | Payer: Self-pay | Source: Ambulatory Visit | Attending: Internal Medicine | Admitting: Internal Medicine

## 2019-01-20 DIAGNOSIS — F17200 Nicotine dependence, unspecified, uncomplicated: Secondary | ICD-10-CM

## 2019-01-25 ENCOUNTER — Encounter: Payer: Self-pay | Admitting: Family Medicine

## 2019-02-10 ENCOUNTER — Encounter: Payer: Self-pay | Admitting: Internal Medicine

## 2019-02-10 ENCOUNTER — Ambulatory Visit: Payer: Managed Care, Other (non HMO) | Admitting: Internal Medicine

## 2019-02-10 ENCOUNTER — Other Ambulatory Visit: Payer: Self-pay

## 2019-02-10 DIAGNOSIS — F419 Anxiety disorder, unspecified: Secondary | ICD-10-CM | POA: Diagnosis not present

## 2019-02-10 DIAGNOSIS — F988 Other specified behavioral and emotional disorders with onset usually occurring in childhood and adolescence: Secondary | ICD-10-CM

## 2019-02-10 DIAGNOSIS — Q782 Osteopetrosis: Secondary | ICD-10-CM | POA: Diagnosis not present

## 2019-02-10 MED ORDER — AMPHETAMINE-DEXTROAMPHETAMINE 20 MG PO TABS: 20.0000 mg | ORAL_TABLET | Freq: Two times a day (BID) | ORAL | 0 refills | Status: DC

## 2019-02-10 MED ORDER — BUSPIRONE HCL 7.5 MG PO TABS: 15.0000 mg | ORAL_TABLET | Freq: Two times a day (BID) | ORAL | 1 refills | Status: DC

## 2019-02-10 NOTE — Progress Notes (Signed)
Subjective:  Patient ID: Shelly Green, female    DOB: 07-14-1960  Age: 59 y.o. MRN: [id number]  CC: No chief complaint on file.   HPI KLIYAH SHERIN presents for ADD, wt loss anxiety f/u F/u osteoporosis, worse - she is starting Boniva  Outpatient Medications Prior to Visit  Medication Sig Dispense Refill  . amphetamine-dextroamphetamine (ADDERALL) 20 MG tablet Take 20 mg by mouth 2 (two) times daily.   0  . busPIRone (BUSPAR) 7.5 MG tablet TAKE 2 TABLETS BY MOUTH TWICE A DAY 360 tablet 1  . butalbital-acetaminophen-caffeine (FIORICET) 50-325-40 MG tablet Take 2 tablets by mouth every 6 (six) hours as needed. 40 tablet 1  . Cholecalciferol (VITAMIN D3) 50 MCG (2000 UT) capsule Take 1 capsule (2,000 Units total) by mouth daily. 100 capsule 3  . darifenacin (ENABLEX) 15 MG 24 hr tablet Take 1 tablet (15 mg total) by mouth daily. 30 tablet 11  . diphenhydrAMINE (BENADRYL) 25 mg capsule Take 1 capsule (25 mg total) by mouth every 6 (six) hours as needed for itching or sleep. 10 capsule 0  . loratadine (CLARITIN) 10 MG tablet Take 10 mg by mouth daily.    . mirabegron ER (MYRBETRIQ) 50 MG TB24 tablet Take 1 tablet (50 mg total) by mouth daily. 30 tablet 11  . Multiple Vitamin (MULTIVITAMIN WITH MINERALS) TABS tablet Take 1 tablet by mouth 2 (two) times daily.    Marland Kitchen saccharomyces boulardii (FLORASTOR) 250 MG capsule Take 1 capsule (250 mg total) by mouth 2 (two) times daily. 30 capsule 0  . SUMAtriptan (IMITREX) 100 MG tablet Take 1 tablet (100 mg total) by mouth daily as needed for migraine. May repeat in 2 hours if headache persists or recurs. 12 tablet 3  . TRINTELLIX 20 MG TABS Take 20 mg by mouth every morning.  2   No facility-administered medications prior to visit.    ROS: Review of Systems  Constitutional: Negative for activity change, appetite change, chills, fatigue and unexpected weight change.  HENT: Negative for congestion, mouth sores and sinus pressure.   Eyes:  Negative for visual disturbance.  Respiratory: Negative for cough and chest tightness.   Gastrointestinal: Negative for abdominal pain and nausea.  Genitourinary: Negative for difficulty urinating, frequency and vaginal pain.  Musculoskeletal: Negative for back pain and gait problem.  Skin: Negative for pallor and rash.  Neurological: Negative for dizziness, tremors, weakness, numbness and headaches.  Psychiatric/Behavioral: Negative for confusion and sleep disturbance.    Objective:  BP 114/82 (BP Location: Right Arm, Patient Position: Sitting, Cuff Size: Normal)   Pulse 78   Temp 98.3 F (36.8 C) (Oral)   Ht 5\' 2"  (1.575 m)   Wt 118 lb (53.5 kg)   SpO2 95%   BMI 21.58 kg/m   BP Readings from Last 3 Encounters:  02/10/19 114/82  01/19/19 110/78  12/29/18 126/76    Wt Readings from Last 3 Encounters:  02/10/19 118 lb (53.5 kg)  01/19/19 115 lb (52.2 kg)  12/29/18 115 lb (52.2 kg)    Physical Exam Constitutional:      General: She is not in acute distress.    Appearance: She is well-developed.  HENT:     Head: Normocephalic.     Right Ear: External ear normal.     Left Ear: External ear normal.     Nose: Nose normal.  Eyes:     General:        Right eye: No discharge.  Left eye: No discharge.     Conjunctiva/sclera: Conjunctivae normal.     Pupils: Pupils are equal, round, and reactive to light.  Neck:     Thyroid: No thyromegaly.     Vascular: No JVD.     Trachea: No tracheal deviation.  Cardiovascular:     Rate and Rhythm: Normal rate and regular rhythm.     Heart sounds: Normal heart sounds.  Pulmonary:     Effort: No respiratory distress.     Breath sounds: No stridor. No wheezing.  Abdominal:     General: Bowel sounds are normal. There is no distension.     Palpations: Abdomen is soft. There is no mass.     Tenderness: There is no abdominal tenderness. There is no guarding or rebound.  Musculoskeletal:        General: No tenderness.      Cervical back: Normal range of motion and neck supple.  Lymphadenopathy:     Cervical: No cervical adenopathy.  Skin:    Findings: No erythema or rash.  Neurological:     Cranial Nerves: No cranial nerve deficit.     Motor: No abnormal muscle tone.     Coordination: Coordination normal.     Deep Tendon Reflexes: Reflexes normal.  Psychiatric:        Behavior: Behavior normal.        Thought Content: Thought content normal.        Judgment: Judgment normal.   thin   Lab Results  Component Value Date   WBC 8.3 12/29/2018   HGB 14.5 12/29/2018   HCT 43.3 12/29/2018   PLT 171.0 12/29/2018   GLUCOSE 114 (H) 12/29/2018   CHOL 145 12/29/2018   TRIG 62.0 12/29/2018   HDL 47.70 12/29/2018   LDLCALC 85 12/29/2018   ALT 13 12/29/2018   AST 15 12/29/2018   NA 142 12/29/2018   K 3.4 (L) 12/29/2018   CL 108 12/29/2018   CREATININE 0.65 12/29/2018   BUN 18 12/29/2018   CO2 27 12/29/2018   TSH 2.64 12/29/2018   INR 1.02 09/28/2014    CT CARDIAC SCORING  Addendum Date: 01/20/2019   ADDENDUM REPORT: 01/20/2019 08:46 CLINICAL DATA:  Risk stratification EXAM: Coronary Calcium Score TECHNIQUE: The patient was scanned on a [employer] scanner. Axial non-contrast 3 mm slices were carried out through the heart. The data set was analyzed on a dedicated work station and scored using the [place]. FINDINGS: Non-cardiac: See separate report from [facility]. Ascending Aorta: Normal caliber. Pericardium: Normal. Coronary arteries: Normal origins. IMPRESSION: Coronary calcium score of 0. Eleonore Chiquito, MD Electronically Signed   By: Eleonore Chiquito   On: 01/20/2019 08:46   Result Date: 01/20/2019 EXAM: OVER-READ INTERPRETATION  CT CHEST The following report is an over-read performed by radiologist Dr. Rolm Baptise of [facility], [place] on 01/20/2019. This over-read does not include interpretation of cardiac or coronary anatomy or pathology. The coronary calcium score  interpretation by the cardiologist is attached. COMPARISON:  03/31/2007. FINDINGS: Vascular: Heart is normal size.  Visualized aorta normal caliber. Mediastinum/Nodes: No adenopathy in the lower mediastinum or hila. Lungs/Pleura: Right middle lobe nodule measures 4 mm on image 14. This is stable since 2009 compatible with benign nodule. Lingular nodule on the same image measures 2-3 mm and is stable since 2009. No confluent opacities or effusions. Upper Abdomen: Imaging into the upper abdomen shows no acute findings. Musculoskeletal: Bilateral breast implants. Chest wall soft tissues are unremarkable otherwise. No acute  bony abnormality. IMPRESSION: Small bilateral pulmonary nodules are stable since 2009 compatible with benign nodules. No acute extracardiac findings or significant abnormality. Electronically Signed: By: Rolm Baptise M.D. On: 01/20/2019 08:43    Assessment & Plan:    Follow-up: No follow-ups on file.  Walker Kehr, MD

## 2019-02-10 NOTE — Assessment & Plan Note (Signed)
Cont Buspar Trintellix

## 2019-02-10 NOTE — Assessment & Plan Note (Addendum)
Worse. Starting Boniva  Potential benefits of a long term Boniva use as well as potential risks  and complications were explained to the patient and were aknowledged.

## 2019-02-10 NOTE — Assessment & Plan Note (Signed)
I will have to take over Adderall Rx  Potential benefits of a long term amphetamines  use as well as potential risks  and complications were explained to the patient and were aknowledged.

## 2019-02-16 ENCOUNTER — Other Ambulatory Visit: Payer: Self-pay

## 2019-02-16 ENCOUNTER — Ambulatory Visit: Payer: Managed Care, Other (non HMO) | Attending: Internal Medicine

## 2019-02-16 DIAGNOSIS — Z20822 Contact with and (suspected) exposure to covid-19: Secondary | ICD-10-CM

## 2019-02-17 LAB — NOVEL CORONAVIRUS, NAA: SARS-CoV-2, NAA: NOT DETECTED

## 2019-02-18 ENCOUNTER — Ambulatory Visit: Payer: Managed Care, Other (non HMO) | Admitting: Family Medicine

## 2019-02-22 ENCOUNTER — Telehealth: Payer: Self-pay | Admitting: Family

## 2019-02-22 ENCOUNTER — Ambulatory Visit (INDEPENDENT_AMBULATORY_CARE_PROVIDER_SITE_OTHER): Payer: Managed Care, Other (non HMO) | Admitting: Family

## 2019-02-22 DIAGNOSIS — J069 Acute upper respiratory infection, unspecified: Secondary | ICD-10-CM

## 2019-02-22 MED ORDER — FLUTICASONE PROPIONATE 50 MCG/ACT NA SUSP: 2.0000 | Freq: Every day | NASAL | 6 refills | Status: DC

## 2019-02-22 MED ORDER — BENZONATATE 200 MG PO CAPS: 200.0000 mg | ORAL_CAPSULE | Freq: Three times a day (TID) | ORAL | 0 refills | Status: DC | PRN

## 2019-02-22 NOTE — Telephone Encounter (Signed)
I am not sure what she is asking.  She was here a week ago and received 3 prescriptions for regular Adderall for 3 months.  Thanks

## 2019-02-22 NOTE — Telephone Encounter (Signed)
Dr. Alain Marion,  Patient is asking for the XR form of Adderall. I told her you would make a decision and let her know.  Thanks- Mickel Baas

## 2019-02-22 NOTE — Progress Notes (Signed)
Shelly Green is a 59 y.o. female with the following history as recorded in EpicCare:  Patient Active Problem List   Diagnosis Date Noted  . 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 Anxiety disorder 02/10/2019  . Nonallopathic lesion of sacral region 01/19/2019  . Nonallopathic lesion of lumbosacral region 01/19/2019  . Nonallopathic lesion of thoracic region 01/19/2019  . Well adult exam 12/29/2018  . Ganglion cyst of joint of finger of right hand 12/15/2018  . Hand pain, right 12/03/2018  . ADD (attention deficit disorder) 12/03/2018  . Urinary incontinence 03/07/2017  . Abscess of right leg 12/25/2015  . Breast cancer ([place]) 12/25/2015  . Rash 12/25/2015  . Hypokalemia 12/25/2015  . Tobacco abuse 12/25/2015  . Cellulitis 12/25/2015  . Breast lump in female 08/23/2015  . Cough 08/23/2015  . Migraine headache with aura 04/05/2015  . IBS (irritable bowel syndrome) 11/15/2014  . Acute blood loss anemia 09/30/2014  . Spleen laceration 09/28/2014  . Splenic laceration   . Low libido 11/12/2013  . Abdominal pain 09/24/2013  . Chronic low back pain 09/24/2013  . Tobacco use disorder 09/24/2013  . Menopause syndrome 09/28/2012    Current Outpatient Medications  Medication Sig Dispense Refill  . amphetamine-dextroamphetamine (ADDERALL) 20 MG tablet Take 1 tablet (20 mg total) by mouth 2 (two) times daily. 30 tablet 0  . amphetamine-dextroamphetamine (ADDERALL) 20 MG tablet Take 1 tablet (20 mg total) by mouth 2 (two) times daily. 60 tablet 0  . amphetamine-dextroamphetamine (ADDERALL) 20 MG tablet Take 1 tablet (20 mg total) by mouth 2 (two) times daily. 60 tablet 0  . benzonatate (TESSALON) 200 MG capsule Take 1 capsule (200 mg total) by mouth 3 (three) times daily as needed for cough. 30 capsule 0  . busPIRone (BUSPAR) 7.5 MG tablet Take 2 tablets (15 mg total) by mouth 2 (two) times daily. 360 tablet 1  . butalbital-acetaminophen-caffeine (FIORICET) 50-325-40 MG tablet Take 2 tablets by  mouth every 6 (six) hours as needed. 40 tablet 1  . Cholecalciferol (VITAMIN D3) 50 MCG (2000 UT) capsule Take 1 capsule (2,000 Units total) by mouth daily. 100 capsule 3  . darifenacin (ENABLEX) 15 MG 24 hr tablet Take 1 tablet (15 mg total) by mouth daily. 30 tablet 11  . diphenhydrAMINE (BENADRYL) 25 mg capsule Take 1 capsule (25 mg total) by mouth every 6 (six) hours as needed for itching or sleep. 10 capsule 0  . fluticasone (FLONASE) 50 MCG/ACT nasal spray Place 2 sprays into both nostrils daily. 16 g 6  . loratadine (CLARITIN) 10 MG tablet Take 10 mg by mouth daily.    . mirabegron ER (MYRBETRIQ) 50 MG TB24 tablet Take 1 tablet (50 mg total) by mouth daily. 30 tablet 11  . Multiple Vitamin (MULTIVITAMIN WITH MINERALS) TABS tablet Take 1 tablet by mouth 2 (two) times daily.    Marland Kitchen saccharomyces boulardii (FLORASTOR) 250 MG capsule Take 1 capsule (250 mg total) by mouth 2 (two) times daily. 30 capsule 0  . SUMAtriptan (IMITREX) 100 MG tablet Take 1 tablet (100 mg total) by mouth daily as needed for migraine. May repeat in 2 hours if headache persists or recurs. 12 tablet 3  . TRINTELLIX 20 MG TABS Take 20 mg by mouth every morning.  2   No current facility-administered medications for this visit.    Allergies: Codeine, Hydrocodone, Penicillins, Sulfa antibiotics, Tramadol, and Clindamycin/lincomycin  Past Medical History:  Diagnosis Date  . Arthritis   . Cancer ([facility])    breast   .  History of colon polyps   . IBS (irritable bowel syndrome)   . Menopausal disorder   . Migraines   . Seasonal allergies   . Social anxiety disorder     Past Surgical History:  Procedure Laterality Date  . CARPAL TUNNEL RELEASE  08/20/2011   Procedure: CARPAL TUNNEL RELEASE;  Surgeon: Cammie Sickle., MD;  Location: [facility];  Service: Orthopedics;  Laterality: Right;  . HEMORROIDECTOMY    . LAPAROSCOPIC CHOLECYSTECTOMY  2007  . TOTAL ABDOMINAL HYSTERECTOMY      Family History   Problem Relation Age of Onset  . Arthritis Mother   . Lymphoma Mother   . Colon cancer Sister 49  . Mental illness Daughter   . Mental illness Maternal Aunt   . Arthritis Maternal Grandmother   . Hypertension Maternal Grandfather     Social History   Tobacco Use  . Smoking status: Former Smoker    Packs/day: 1.50    Years: 35.00    Pack years: 52.50    Types: Cigarettes    Quit date: 01/24/2017    Years since quitting: 2.0  . Smokeless tobacco: Never Used  Substance Use Topics  . Alcohol use: No    Alcohol/week: 0.0 standard drinks    Subjective:    I connected with Shelly Green on 02/22/19 at 11:00 AM EST by a video enabled telemedicine application and verified that I am speaking with the correct person using two identifiers.   I discussed the limitations of evaluation and management by telemedicine and the availability of in person appointments. The patient expressed understanding and agreed to proceed. Provider in office/ patient is at home; provider and patient are only 2 people on video call.   Cold symptoms x 1 week; has had a negative COVID test; family members are sick with similar symptoms- their COVID tests have been negative as well; + runny nose- "clear." Using daily Alavert but no other medications for symptom relief; no chest pain, shortness of breath or difficulty breathing;    Objective:  There were no vitals filed for this visit.  General: Well developed, well nourished, in no acute distress  Skin : Warm and dry.  Head: Normocephalic and atraumatic  Lungs: Respirations unlabored;  Neurologic: Alert and oriented; speech intact; face symmetrical;   Assessment:  1. Viral URI with cough     Plan:  Recent negative COVID test; will treat symptoms at this time- do not feel antibiotic needed; Rx for Flonase and Tessalon Perles; increase fluids, rest and follow-up worse, no better.   No follow-ups on file.  No orders of the defined types were placed in  this encounter.   Requested Prescriptions   Signed Prescriptions Disp Refills  . fluticasone (FLONASE) 50 MCG/ACT nasal spray 16 g 6    Sig: Place 2 sprays into both nostrils daily.  . benzonatate (TESSALON) 200 MG capsule 30 capsule 0    Sig: Take 1 capsule (200 mg total) by mouth 3 (three) times daily as needed for cough.

## 2019-02-25 ENCOUNTER — Ambulatory Visit: Payer: Managed Care, Other (non HMO) | Admitting: Family Medicine

## 2019-03-04 ENCOUNTER — Ambulatory Visit: Payer: Managed Care, Other (non HMO) | Admitting: Family Medicine

## 2019-03-12 ENCOUNTER — Telehealth: Payer: Self-pay

## 2019-03-12 NOTE — Telephone Encounter (Signed)
Medication Requested: amphetamine-dextroamphetamine (ADDERALL) 20 MG tablet  Is medication on med list:  Yes  (if no, inform pt they may need an appointment)  Is medication a controled: Yes  (yes = last OV with PCP)  -Controlled Substances: Adderall, Ritalin, oxycodone, hydrocodone, methadone, alprazolam, etc  Last visit with PCP:  1.20.2021  Is the OV > than 4 months: (yes = schedule an appt if one is not already made)  Pharmacy (Name, [facility]): [facility] [address]

## 2019-03-12 NOTE — Telephone Encounter (Signed)
3 months work of RXs sent at [facility] in January, [place] informing pt to contact pharmacy

## 2019-03-30 NOTE — Assessment & Plan Note (Signed)
Decision today to treat with OMT was based on Physical Exam  After verbal consent patient was treated with HVLA, ME, FPR techniques in cervical, thoracic, lumbar and sacral areas  Patient tolerated the procedure well with improvement in symptoms  Patient given exercises, stretches and lifestyle modifications  See medications in patient instructions if given  Patient will follow up in 4-8 weeks 

## 2019-03-30 NOTE — Assessment & Plan Note (Signed)
Chronic low back pain.  Patient is not a good candidate for opioids and due to patient's social determinants of health and other comorbidities I would like to avoid certain activities on a regular basis.  Discussed icing regimen with home exercises, which activities to do which wants to avoid.  Patient will increase activity slowly over the course the next several weeks.  Follow-up with me again in 4 to 8 weeks

## 2019-03-30 NOTE — Progress Notes (Signed)
[facility] Phone: [phone] Subjective:   Shelly Green, am serving as a scribe for Dr. Hulan Saas. This visit occurred during the SARS-CoV-2 public health emergency.  Safety protocols were in place, including screening questions prior to the visit, additional usage of staff PPE, and extensive cleaning of exam room while observing appropriate contact time as indicated for disinfecting solutions.   I'm seeing this patient by the request  of:  Plotnikov, Evie Lacks, MD  CC: Low back pain follow-up  [id number]  Shelly Green is a 59 y.o. female coming in with complaint of back pain. Last seen on 01/19/2019 for OMT. Patient states that she pinched something in her lower back last week. Pain with sitting or lying down. Injured herself picking something up at work. Denies any radiating symptoms. Uses IBU prn.  Patient feels like continues to have some discomfort and pain almost on a daily basis.  Never without some discomfort though. Patient though denies any significant radiation of pain down the legs.  States that she is able to increase activity very slowly.  Denies any fevers chills or any abnormal weight loss.   Last x-rays were in 2015 and patient has declined.  These were independently visualized by me showing mild degenerative changes of the lumbar spine at L4-L5  Past Medical History:  Diagnosis Date  . Arthritis   . Cancer [facility])    breast   . History of colon polyps   . IBS (irritable bowel syndrome)   . Menopausal disorder   . Migraines   . Seasonal allergies   . Social anxiety disorder    Past Surgical History:  Procedure Laterality Date  . CARPAL TUNNEL RELEASE  08/20/2011   Procedure: CARPAL TUNNEL RELEASE;  Surgeon: Cammie Sickle., MD;  Location: [facility];  Service: Orthopedics;  Laterality: Right;  . HEMORROIDECTOMY    . LAPAROSCOPIC CHOLECYSTECTOMY  2007  . TOTAL  ABDOMINAL HYSTERECTOMY     Social History   Socioeconomic History  . Marital status: Married    Spouse name: Not on file  . Number of children: Not on file  . Years of education: Not on file  . Highest education level: Not on file  Occupational History  . Not on file  Tobacco Use  . Smoking status: Former Smoker    Packs/day: 1.50    Years: 35.00    Pack years: 52.50    Types: Cigarettes    Quit date: 01/24/2017    Years since quitting: 2.1  . Smokeless tobacco: Never Used  Substance and Sexual Activity  . Alcohol use: Green    Alcohol/week: 0.0 standard drinks  . Drug use: Green  . Sexual activity: Yes  Other Topics Concern  . Not on file  Social History Narrative  . Not on file   Social Determinants of Health   Financial Resource Strain:   . Difficulty of Paying Living Expenses: Not on file  Food Insecurity:   . Worried About Charity fundraiser in the Last Year: Not on file  . Ran Out of Food in the Last Year: Not on file  Transportation Needs:   . Lack of Transportation (Medical): Not on file  . Lack of Transportation (Non-Medical): Not on file  Physical Activity:   . Days of Exercise per Week: Not on file  . Minutes of Exercise per Session: Not on file  Stress:   . Feeling  of Stress : Not on file  Social Connections:   . Frequency of Communication with Friends and Family: Not on file  . Frequency of Social Gatherings with Friends and Family: Not on file  . Attends Religious Services: Not on file  . Active Member of Clubs or Organizations: Not on file  . Attends Archivist Meetings: Not on file  . Marital Status: Not on file   Allergies  Allergen Reactions  . Codeine Anaphylaxis  . Hydrocodone Shortness Of Breath  . Penicillins Anaphylaxis    As a child Has patient had a PCN reaction causing immediate rash, facial/tongue/throat swelling, SOB or lightheadedness with hypotension: yes Has patient had a PCN reaction causing severe rash involving mucus  membranes or skin necrosis: unknown Has patient had a PCN reaction that required hospitalization: yes Has patient had a PCN reaction occurring within the last 10 years: Green If all of the above answers are "Green", then may proceed with Cephalosporin use.   . Sulfa Antibiotics Anaphylaxis and Rash  . Tramadol Shortness Of Breath  . Clindamycin/Lincomycin     rush   Family History  Problem Relation Age of Onset  . Arthritis Mother   . Lymphoma Mother   . Colon cancer Sister 41  . Mental illness Daughter   . Mental illness Maternal Aunt   . Arthritis Maternal Grandmother   . Hypertension Maternal Grandfather       Current Outpatient Medications (Respiratory):  .  benzonatate (TESSALON) 200 MG capsule, Take 1 capsule (200 mg total) by mouth 3 (three) times daily as needed for cough. .  diphenhydrAMINE (BENADRYL) 25 mg capsule, Take 1 capsule (25 mg total) by mouth every 6 (six) hours as needed for itching or sleep. .  fluticasone (FLONASE) 50 MCG/ACT nasal spray, Place 2 sprays into both nostrils daily. Marland Kitchen  loratadine (CLARITIN) 10 MG tablet, Take 10 mg by mouth daily.  Current Outpatient Medications (Analgesics):  .  butalbital-acetaminophen-caffeine (FIORICET) 50-325-40 MG tablet, Take 2 tablets by mouth every 6 (six) hours as needed. .  SUMAtriptan (IMITREX) 100 MG tablet, Take 1 tablet (100 mg total) by mouth daily as needed for migraine. May repeat in 2 hours if headache persists or recurs.   Current Outpatient Medications (Other):  .  busPIRone (BUSPAR) 7.5 MG tablet, Take 2 tablets (15 mg total) by mouth 2 (two) times daily. .  Cholecalciferol (VITAMIN D3) 50 MCG (2000 UT) capsule, Take 1 capsule (2,000 Units total) by mouth daily. Marland Kitchen  darifenacin (ENABLEX) 15 MG 24 hr tablet, Take 1 tablet (15 mg total) by mouth daily. .  mirabegron ER (MYRBETRIQ) 50 MG TB24 tablet, Take 1 tablet (50 mg total) by mouth daily. .  Multiple Vitamin (MULTIVITAMIN WITH MINERALS) TABS tablet, Take 1  tablet by mouth 2 (two) times daily. Marland Kitchen  saccharomyces boulardii (FLORASTOR) 250 MG capsule, Take 1 capsule (250 mg total) by mouth 2 (two) times daily. .  TRINTELLIX 20 MG TABS, Take 20 mg by mouth every morning. Marland Kitchen  amphetamine-dextroamphetamine (ADDERALL) 20 MG tablet, Take 1 tablet (20 mg total) by mouth 2 (two) times daily. Marland Kitchen  amphetamine-dextroamphetamine (ADDERALL) 20 MG tablet, Take 1 tablet (20 mg total) by mouth 2 (two) times daily. Marland Kitchen  amphetamine-dextroamphetamine (ADDERALL) 20 MG tablet, Take 1 tablet (20 mg total) by mouth 2 (two) times daily.   Reviewed prior external information including notes and imaging from  primary care provider As well as notes that were available from care everywhere and other healthcare systems.  Past medical history, social, surgical and family history all reviewed in electronic medical record.  Green pertanent information unless stated regarding to the chief complaint.   Review of Systems:  Green headache, visual changes, nausea, vomiting, diarrhea, constipation, dizziness, abdominal pain, skin rash, fevers, chills, night sweats, weight loss, swollen lymph nodes, body aches, joint swelling, chest pain, shortness of breath, mood changes. POSITIVE muscle aches  Objective  Blood pressure 118/84, pulse (!) 42, height 5\' 2"  (1.575 m), weight 136 lb (61.7 kg), SpO2 96 %.   General: Green apparent distress alert and oriented x3 mood and affect normal, dressed appropriately.   HEENT: Pupils equal, extraocular movements intact  Respiratory: Patient's speak in full sentences and does not appear short of breath, mild cough Cardiovascular: Green lower extremity edema, non tender, Green erythema  Skin: Warm dry intact with Green signs of infection or rash on extremities or on axial skeleton.  Abdomen: Soft nontender  Neuro: Cranial nerves II through XII are intact, neurovascularly intact in all extremities with 2+ DTRs and 2+ pulses.  Lymph: Green lymphadenopathy of posterior or  anterior cervical chain or axillae bilaterally.  Gait normal with good balance and coordination.  MSK:  Non tender with full range of motion and good stability and symmetric strength and tone of shoulders, elbows, wrist, hip, knee and ankles bilaterally.  Back Exam:  Inspection: Loss of lordosis Motion: Flexion 45 deg, Extension 25 deg, Side Bending to 35 deg bilaterally,  Rotation to 45 deg bilaterally  SLR laying: Negative  XSLR laying: Negative  Palpable tenderness: Tender to palpation more over the sacroiliac joint right greater than left.Marland Kitchen FABER: Positive right. Sensory change: Gross sensation intact to all lumbar and sacral dermatomes.  Reflexes: 2+ at both patellar tendons, 2+ at achilles tendons, Babinski's downgoing.  Strength at foot  Plantar-flexion: 5/5 Dorsi-flexion: 5/5 Eversion: 5/5 Inversion: 5/5  Leg strength  Quad: 5/5 Hamstring: 5/5 Hip flexor: 5/5 Hip abductors: 5/5    Osteopathic findings   T6 extended rotated and side bent left L3 flexed rotated and side bent right Sacrum right on right    Impression and Recommendations:     This case required medical decision making of moderate complexity. The above documentation has been reviewed and is accurate and complete Lyndal Pulley, DO       Note: This dictation was prepared with Dragon dictation along with smaller phrase technology. Any transcriptional errors that result from this process are unintentional.

## 2019-03-31 ENCOUNTER — Ambulatory Visit: Payer: Managed Care, Other (non HMO) | Admitting: Family Medicine

## 2019-03-31 ENCOUNTER — Other Ambulatory Visit: Payer: Self-pay

## 2019-03-31 ENCOUNTER — Encounter: Payer: Self-pay | Admitting: Family Medicine

## 2019-03-31 VITALS — BP 118/84 | HR 42 | Ht 62.0 in | Wt 136.0 lb

## 2019-03-31 DIAGNOSIS — M999 Biomechanical lesion, unspecified: Secondary | ICD-10-CM | POA: Diagnosis not present

## 2019-03-31 DIAGNOSIS — G8929 Other chronic pain: Secondary | ICD-10-CM | POA: Diagnosis not present

## 2019-03-31 DIAGNOSIS — M545 Low back pain: Secondary | ICD-10-CM

## 2019-03-31 NOTE — Patient Instructions (Signed)
Vit D 2000 DHEA 50 mg for one month See me again in one month

## 2019-04-08 ENCOUNTER — Ambulatory Visit: Payer: Managed Care, Other (non HMO) | Attending: Internal Medicine

## 2019-04-08 DIAGNOSIS — Z23 Encounter for immunization: Secondary | ICD-10-CM

## 2019-04-08 NOTE — Progress Notes (Signed)
   Covid-19 Vaccination Clinic  Name:  IMANIE DASHNAW    MRN: [id number] DOB: 01-17-1961  04/08/2019  Ms. Soth was observed post Covid-19 immunization for 30 minutes based on pre-vaccination screening without incident. She was provided with Vaccine Information Sheet and instruction to access the V-Safe system.   Ms. Ellick was instructed to call 911 with any severe reactions post vaccine: Marland Kitchen Difficulty breathing  . Swelling of face and throat  . A fast heartbeat  . A bad rash all over body  . Dizziness and weakness   Immunizations Administered    Name Date Dose VIS Date Route   Pfizer COVID-19 Vaccine 04/08/2019 12:46 PM 0.3 mL 01/01/2019 Intramuscular   Manufacturer: [place]   Lot: [id number]   [place]: [id number]

## 2019-05-04 ENCOUNTER — Ambulatory Visit: Payer: Managed Care, Other (non HMO) | Attending: Internal Medicine

## 2019-05-04 DIAGNOSIS — Z23 Encounter for immunization: Secondary | ICD-10-CM

## 2019-05-04 NOTE — Progress Notes (Addendum)
   Covid-19 Vaccination Clinic  Name:  Shelly Green    MRN: [id number] DOB: [birth year]  05/04/2019  [name] was observed post Covid-19 immunization for 30 minutes without incident. She was provided with Vaccine Information Sheet and instruction to access the V-Safe system.   [name] was instructed to call 911 with any severe reactions post vaccine: Marland Kitchen Difficulty breathing  . Swelling of face and throat  . A fast heartbeat  . A bad rash all over body  . Dizziness and weakness   Immunizations Administered    Name Date Dose VIS Date Route   Pfizer COVID-19 Vaccine 05/04/2019  8:33 AM 0.3 mL 01/01/2019 Intramuscular   Manufacturer: [place]   Lot: [id number]   [place]: [id number]

## 2019-05-06 ENCOUNTER — Encounter: Payer: Self-pay | Admitting: Family Medicine

## 2019-05-06 ENCOUNTER — Ambulatory Visit (INDEPENDENT_AMBULATORY_CARE_PROVIDER_SITE_OTHER): Payer: Managed Care, Other (non HMO) | Admitting: Family Medicine

## 2019-05-06 ENCOUNTER — Other Ambulatory Visit: Payer: Self-pay

## 2019-05-06 DIAGNOSIS — M545 Low back pain: Secondary | ICD-10-CM | POA: Diagnosis not present

## 2019-05-06 DIAGNOSIS — G8929 Other chronic pain: Secondary | ICD-10-CM

## 2019-05-06 DIAGNOSIS — M999 Biomechanical lesion, unspecified: Secondary | ICD-10-CM

## 2019-05-06 NOTE — Progress Notes (Signed)
[place] [address] [place] Phone: [phone] Subjective:   I Shelly Green am serving as a Education administrator for Dr. Hulan Saas.  This visit occurred during the SARS-CoV-2 public health emergency.  Safety protocols were in place, including screening questions prior to the visit, additional usage of staff PPE, and extensive cleaning of exam room while observing appropriate contact time as indicated for disinfecting solutions.   I'm seeing this patient by the request  of:  Plotnikov, Evie Lacks, MD  CC: Low back pain follow-up  [id number]  Shelly Green is a 59 y.o. female coming in with complaint of back pain. Last seen on 03/31/2019 for OMT. Patient states she is still in pain but it is manageable.  Patient states that the pain that is stopping her from activity.  Has had some mild increase in anxiety recently but thinks contributes to some of the tightness in her back.  Does not know if this is true injury or potential for side effect of vaccine.      Past Medical History:  Diagnosis Date  . Arthritis   . Cancer [facility])    breast   . History of colon polyps   . IBS (irritable bowel syndrome)   . Menopausal disorder   . Migraines   . Seasonal allergies   . Social anxiety disorder    Past Surgical History:  Procedure Laterality Date  . CARPAL TUNNEL RELEASE  08/20/2011   Procedure: CARPAL TUNNEL RELEASE;  Surgeon: Cammie Sickle., MD;  Location: [place];  Service: Orthopedics;  Laterality: Right;  . HEMORROIDECTOMY    . LAPAROSCOPIC CHOLECYSTECTOMY  2007  . TOTAL ABDOMINAL HYSTERECTOMY     Social History   Socioeconomic History  . Marital status: Married    Spouse name: Not on file  . Number of children: Not on file  . Years of education: Not on file  . Highest education level: Not on file  Occupational History  . Not on file  Tobacco Use  . Smoking status: Former Smoker    Packs/day: 1.50   Years: 35.00    Pack years: 52.50    Types: Cigarettes    Quit date: 01/24/2017    Years since quitting: 2.2  . Smokeless tobacco: Never Used  Substance and Sexual Activity  . Alcohol use: No    Alcohol/week: 0.0 standard drinks  . Drug use: No  . Sexual activity: Yes  Other Topics Concern  . Not on file  Social History Narrative  . Not on file   Social Determinants of Health   Financial Resource Strain:   . Difficulty of Paying Living Expenses:   Food Insecurity:   . Worried About Charity fundraiser in the Last Year:   . Arboriculturist in the Last Year:   Transportation Needs:   . Film/video editor (Medical):   Marland Kitchen Lack of Transportation (Non-Medical):   Physical Activity:   . Days of Exercise per Week:   . Minutes of Exercise per Session:   Stress:   . Feeling of Stress :   Social Connections:   . Frequency of Communication with Friends and Family:   . Frequency of Social Gatherings with Friends and Family:   . Attends Religious Services:   . Active Member of Clubs or Organizations:   . Attends Archivist Meetings:   Marland Kitchen Marital Status:    Allergies  Allergen Reactions  . Codeine  Anaphylaxis  . Hydrocodone Shortness Of Breath  . Penicillins Anaphylaxis    As a child Has patient had a PCN reaction causing immediate rash, facial/tongue/throat swelling, SOB or lightheadedness with hypotension: yes Has patient had a PCN reaction causing severe rash involving mucus membranes or skin necrosis: unknown Has patient had a PCN reaction that required hospitalization: yes Has patient had a PCN reaction occurring within the last 10 years: no If all of the above answers are "NO", then may proceed with Cephalosporin use.   . Sulfa Antibiotics Anaphylaxis and Rash  . Tramadol Shortness Of Breath  . Clindamycin/Lincomycin     rush   Family History  Problem Relation Age of Onset  . Arthritis Mother   . Lymphoma Mother   . Colon cancer Sister 32  . Mental  illness Daughter   . Mental illness Maternal Aunt   . Arthritis Maternal Grandmother   . Hypertension Maternal Grandfather       Current Outpatient Medications (Respiratory):  .  benzonatate (TESSALON) 200 MG capsule, Take 1 capsule (200 mg total) by mouth 3 (three) times daily as needed for cough. .  diphenhydrAMINE (BENADRYL) 25 mg capsule, Take 1 capsule (25 mg total) by mouth every 6 (six) hours as needed for itching or sleep. .  fluticasone (FLONASE) 50 MCG/ACT nasal spray, Place 2 sprays into both nostrils daily. Marland Kitchen  loratadine (CLARITIN) 10 MG tablet, Take 10 mg by mouth daily.  Current Outpatient Medications (Analgesics):  .  butalbital-acetaminophen-caffeine (FIORICET) 50-325-40 MG tablet, Take 2 tablets by mouth every 6 (six) hours as needed. .  SUMAtriptan (IMITREX) 100 MG tablet, Take 1 tablet (100 mg total) by mouth daily as needed for migraine. May repeat in 2 hours if headache persists or recurs.   Current Outpatient Medications (Other):  .  busPIRone (BUSPAR) 7.5 MG tablet, Take 2 tablets (15 mg total) by mouth 2 (two) times daily. .  Cholecalciferol (VITAMIN D3) 50 MCG (2000 UT) capsule, Take 1 capsule (2,000 Units total) by mouth daily. Marland Kitchen  darifenacin (ENABLEX) 15 MG 24 hr tablet, Take 1 tablet (15 mg total) by mouth daily. .  mirabegron ER (MYRBETRIQ) 50 MG TB24 tablet, Take 1 tablet (50 mg total) by mouth daily. .  Multiple Vitamin (MULTIVITAMIN WITH MINERALS) TABS tablet, Take 1 tablet by mouth 2 (two) times daily. Marland Kitchen  saccharomyces boulardii (FLORASTOR) 250 MG capsule, Take 1 capsule (250 mg total) by mouth 2 (two) times daily. .  TRINTELLIX 20 MG TABS, Take 20 mg by mouth every morning.   Reviewed prior external information including notes and imaging from  primary care provider As well as notes that were available from care everywhere and other healthcare systems.  Past medical history, social, surgical and family history all reviewed in electronic medical  record.  No pertanent information unless stated regarding to the chief complaint.   Review of Systems:  No headache, visual changes, nausea, vomiting, diarrhea, constipation, dizziness, abdominal pain, skin rash, fevers, chills, night sweats, weight loss, swollen lymph nodes, body aches, joint swelling, chest pain, shortness of breath, mood changes. POSITIVE muscle aches  Objective  Blood pressure 120/78, pulse 80, height 5\' 2"  (1.575 m), weight 136 lb (61.7 kg), SpO2 98 %.   General: No apparent distress alert and oriented x3 mood and affect normal, dressed appropriately.  HEENT: Pupils equal, extraocular movements intact  Respiratory: Patient's speak in full sentences and does not appear short of breath  Cardiovascular: No lower extremity edema, non tender, no erythema  Neuro: Cranial nerves II through XII are intact, neurovascularly intact in all extremities with 2+ DTRs and 2+ pulses.  Gait normal with good balance and coordination.  MSK:  Non tender with full range of motion and good stability and symmetric strength and tone of shoulders, elbows, wrist, hip, knee and ankles bilaterally.  Low back exam does have some loss of lordosis.  Tightness noted mild FABER test.  Neurovascular intact distally.  Osteopathic findings  T7 extended rotated and side bent left L2 flexed rotated and side bent right Sacrum right on right    Impression and Recommendations:     This case required medical decision making of moderate complexity. The above documentation has been reviewed and is accurate and complete Lyndal Pulley, DO       Note: This dictation was prepared with Dragon dictation along with smaller phrase technology. Any transcriptional errors that result from this process are unintentional.

## 2019-05-06 NOTE — Assessment & Plan Note (Signed)
Patient is making some progress.  Chronic problem but seems to be stable.  Responded well to osteopathic manipulation.  We discussed other treatment options.  We will continue to watch closely with patient having a history of breast cancer but do not feel advanced imaging is necessary increase activity as tolerated and discussed hydration.  Follow-up with me in 4 to 8 weeks

## 2019-05-06 NOTE — Assessment & Plan Note (Addendum)

## 2019-05-06 NOTE — Patient Instructions (Signed)
Vit D 2000IU 1/2 cup gatorade with every cup of water See me in 6-8 weeks

## 2019-05-11 ENCOUNTER — Other Ambulatory Visit: Payer: Self-pay

## 2019-05-11 ENCOUNTER — Encounter: Payer: Self-pay | Admitting: Internal Medicine

## 2019-05-11 ENCOUNTER — Ambulatory Visit: Payer: Managed Care, Other (non HMO) | Admitting: Internal Medicine

## 2019-05-11 DIAGNOSIS — R6882 Decreased libido: Secondary | ICD-10-CM | POA: Diagnosis not present

## 2019-05-11 DIAGNOSIS — C50919 Malignant neoplasm of unspecified site of unspecified female breast: Secondary | ICD-10-CM | POA: Diagnosis not present

## 2019-05-11 DIAGNOSIS — F988 Other specified behavioral and emotional disorders with onset usually occurring in childhood and adolescence: Secondary | ICD-10-CM

## 2019-05-11 MED ORDER — AMPHETAMINE-DEXTROAMPHET ER 30 MG PO CP24: 30.0000 mg | ORAL_CAPSULE | ORAL | 0 refills | Status: DC

## 2019-05-11 MED ORDER — AMPHETAMINE-DEXTROAMPHET ER 30 MG PO CP24: 30.0000 mg | ORAL_CAPSULE | Freq: Every day | ORAL | 0 refills | Status: DC

## 2019-05-11 NOTE — Assessment & Plan Note (Signed)
F/u w/onc q 6 mo

## 2019-05-11 NOTE — Assessment & Plan Note (Signed)
Try L arginine, lubricant

## 2019-05-11 NOTE — Progress Notes (Signed)
Subjective:  Patient ID: Shelly Green, female    DOB: August 28, 1960  Age: 59 y.o. MRN: [id number]  CC: No chief complaint on file.   HPI Shelly Green presents for ADD f/u C/o low libido after breast ca dx/treatment  Outpatient Medications Prior to Visit  Medication Sig Dispense Refill  . amphetamine-dextroamphetamine (ADDERALL XR) 30 MG 24 hr capsule Take 30 mg by mouth daily.    . busPIRone (BUSPAR) 7.5 MG tablet Take 2 tablets (15 mg total) by mouth 2 (two) times daily. 360 tablet 1  . butalbital-acetaminophen-caffeine (FIORICET) 50-325-40 MG tablet Take 2 tablets by mouth every 6 (six) hours as needed. 40 tablet 1  . Cholecalciferol (VITAMIN D3) 50 MCG (2000 UT) capsule Take 1 capsule (2,000 Units total) by mouth daily. 100 capsule 3  . diphenhydrAMINE (BENADRYL) 25 mg capsule Take 1 capsule (25 mg total) by mouth every 6 (six) hours as needed for itching or sleep. 10 capsule 0  . fluticasone (FLONASE) 50 MCG/ACT nasal spray Place 2 sprays into both nostrils daily. 16 g 6  . letrozole (FEMARA) 2.5 MG tablet Take 2.5 mg by mouth daily.    Marland Kitchen loratadine (CLARITIN) 10 MG tablet Take 10 mg by mouth daily.    . Multiple Vitamin (MULTIVITAMIN WITH MINERALS) TABS tablet Take 1 tablet by mouth 2 (two) times daily.    Marland Kitchen saccharomyces boulardii (FLORASTOR) 250 MG capsule Take 1 capsule (250 mg total) by mouth 2 (two) times daily. 30 capsule 0  . SUMAtriptan (IMITREX) 100 MG tablet Take 1 tablet (100 mg total) by mouth daily as needed for migraine. May repeat in 2 hours if headache persists or recurs. 12 tablet 3  . benzonatate (TESSALON) 200 MG capsule Take 1 capsule (200 mg total) by mouth 3 (three) times daily as needed for cough. 30 capsule 0  . darifenacin (ENABLEX) 15 MG 24 hr tablet Take 1 tablet (15 mg total) by mouth daily. 30 tablet 11  . mirabegron ER (MYRBETRIQ) 50 MG TB24 tablet Take 1 tablet (50 mg total) by mouth daily. (Patient not taking: Reported on 05/11/2019) 30 tablet 11    . TRINTELLIX 20 MG TABS Take 20 mg by mouth every morning.  2   No facility-administered medications prior to visit.    ROS: Review of Systems  Constitutional: Negative for activity change, appetite change, chills, fatigue and unexpected weight change.  HENT: Negative for congestion, mouth sores and sinus pressure.   Eyes: Negative for visual disturbance.  Respiratory: Negative for cough and chest tightness.   Gastrointestinal: Negative for abdominal pain and nausea.  Genitourinary: Negative for difficulty urinating, frequency and vaginal pain.  Musculoskeletal: Negative for back pain and gait problem.  Skin: Negative for pallor and rash.  Neurological: Negative for dizziness, tremors, weakness, numbness and headaches.  Psychiatric/Behavioral: Positive for decreased concentration. Negative for confusion and sleep disturbance.    Objective:  BP 128/82 (BP Location: Left Arm, Patient Position: Sitting, Cuff Size: Normal)   Pulse 73   Temp 98.2 F (36.8 C) (Oral)   Ht 5\' 2"  (1.575 m)   Wt 114 lb (51.7 kg)   SpO2 97%   BMI 20.85 kg/m   BP Readings from Last 3 Encounters:  05/11/19 128/82  05/06/19 120/78  03/31/19 118/84    Wt Readings from Last 3 Encounters:  05/11/19 114 lb (51.7 kg)  05/06/19 136 lb (61.7 kg)  03/31/19 136 lb (61.7 kg)    Physical Exam Constitutional:      General:  She is not in acute distress.    Appearance: She is well-developed.  HENT:     Head: Normocephalic.     Right Ear: External ear normal.     Left Ear: External ear normal.     Nose: Nose normal.  Eyes:     General:        Right eye: No discharge.        Left eye: No discharge.     Conjunctiva/sclera: Conjunctivae normal.     Pupils: Pupils are equal, round, and reactive to light.  Neck:     Thyroid: No thyromegaly.     Vascular: No JVD.     Trachea: No tracheal deviation.  Cardiovascular:     Rate and Rhythm: Normal rate and regular rhythm.     Heart sounds: Normal heart  sounds.  Pulmonary:     Effort: No respiratory distress.     Breath sounds: No stridor. No wheezing.  Abdominal:     General: Bowel sounds are normal. There is no distension.     Palpations: Abdomen is soft. There is no mass.     Tenderness: There is no abdominal tenderness. There is no guarding or rebound.  Musculoskeletal:        General: No tenderness.     Cervical back: Normal range of motion and neck supple.  Lymphadenopathy:     Cervical: No cervical adenopathy.  Skin:    Findings: No erythema or rash.  Neurological:     Mental Status: She is oriented to person, place, and time.     Cranial Nerves: No cranial nerve deficit.     Motor: No abnormal muscle tone.     Coordination: Coordination normal.     Deep Tendon Reflexes: Reflexes normal.  Psychiatric:        Behavior: Behavior normal.        Thought Content: Thought content normal.        Judgment: Judgment normal.     Lab Results  Component Value Date   WBC 8.3 12/29/2018   HGB 14.5 12/29/2018   HCT 43.3 12/29/2018   PLT 171.0 12/29/2018   GLUCOSE 114 (H) 12/29/2018   CHOL 145 12/29/2018   TRIG 62.0 12/29/2018   HDL 47.70 12/29/2018   LDLCALC 85 12/29/2018   ALT 13 12/29/2018   AST 15 12/29/2018   NA 142 12/29/2018   K 3.4 (L) 12/29/2018   CL 108 12/29/2018   CREATININE 0.65 12/29/2018   BUN 18 12/29/2018   CO2 27 12/29/2018   TSH 2.64 12/29/2018   INR 1.02 09/28/2014    CT CARDIAC SCORING  Addendum Date: 01/20/2019   ADDENDUM REPORT: 01/20/2019 08:46 CLINICAL DATA:  Risk stratification EXAM: Coronary Calcium Score TECHNIQUE: The patient was scanned on a [employer] scanner. Axial non-contrast 3 mm slices were carried out through the heart. The data set was analyzed on a dedicated work station and scored using the [place]. FINDINGS: Non-cardiac: See separate report from [facility]. Ascending Aorta: Normal caliber. Pericardium: Normal. Coronary arteries: Normal origins. IMPRESSION:  Coronary calcium score of 0. Eleonore Chiquito, MD Electronically Signed   By: Eleonore Chiquito   On: 01/20/2019 08:46   Result Date: 01/20/2019 EXAM: OVER-READ INTERPRETATION  CT CHEST The following report is an over-read performed by radiologist Dr. Rolm Baptise of [facility], [place] on 01/20/2019. This over-read does not include interpretation of cardiac or coronary anatomy or pathology. The coronary calcium score interpretation by the cardiologist is attached. COMPARISON:  03/31/2007. FINDINGS: Vascular: Heart is normal size.  Visualized aorta normal caliber. Mediastinum/Nodes: No adenopathy in the lower mediastinum or hila. Lungs/Pleura: Right middle lobe nodule measures 4 mm on image 14. This is stable since 2009 compatible with benign nodule. Lingular nodule on the same image measures 2-3 mm and is stable since 2009. No confluent opacities or effusions. Upper Abdomen: Imaging into the upper abdomen shows no acute findings. Musculoskeletal: Bilateral breast implants. Chest wall soft tissues are unremarkable otherwise. No acute bony abnormality. IMPRESSION: Small bilateral pulmonary nodules are stable since 2009 compatible with benign nodules. No acute extracardiac findings or significant abnormality. Electronically Signed: By: Rolm Baptise M.D. On: 01/20/2019 08:43    Assessment & Plan:    Walker Kehr, MD

## 2019-05-11 NOTE — Assessment & Plan Note (Signed)
Adderall Rx  Potential benefits of a long term opioids use as well as potential risks (i.e. addiction risk, apnea etc) and complications (i.e. Somnolence, constipation and others) were explained to the patient and were aknowledged.  

## 2019-06-24 ENCOUNTER — Other Ambulatory Visit: Payer: Self-pay

## 2019-06-24 ENCOUNTER — Encounter: Payer: Self-pay | Admitting: Family Medicine

## 2019-06-24 ENCOUNTER — Ambulatory Visit: Payer: Managed Care, Other (non HMO) | Admitting: Family Medicine

## 2019-06-24 ENCOUNTER — Ambulatory Visit: Payer: Self-pay

## 2019-06-24 VITALS — BP 108/72 | HR 84 | Ht 62.0 in | Wt 114.0 lb

## 2019-06-24 DIAGNOSIS — M545 Low back pain: Secondary | ICD-10-CM | POA: Diagnosis not present

## 2019-06-24 DIAGNOSIS — M79641 Pain in right hand: Secondary | ICD-10-CM

## 2019-06-24 DIAGNOSIS — M67441 Ganglion, right hand: Secondary | ICD-10-CM | POA: Diagnosis not present

## 2019-06-24 DIAGNOSIS — M999 Biomechanical lesion, unspecified: Secondary | ICD-10-CM

## 2019-06-24 DIAGNOSIS — G8929 Other chronic pain: Secondary | ICD-10-CM

## 2019-06-24 NOTE — Assessment & Plan Note (Signed)
Patient does have a ganglion cyst on the palmar aspect of the metacarpal joints between the second and third fingers.  Patient would like to avoid serial aspirations and would like removal.  Referred to hand surgeon at this time.

## 2019-06-24 NOTE — Patient Instructions (Signed)
Dr. Vanetta Shawl office will call you Note for lifting limit See me for your back in 6-8 weeks

## 2019-06-24 NOTE — Progress Notes (Signed)
Shelly [name] [place]ly [name] Phone: [phone] Subjective:   Shelly [name], am serving as a scribe for Dr. Hulan Saas. This visit occurred during the SARS-CoV-2 public health emergency.  Safety protocols were in place, including screening questions prior to the visit, additional usage of staff PPE, and extensive cleaning of exam room while observing appropriate contact time as indicated for disinfecting solutions.   I'm seeing this patient by the request  of:  Plotnikov, Evie Lacks, MD Low back pain follow-up, hand cyst follow-up CC:   [id number]   12/15/2018 Aspiration done today after evaluation with diagnostic ultrasound that showed that this was a possibility..  Tolerated the procedure well, discussed icing regimen and home exercise, discussed which activities to do which wants to avoid.  Patient is to increase activity slowly over the course the next several weeks.  Discussed bracing at night.  Worsening symptoms will need advanced imaging but I believe patient will do relatively well.  Update  Shelly [name] is a 59 y.o. female coming in with complaint of back and neck pain. Last seen on 05/06/2019 for OMT. Patient states that her back has been bothering her. Also complaining of right hand pain from a cyst.  The patient did have a tingling cyst on the palmar aspect of her hand.  Feels slightly reaccumulating.  Aspiration was done in November.  Medications patient has been prescribed: Over-the-counter anti-inflammatories intermittently          Reviewed prior external information including notes and imaging from previsou exam, outside providers and external EMR if available.   As well as notes that were available from care everywhere and other healthcare systems.  Past medical history, social, surgical and family history all reviewed in electronic medical record.  [name] pertanent information unless stated regarding to  the chief complaint.   Past Medical History:  Diagnosis Date  . Arthritis   . Cancer [facility])    breast   . History of colon polyps   . IBS (irritable bowel syndrome)   . Menopausal disorder   . Migraines   . Seasonal allergies   . Social anxiety disorder     Allergies  Allergen Reactions  . Codeine Anaphylaxis  . Hydrocodone Shortness Of Breath  . Penicillins Anaphylaxis    As a child Has patient had a PCN reaction causing immediate rash, facial/tongue/throat swelling, SOB or lightheadedness with hypotension: yes Has patient had a PCN reaction causing severe rash involving mucus membranes or skin necrosis: unknown Has patient had a PCN reaction that required hospitalization: yes Has patient had a PCN reaction occurring within the last 10 years: [name] If all of the above answers are "[name]", then may proceed with Cephalosporin use.   . Sulfa Antibiotics Anaphylaxis and Rash  . Tramadol Shortness Of Breath  . Clindamycin/Lincomycin     rush     Review of Systems:  [name] headache, visual changes, nausea, vomiting, diarrhea, constipation, dizziness, abdominal pain, skin rash, fevers, chills, night sweats, weight loss, swollen lymph nodes, body aches, joint swelling, chest pain, shortness of breath, mood changes. POSITIVE muscle aches  Objective  There were [name] vitals taken for this visit.   General: [name] apparent distress alert and oriented x3 mood and affect normal, dressed appropriately.  HEENT: Pupils equal, extraocular movements intact  Respiratory: Patient's speak in full sentences and does not appear short of breath  Cardiovascular: [name] lower extremity edema, non tender, [name] erythema  Neuro: Cranial nerves  II through XII are intact, neurovascularly intact in all extremities with 2+ DTRs and 2+ pulses.  Gait normal with good balance and coordination.  MSK:  Non tender with full range of motion and good stability and symmetric strength and tone of shoulders, elbows, wrist, hip, knee and  ankles bilaterally.    Right hand exam shows the patient does have a large cyst starting to come back again between the second and third fingers on the palmar aspect at the MCP.  Tender to palpation in the area. Back -back exam still is tender over the right sacroiliac joint.  Positive FABER test on the side.  Negative straight leg test.  5-5 strength in lower extremities.  Improvement in core strength   Limited musculoskeletal ultrasound was performed and interpreted by  Lyndal Pulley  Limited ultrasound of patient's heel shows a ganglion cyst has returned at this point and does have a reactive tenosynovitis of the third flexor tendon sheath.  osteopathic findings   T5 extended rotated and side bent left L3 flexed rotated and side bent right Sacrum right on right       Assessment and Plan:    Nonallopathic problems  Decision today to treat with OMT was based on Physical Exam  After verbal consent patient was treated with HVLA, ME, FPR techniques in cervical, rib, thoracic, lumbar, and sacral  areas  Patient tolerated the procedure well with improvement in symptoms  Patient given exercises, stretches and lifestyle modifications  See medications in patient instructions if given  Patient will follow up in 4-8 weeks      The above documentation has been reviewed and is accurate and complete Lyndal Pulley, DO       Note: This dictation was prepared with Dragon dictation along with smaller phrase technology. Any transcriptional errors that result from this process are unintentional.

## 2019-06-24 NOTE — Assessment & Plan Note (Signed)
Chronic problem with mild exacerbation.  Has been doing increasing lifting again.  Given note for work.  Discussed medications including vitamin D, patient declined any other prescription medications at this time.  Trial of topical anti-inflammatories.  Follow-up again 6 to 8 weeks.

## 2019-08-07 ENCOUNTER — Other Ambulatory Visit: Payer: Self-pay | Admitting: Internal Medicine

## 2019-08-11 ENCOUNTER — Encounter: Payer: Self-pay | Admitting: Internal Medicine

## 2019-08-11 ENCOUNTER — Ambulatory Visit: Payer: Managed Care, Other (non HMO) | Admitting: Internal Medicine

## 2019-08-11 ENCOUNTER — Other Ambulatory Visit: Payer: Self-pay

## 2019-08-11 DIAGNOSIS — M6740 Ganglion, unspecified site: Secondary | ICD-10-CM

## 2019-08-11 DIAGNOSIS — F988 Other specified behavioral and emotional disorders with onset usually occurring in childhood and adolescence: Secondary | ICD-10-CM

## 2019-08-11 DIAGNOSIS — C50919 Malignant neoplasm of unspecified site of unspecified female breast: Secondary | ICD-10-CM | POA: Diagnosis not present

## 2019-08-11 MED ORDER — AMPHETAMINE-DEXTROAMPHET ER 30 MG PO CP24: 30.0000 mg | ORAL_CAPSULE | ORAL | 0 refills | Status: DC

## 2019-08-11 MED ORDER — AMPHETAMINE-DEXTROAMPHET ER 30 MG PO CP24: 30.0000 mg | ORAL_CAPSULE | Freq: Every day | ORAL | 0 refills | Status: DC

## 2019-08-11 NOTE — Assessment & Plan Note (Signed)
L Hand - surgeryis pending

## 2019-08-11 NOTE — Assessment & Plan Note (Signed)
Surgery to remove sutures is pending

## 2019-08-11 NOTE — Progress Notes (Signed)
Subjective:  Patient ID: Shelly Green, female    DOB: Jan 07, 1961  Age: 59 y.o. MRN: [id number]  CC: No chief complaint on file.   HPI Shelly Green presents for ADD, breast problem, ganglion f/u  Outpatient Medications Prior to Visit  Medication Sig Dispense Refill  . busPIRone (BUSPAR) 7.5 MG tablet Take 2 tablets (15 mg total) by mouth 2 (two) times daily. 360 tablet 1  . butalbital-acetaminophen-caffeine (FIORICET) 50-325-40 MG tablet Take 2 tablets by mouth every 6 (six) hours as needed. 40 tablet 1  . Cholecalciferol (VITAMIN D3) 50 MCG (2000 UT) capsule Take 1 capsule (2,000 Units total) by mouth daily. 100 capsule 3  . diphenhydrAMINE (BENADRYL) 25 mg capsule Take 1 capsule (25 mg total) by mouth every 6 (six) hours as needed for itching or sleep. 10 capsule 0  . fluticasone (FLONASE) 50 MCG/ACT nasal spray Place 2 sprays into both nostrils daily. 16 g 6  . letrozole (FEMARA) 2.5 MG tablet Take 2.5 mg by mouth daily.    Marland Kitchen loratadine (CLARITIN) 10 MG tablet Take 10 mg by mouth daily.    . Multiple Vitamin (MULTIVITAMIN WITH MINERALS) TABS tablet Take 1 tablet by mouth 2 (two) times daily.    Marland Kitchen saccharomyces boulardii (FLORASTOR) 250 MG capsule Take 1 capsule (250 mg total) by mouth 2 (two) times daily. 30 capsule 0  . SUMAtriptan (IMITREX) 100 MG tablet TAKE 1 TAB BY MOUTH DAILY AS NEEDED FOR MIGRAINE MAY REPEAT IN 2HOURS IF HEADACHE PERSISTS OR RECURS 12 tablet 3  . amphetamine-dextroamphetamine (ADDERALL XR) 30 MG 24 hr capsule Take 1 capsule (30 mg total) by mouth daily. 30 capsule 0  . amphetamine-dextroamphetamine (ADDERALL XR) 30 MG 24 hr capsule Take 1 capsule (30 mg total) by mouth every morning. 30 capsule 0  . amphetamine-dextroamphetamine (ADDERALL XR) 30 MG 24 hr capsule Take 1 capsule (30 mg total) by mouth every morning. 30 capsule 0   No facility-administered medications prior to visit.    ROS: Review of Systems  Constitutional: Negative for activity  change, appetite change, chills, fatigue and unexpected weight change.  HENT: Negative for congestion, mouth sores and sinus pressure.   Eyes: Negative for visual disturbance.  Respiratory: Negative for cough and chest tightness.   Gastrointestinal: Negative for abdominal pain and nausea.  Genitourinary: Negative for difficulty urinating, frequency and vaginal pain.  Musculoskeletal: Negative for back pain and gait problem.  Skin: Negative for pallor and rash.  Neurological: Negative for dizziness, tremors, weakness, numbness and headaches.  Psychiatric/Behavioral: Positive for decreased concentration. Negative for confusion and sleep disturbance.    Objective:  BP 110/74 (BP Location: Right Arm, Patient Position: Sitting, Cuff Size: Normal)   Pulse 84   Temp 98.4 F (36.9 C) (Oral)   Ht 5\' 2"  (1.575 m)   Wt 119 lb (54 kg)   SpO2 97%   BMI 21.77 kg/m   BP Readings from Last 3 Encounters:  08/11/19 110/74  06/24/19 108/72  05/11/19 128/82    Wt Readings from Last 3 Encounters:  08/11/19 119 lb (54 kg)  06/24/19 114 lb (51.7 kg)  05/11/19 114 lb (51.7 kg)    Physical Exam Constitutional:      General: She is not in acute distress.    Appearance: She is well-developed.  HENT:     Head: Normocephalic.     Right Ear: External ear normal.     Left Ear: External ear normal.     Nose: Nose normal.  Eyes:  General:        Right eye: No discharge.        Left eye: No discharge.     Conjunctiva/sclera: Conjunctivae normal.     Pupils: Pupils are equal, round, and reactive to light.  Neck:     Thyroid: No thyromegaly.     Vascular: No JVD.     Trachea: No tracheal deviation.  Cardiovascular:     Rate and Rhythm: Normal rate and regular rhythm.     Heart sounds: Normal heart sounds.  Pulmonary:     Effort: No respiratory distress.     Breath sounds: No stridor. No wheezing.  Abdominal:     General: Bowel sounds are normal. There is no distension.     Palpations:  Abdomen is soft. There is no mass.     Tenderness: There is no abdominal tenderness. There is no guarding or rebound.  Musculoskeletal:        General: No tenderness.     Cervical back: Normal range of motion and neck supple.  Lymphadenopathy:     Cervical: No cervical adenopathy.  Skin:    Findings: No erythema or rash.  Neurological:     Mental Status: She is oriented to person, place, and time.     Cranial Nerves: No cranial nerve deficit.     Motor: No abnormal muscle tone.     Coordination: Coordination normal.     Deep Tendon Reflexes: Reflexes normal.  Psychiatric:        Behavior: Behavior normal.        Thought Content: Thought content normal.        Judgment: Judgment normal.     Lab Results  Component Value Date   WBC 8.3 12/29/2018   HGB 14.5 12/29/2018   HCT 43.3 12/29/2018   PLT 171.0 12/29/2018   GLUCOSE 114 (H) 12/29/2018   CHOL 145 12/29/2018   TRIG 62.0 12/29/2018   HDL 47.70 12/29/2018   LDLCALC 85 12/29/2018   ALT 13 12/29/2018   AST 15 12/29/2018   NA 142 12/29/2018   K 3.4 (L) 12/29/2018   CL 108 12/29/2018   CREATININE 0.65 12/29/2018   BUN 18 12/29/2018   CO2 27 12/29/2018   TSH 2.64 12/29/2018   INR 1.02 09/28/2014    CT CARDIAC SCORING  Addendum Date: 01/20/2019   ADDENDUM REPORT: 01/20/2019 08:46 CLINICAL DATA:  Risk stratification EXAM: Coronary Calcium Score TECHNIQUE: The patient was scanned on a [employer] scanner. Axial non-contrast 3 mm slices were carried out through the heart. The data set was analyzed on a dedicated work station and scored using the [place]. FINDINGS: Non-cardiac: See separate report from [facility]. Ascending Aorta: Normal caliber. Pericardium: Normal. Coronary arteries: Normal origins. IMPRESSION: Coronary calcium score of 0. Eleonore Chiquito, MD Electronically Signed   By: Eleonore Chiquito   On: 01/20/2019 08:46   Result Date: 01/20/2019 EXAM: OVER-READ INTERPRETATION  CT CHEST The following  report is an over-read performed by radiologist Dr. Rolm Baptise of [facility], [place] on 01/20/2019. This over-read does not include interpretation of cardiac or coronary anatomy or pathology. The coronary calcium score interpretation by the cardiologist is attached. COMPARISON:  03/31/2007. FINDINGS: Vascular: Heart is normal size.  Visualized aorta normal caliber. Mediastinum/Nodes: No adenopathy in the lower mediastinum or hila. Lungs/Pleura: Right middle lobe nodule measures 4 mm on image 14. This is stable since 2009 compatible with benign nodule. Lingular nodule on the same image measures 2-3 mm and is  stable since 2009. No confluent opacities or effusions. Upper Abdomen: Imaging into the upper abdomen shows no acute findings. Musculoskeletal: Bilateral breast implants. Chest wall soft tissues are unremarkable otherwise. No acute bony abnormality. IMPRESSION: Small bilateral pulmonary nodules are stable since 2009 compatible with benign nodules. No acute extracardiac findings or significant abnormality. Electronically Signed: By: Rolm Baptise M.D. On: 01/20/2019 08:43    Assessment & Plan:   There are no diagnoses linked to this encounter.   Meds ordered this encounter  Medications  . amphetamine-dextroamphetamine (ADDERALL XR) 30 MG 24 hr capsule    Sig: Take 1 capsule (30 mg total) by mouth daily.    Dispense:  30 capsule    Refill:  0    Please fill on or after 10/10/19  . amphetamine-dextroamphetamine (ADDERALL XR) 30 MG 24 hr capsule    Sig: Take 1 capsule (30 mg total) by mouth every morning.    Dispense:  30 capsule    Refill:  0    Please fill on or after 08/11/19  . amphetamine-dextroamphetamine (ADDERALL XR) 30 MG 24 hr capsule    Sig: Take 1 capsule (30 mg total) by mouth every morning.    Dispense:  30 capsule    Refill:  0    Please fill on or after 09/10/19     Follow-up: No follow-ups on file.  Walker Kehr, MD

## 2019-08-11 NOTE — Assessment & Plan Note (Signed)
Adderall Rx  Potential benefits of a long term opioids use as well as potential risks (i.e. addiction risk, apnea etc) and complications (i.e. Somnolence, constipation and others) were explained to the patient and were aknowledged.

## 2019-08-16 ENCOUNTER — Other Ambulatory Visit: Payer: Self-pay

## 2019-08-16 ENCOUNTER — Ambulatory Visit: Payer: Managed Care, Other (non HMO) | Admitting: Family Medicine

## 2019-08-16 ENCOUNTER — Encounter: Payer: Self-pay | Admitting: Family Medicine

## 2019-08-16 VITALS — BP 122/70 | HR 74 | Ht 62.0 in | Wt 120.0 lb

## 2019-08-16 DIAGNOSIS — M545 Low back pain: Secondary | ICD-10-CM | POA: Diagnosis not present

## 2019-08-16 DIAGNOSIS — M999 Biomechanical lesion, unspecified: Secondary | ICD-10-CM | POA: Diagnosis not present

## 2019-08-16 DIAGNOSIS — G8929 Other chronic pain: Secondary | ICD-10-CM

## 2019-08-16 DIAGNOSIS — M67441 Ganglion, right hand: Secondary | ICD-10-CM

## 2019-08-16 NOTE — Progress Notes (Signed)
[place] [address] [place] Phone: [phone] Subjective:   I Shelly Green am serving as a Education administrator for Dr. Hulan Saas.  This visit occurred during the SARS-CoV-2 public health emergency.  Safety protocols were in place, including screening questions prior to the visit, additional usage of staff PPE, and extensive cleaning of exam room while observing appropriate contact time as indicated for disinfecting solutions.   I'm seeing this patient by the request  of:  Plotnikov, Evie Lacks, MD  CC: Hand pain, upper back pain follow-up  [id number]   06/24/2019 Chronic problem with mild exacerbation.  Has been doing increasing lifting again.  Given note for work.  Discussed medications including vitamin D, patient declined any other prescription medications at this time.  Trial of topical anti-inflammatories.  Follow-up again 6 to 8 weeks.  Patient does have a ganglion cyst on the palmar aspect of the metacarpal joints between the second and third fingers.  Patient would like to avoid serial aspirations and would like removal.  Referred to hand surgeon at this time.   Shelly Green is a 59 y.o. female coming in with complaint of back pain. Patient referred to Dr. Amedeo Plenty for hand pain. Patient states her hand is doing better. Took time off. States it is still painful. Back is ok. Also doing better due to being out of work.  Patient is doing relatively well but has some more discomfort in the upper back again.  Has been helping build a fence at home     Past Medical History:  Diagnosis Date  . Arthritis   . Cancer [facility])    breast   . History of colon polyps   . IBS (irritable bowel syndrome)   . Menopausal disorder   . Migraines   . Seasonal allergies   . Social anxiety disorder    Past Surgical History:  Procedure Laterality Date  . CARPAL TUNNEL RELEASE  08/20/2011   Procedure: CARPAL TUNNEL RELEASE;  Surgeon: Cammie Sickle., MD;  Location: [facility];  Service: Orthopedics;  Laterality: Right;  . HEMORROIDECTOMY    . LAPAROSCOPIC CHOLECYSTECTOMY  2007  . TOTAL ABDOMINAL HYSTERECTOMY     Social History   Socioeconomic History  . Marital status: Married    Spouse name: Not on file  . Number of children: Not on file  . Years of education: Not on file  . Highest education level: Not on file  Occupational History  . Not on file  Tobacco Use  . Smoking status: Former Smoker    Packs/day: 1.50    Years: 35.00    Pack years: 52.50    Types: Cigarettes    Quit date: 01/24/2017    Years since quitting: 2.5  . Smokeless tobacco: Never Used  Substance and Sexual Activity  . Alcohol use: No    Alcohol/week: 0.0 standard drinks  . Drug use: No  . Sexual activity: Yes  Other Topics Concern  . Not on file  Social History Narrative  . Not on file   Social Determinants of Health   Financial Resource Strain:   . Difficulty of Paying Living Expenses:   Food Insecurity:   . Worried About Charity fundraiser in the Last Year:   . Arboriculturist in the Last Year:   Transportation Needs:   . Film/video editor (Medical):   Marland Kitchen Lack of Transportation (Non-Medical):   Physical Activity:   .  Days of Exercise per Week:   . Minutes of Exercise per Session:   Stress:   . Feeling of Stress :   Social Connections:   . Frequency of Communication with Friends and Family:   . Frequency of Social Gatherings with Friends and Family:   . Attends Religious Services:   . Active Member of Clubs or Organizations:   . Attends Archivist Meetings:   Marland Kitchen Marital Status:    Allergies  Allergen Reactions  . Codeine Anaphylaxis  . Hydrocodone Shortness Of Breath  . Penicillins Anaphylaxis    As a child Has patient had a PCN reaction causing immediate rash, facial/tongue/throat swelling, SOB or lightheadedness with hypotension: yes Has patient had a PCN reaction causing severe rash involving  mucus membranes or skin necrosis: unknown Has patient had a PCN reaction that required hospitalization: yes Has patient had a PCN reaction occurring within the last 10 years: no If all of the above answers are "NO", then may proceed with Cephalosporin use.   . Sulfa Antibiotics Anaphylaxis and Rash  . Tramadol Shortness Of Breath  . Clindamycin/Lincomycin     rush   Family History  Problem Relation Age of Onset  . Arthritis Mother   . Lymphoma Mother   . Colon cancer Sister 37  . Mental illness Daughter   . Mental illness Maternal Aunt   . Arthritis Maternal Grandmother   . Hypertension Maternal Grandfather       Current Outpatient Medications (Respiratory):  .  diphenhydrAMINE (BENADRYL) 25 mg capsule, Take 1 capsule (25 mg total) by mouth every 6 (six) hours as needed for itching or sleep. .  fluticasone (FLONASE) 50 MCG/ACT nasal spray, Place 2 sprays into both nostrils daily. Marland Kitchen  loratadine (CLARITIN) 10 MG tablet, Take 10 mg by mouth daily.  Current Outpatient Medications (Analgesics):  .  butalbital-acetaminophen-caffeine (FIORICET) 50-325-40 MG tablet, Take 2 tablets by mouth every 6 (six) hours as needed. .  SUMAtriptan (IMITREX) 100 MG tablet, TAKE 1 TAB BY MOUTH DAILY AS NEEDED FOR MIGRAINE MAY REPEAT IN 2HOURS IF HEADACHE PERSISTS OR RECURS   Current Outpatient Medications (Other):  .  amphetamine-dextroamphetamine (ADDERALL XR) 30 MG 24 hr capsule, Take 1 capsule (30 mg total) by mouth daily. Marland Kitchen  amphetamine-dextroamphetamine (ADDERALL XR) 30 MG 24 hr capsule, Take 1 capsule (30 mg total) by mouth every morning. Marland Kitchen  amphetamine-dextroamphetamine (ADDERALL XR) 30 MG 24 hr capsule, Take 1 capsule (30 mg total) by mouth every morning. .  busPIRone (BUSPAR) 7.5 MG tablet, Take 2 tablets (15 mg total) by mouth 2 (two) times daily. .  Cholecalciferol (VITAMIN D3) 50 MCG (2000 UT) capsule, Take 1 capsule (2,000 Units total) by mouth daily. Marland Kitchen  letrozole (FEMARA) 2.5 MG tablet,  Take 2.5 mg by mouth daily. .  Multiple Vitamin (MULTIVITAMIN WITH MINERALS) TABS tablet, Take 1 tablet by mouth 2 (two) times daily. Marland Kitchen  saccharomyces boulardii (FLORASTOR) 250 MG capsule, Take 1 capsule (250 mg total) by mouth 2 (two) times daily.   Reviewed prior external information including notes and imaging from  primary care provider As well as notes that were available from care everywhere and other healthcare systems.  Past medical history, social, surgical and family history all reviewed in electronic medical record.  No pertanent information unless stated regarding to the chief complaint.   Review of Systems:  No headache, visual changes, nausea, vomiting, diarrhea, constipation, dizziness, abdominal pain, skin rash, fevers, chills, night sweats, weight loss, swollen lymph nodes, body  aches, joint swelling, chest pain, shortness of breath, mood changes. POSITIVE muscle aches  Objective  Blood pressure 122/70, pulse 74, height 5\' 2"  (1.575 m), weight 120 lb (54.4 kg), SpO2 97 %.   General: No apparent distress alert and oriented x3 mood and affect normal, dressed appropriately.  HEENT: Pupils equal, extraocular movements intact  Respiratory: Patient's speak in full sentences and does not appear short of breath  Cardiovascular: No lower extremity edema, non tender, no erythema  Neuro: Cranial nerves II through XII are intact, neurovascularly intact in all extremities with 2+ DTRs and 2+ pulses.  Gait normal with good balance and coordination.  MSK: Left shoulder exam shows tightness in the parascapular region left greater than right.  Patient is tender to palpation in the parascapular region diffusely.  Negative Spurling's of the neck. Hand exam shows the patient still has the mass on the palmar aspect of the right hand. Low back exam does show some tightness noted that it is diffusely.  Tightness with FABER test left greater than right.   Osteopathic findings  T8 extended  rotated and side bent left L2 flexed rotated and side bent right Sacrum right on right     Impression and Recommendations:     The above documentation has been reviewed and is accurate and complete Lyndal Pulley, DO       Note: This dictation was prepared with Dragon dictation along with smaller phrase technology. Any transcriptional errors that result from this process are unintentional.

## 2019-08-16 NOTE — Assessment & Plan Note (Signed)
Awaiting follow-up with surgery

## 2019-08-16 NOTE — Patient Instructions (Signed)
Be careful with the fence  See me in 5-6 weeks

## 2019-08-16 NOTE — Assessment & Plan Note (Signed)
Chronic.  Mild exacerbation the patient wants to hold on any other type of medications at the moment.  Discussed posture and ergonomics, discussed avoiding certain activities.  Patient will be at likely undergoing 1 or 2 surgeries in the near future.  Follow-up with me again in 6 to 8 weeks.

## 2019-09-22 NOTE — Progress Notes (Signed)
[place] Phone: [phone] Subjective:   Shelly Green, am serving as a scribe for Dr. Hulan Saas. This visit occurred during the SARS-CoV-2 public health emergency.  Safety protocols were in place, including screening questions prior to the visit, additional usage of staff PPE, and extensive cleaning of exam room while observing appropriate contact time as indicated for disinfecting solutions.   I'm seeing this patient by the request  of:  Plotnikov, Evie Lacks, MD  CC: Low back pain and neck pain follow-up  [id number]  Shelly Green is a 59 y.o. female coming in with complaint of back and neck pain. OMT 08/16/2019. Patient states that she has not had a lot of pain as she has not been working.   Saw Dr. Amedeo Plenty. Continues to have pain in hand. Was coloring the other day and had pain. Told she didn't need surgery but does not feel a change in her pain. Feels like she needs surgery.           Reviewed prior external information including notes and imaging from previsou exam, outside providers and external EMR if available.   As well as notes that were available from care everywhere and other healthcare systems.  Past medical history, social, surgical and family history all reviewed in electronic medical record.  Green pertanent information unless stated regarding to the chief complaint.   Past Medical History:  Diagnosis Date  . Arthritis   . Cancer [facility])    breast   . History of colon polyps   . IBS (irritable bowel syndrome)   . Menopausal disorder   . Migraines   . Seasonal allergies   . Social anxiety disorder     Allergies  Allergen Reactions  . Codeine Anaphylaxis  . Hydrocodone Shortness Of Breath  . Penicillins Anaphylaxis    As a child Has patient had a PCN reaction causing immediate rash, facial/tongue/throat swelling, SOB or lightheadedness with hypotension: yes Has patient had a  PCN reaction causing severe rash involving mucus membranes or skin necrosis: unknown Has patient had a PCN reaction that required hospitalization: yes Has patient had a PCN reaction occurring within the last 10 years: Green If all of the above answers are "Green", then may proceed with Cephalosporin use.   . Sulfa Antibiotics Anaphylaxis and Rash  . Tramadol Shortness Of Breath  . Clindamycin/Lincomycin     rush     Review of Systems:  Green headache, visual changes, nausea, vomiting, diarrhea, constipation, dizziness, abdominal pain, skin rash, fevers, chills, night sweats, weight loss, swollen lymph nodes, body aches, joint swelling, chest pain, shortness of breath, mood changes. POSITIVE muscle aches  Objective  Blood pressure 118/76, pulse 67, height 5\' 2"  (1.575 m), weight 122 lb (55.3 kg), SpO2 98 %.   General: Green apparent distress alert and oriented x3 mood and affect normal, dressed appropriately.  HEENT: Pupils equal, extraocular movements intact  Respiratory: Patient's speak in full sentences and does not appear short of breath  Cardiovascular: Green lower extremity edema, non tender, Green erythema  Neuro: Cranial nerves II through XII are intact, neurovascularly intact in all extremities with 2+ DTRs and 2+ pulses.  Gait normal with good balance and coordination.  MSK: Mild arthritic changes.  Back -back exam shows some mild loss of lordosis, Green midline tenderness.  Lacks last 5 degrees of flexion and extension.  Mild tightness with FABER test.  Negative straight leg test.  Osteopathic  findings  T3 extended rotated and side bent right inhaled rib T9 extended rotated and side bent left L2 flexed rotated and side bent right Sacrum right on right       Assessment and Plan:  Chronic low back pain Chronic overall, patient is being treated recently for more of the breast cancer.  Patient has done relatively well.  X-rays have not shown anything significant we discussed advanced  imaging.  Patient feels like she would like to focus on the other surgery she has coming up.  Patient is responding well to manipulation and will follow up with me again in 6 to 8 weeks.  Social determinants of health including patient's other comorbidities including breast cancer  Ganglion cyst of joint of finger of right hand Patient did see his surgeon and they were going to hold on any type of surgical intervention at this time but now she is rethinking that and will be contacting them    Nonallopathic problems  Decision today to treat with OMT was based on Physical Exam  After verbal consent patient was treated with HVLA, ME, FPR techniques in  thoracic, lumbar, and sacral  areas  Patient tolerated the procedure well with improvement in symptoms  Patient given exercises, stretches and lifestyle modifications  See medications in patient instructions if given  Patient will follow up in 4-8 weeks      The above documentation has been reviewed and is accurate and complete Lyndal Pulley, DO       Note: This dictation was prepared with Dragon dictation along with smaller phrase technology. Any transcriptional errors that result from this process are unintentional.

## 2019-09-23 ENCOUNTER — Other Ambulatory Visit: Payer: Self-pay

## 2019-09-23 ENCOUNTER — Ambulatory Visit: Payer: Managed Care, Other (non HMO) | Admitting: Family Medicine

## 2019-09-23 ENCOUNTER — Encounter: Payer: Self-pay | Admitting: Family Medicine

## 2019-09-23 VITALS — BP 118/76 | HR 67 | Ht 62.0 in | Wt 122.0 lb

## 2019-09-23 DIAGNOSIS — M545 Low back pain: Secondary | ICD-10-CM | POA: Diagnosis not present

## 2019-09-23 DIAGNOSIS — G8929 Other chronic pain: Secondary | ICD-10-CM

## 2019-09-23 DIAGNOSIS — M999 Biomechanical lesion, unspecified: Secondary | ICD-10-CM

## 2019-09-23 DIAGNOSIS — M67441 Ganglion, right hand: Secondary | ICD-10-CM | POA: Diagnosis not present

## 2019-09-23 NOTE — Assessment & Plan Note (Signed)
Chronic overall, patient is being treated recently for more of the breast cancer.  Patient has done relatively well.  X-rays have not shown anything significant we discussed advanced imaging.  Patient feels like she would like to focus on the other surgery she has coming up.  Patient is responding well to manipulation and will follow up with me again in 6 to 8 weeks.  Social determinants of health including patient's other comorbidities including breast cancer

## 2019-09-23 NOTE — Patient Instructions (Signed)
Overall you will do fine Call surgeon about your hand See me 6 weeks after surgery

## 2019-09-23 NOTE — Assessment & Plan Note (Signed)
Patient did see his surgeon and they were going to hold on any type of surgical intervention at this time but now she is rethinking that and will be contacting them

## 2019-11-11 ENCOUNTER — Telehealth: Payer: Self-pay | Admitting: Internal Medicine

## 2019-11-11 ENCOUNTER — Other Ambulatory Visit: Payer: Self-pay

## 2019-11-11 ENCOUNTER — Ambulatory Visit: Payer: Managed Care, Other (non HMO) | Admitting: Internal Medicine

## 2019-11-11 ENCOUNTER — Encounter: Payer: Self-pay | Admitting: Internal Medicine

## 2019-11-11 DIAGNOSIS — F988 Other specified behavioral and emotional disorders with onset usually occurring in childhood and adolescence: Secondary | ICD-10-CM | POA: Diagnosis not present

## 2019-11-11 DIAGNOSIS — F419 Anxiety disorder, unspecified: Secondary | ICD-10-CM

## 2019-11-11 DIAGNOSIS — G43109 Migraine with aura, not intractable, without status migrainosus: Secondary | ICD-10-CM | POA: Diagnosis not present

## 2019-11-11 MED ORDER — AMPHETAMINE-DEXTROAMPHET ER 30 MG PO CP24: 30.0000 mg | ORAL_CAPSULE | ORAL | 0 refills | Status: DC

## 2019-11-11 MED ORDER — BUSPIRONE HCL 30 MG PO TABS: 30.0000 mg | ORAL_TABLET | Freq: Two times a day (BID) | ORAL | 5 refills | Status: DC

## 2019-11-11 MED ORDER — AMPHETAMINE-DEXTROAMPHET ER 30 MG PO CP24: 30.0000 mg | ORAL_CAPSULE | Freq: Every day | ORAL | 0 refills | Status: DC

## 2019-11-11 MED ORDER — LORAZEPAM 0.5 MG PO TABS: 0.5000 mg | ORAL_TABLET | Freq: Two times a day (BID) | ORAL | 2 refills | Status: DC | PRN

## 2019-11-11 NOTE — Assessment & Plan Note (Signed)
On Adderall

## 2019-11-11 NOTE — Progress Notes (Signed)
Subjective:  Patient ID: Shelly Green, female    DOB: 22-Feb-1960  Age: 59 y.o. MRN: [id number]  CC: Follow-up   HPI ARDELLA CHHIM presents for ADD, smoking - 5 cigs/d - trying to quit C/o anxiety, depression   Outpatient Medications Prior to Visit  Medication Sig Dispense Refill  . amphetamine-dextroamphetamine (ADDERALL XR) 30 MG 24 hr capsule Take 1 capsule (30 mg total) by mouth daily. 30 capsule 0  . amphetamine-dextroamphetamine (ADDERALL XR) 30 MG 24 hr capsule Take 1 capsule (30 mg total) by mouth every morning. 30 capsule 0  . amphetamine-dextroamphetamine (ADDERALL XR) 30 MG 24 hr capsule Take 1 capsule (30 mg total) by mouth every morning. 30 capsule 0  . busPIRone (BUSPAR) 7.5 MG tablet Take 2 tablets (15 mg total) by mouth 2 (two) times daily. 360 tablet 1  . butalbital-acetaminophen-caffeine (FIORICET) 50-325-40 MG tablet Take 2 tablets by mouth every 6 (six) hours as needed. 40 tablet 1  . Cholecalciferol (VITAMIN D3) 50 MCG (2000 UT) capsule Take 1 capsule (2,000 Units total) by mouth daily. 100 capsule 3  . diphenhydrAMINE (BENADRYL) 25 mg capsule Take 1 capsule (25 mg total) by mouth every 6 (six) hours as needed for itching or sleep. 10 capsule 0  . fluticasone (FLONASE) 50 MCG/ACT nasal spray Place 2 sprays into both nostrils daily. 16 g 6  . letrozole (FEMARA) 2.5 MG tablet Take 2.5 mg by mouth daily.    Marland Kitchen loratadine (CLARITIN) 10 MG tablet Take 10 mg by mouth daily.    . Multiple Vitamin (MULTIVITAMIN WITH MINERALS) TABS tablet Take 1 tablet by mouth 2 (two) times daily.    Marland Kitchen saccharomyces boulardii (FLORASTOR) 250 MG capsule Take 1 capsule (250 mg total) by mouth 2 (two) times daily. 30 capsule 0  . SUMAtriptan (IMITREX) 100 MG tablet TAKE 1 TAB BY MOUTH DAILY AS NEEDED FOR MIGRAINE MAY REPEAT IN 2HOURS IF HEADACHE PERSISTS OR RECURS 12 tablet 3   No facility-administered medications prior to visit.    ROS: Review of Systems  Constitutional: Negative for  activity change, appetite change, chills, fatigue and unexpected weight change.  HENT: Negative for congestion, mouth sores and sinus pressure.   Eyes: Negative for visual disturbance.  Respiratory: Negative for cough and chest tightness.   Gastrointestinal: Negative for abdominal pain and nausea.  Genitourinary: Negative for difficulty urinating, frequency and vaginal pain.  Musculoskeletal: Negative for back pain and gait problem.  Skin: Negative for pallor and rash.  Neurological: Negative for dizziness, tremors, weakness, numbness and headaches.  Psychiatric/Behavioral: Positive for sleep disturbance. Negative for confusion. The patient is nervous/anxious.     Objective:  BP (!) 140/98 (BP Location: Right Arm, Patient Position: Sitting, Cuff Size: Large)   Pulse 79   Temp 98.3 F (36.8 C) (Oral)   Ht 5\' 2"  (1.575 m)   Wt 123 lb 9.6 oz (56.1 kg)   SpO2 97%   BMI 22.61 kg/m   BP Readings from Last 3 Encounters:  11/11/19 (!) 140/98  09/23/19 118/76  08/16/19 122/70    Wt Readings from Last 3 Encounters:  11/11/19 123 lb 9.6 oz (56.1 kg)  09/23/19 122 lb (55.3 kg)  08/16/19 120 lb (54.4 kg)    Physical Exam Constitutional:      General: She is not in acute distress.    Appearance: She is well-developed.  HENT:     Head: Normocephalic.     Right Ear: External ear normal.     Left Ear:  External ear normal.     Nose: Nose normal.  Eyes:     General:        Right eye: No discharge.        Left eye: No discharge.     Conjunctiva/sclera: Conjunctivae normal.     Pupils: Pupils are equal, round, and reactive to light.  Neck:     Thyroid: No thyromegaly.     Vascular: No JVD.     Trachea: No tracheal deviation.  Cardiovascular:     Rate and Rhythm: Normal rate and regular rhythm.     Heart sounds: Normal heart sounds.  Pulmonary:     Effort: No respiratory distress.     Breath sounds: No stridor. No wheezing.  Abdominal:     General: Bowel sounds are normal.  There is no distension.     Palpations: Abdomen is soft. There is no mass.     Tenderness: There is no abdominal tenderness. There is no guarding or rebound.  Musculoskeletal:        General: No tenderness.     Cervical back: Normal range of motion and neck supple.  Lymphadenopathy:     Cervical: No cervical adenopathy.  Skin:    Findings: No erythema or rash.  Neurological:     Mental Status: She is oriented to person, place, and time.     Cranial Nerves: No cranial nerve deficit.     Motor: No abnormal muscle tone.     Coordination: Coordination normal.     Deep Tendon Reflexes: Reflexes normal.  Psychiatric:        Behavior: Behavior normal.        Thought Content: Thought content normal.        Judgment: Judgment normal.     Lab Results  Component Value Date   WBC 8.3 12/29/2018   HGB 14.5 12/29/2018   HCT 43.3 12/29/2018   PLT 171.0 12/29/2018   GLUCOSE 114 (H) 12/29/2018   CHOL 145 12/29/2018   TRIG 62.0 12/29/2018   HDL 47.70 12/29/2018   LDLCALC 85 12/29/2018   ALT 13 12/29/2018   AST 15 12/29/2018   NA 142 12/29/2018   K 3.4 (L) 12/29/2018   CL 108 12/29/2018   CREATININE 0.65 12/29/2018   BUN 18 12/29/2018   CO2 27 12/29/2018   TSH 2.64 12/29/2018   INR 1.02 09/28/2014    CT CARDIAC SCORING  Addendum Date: 01/20/2019   ADDENDUM REPORT: 01/20/2019 08:46 CLINICAL DATA:  Risk stratification EXAM: Coronary Calcium Score TECHNIQUE: The patient was scanned on a [employer] scanner. Axial non-contrast 3 mm slices were carried out through the heart. The data set was analyzed on a dedicated work station and scored using the [place]. FINDINGS: Non-cardiac: See separate report from [facility]. Ascending Aorta: Normal caliber. Pericardium: Normal. Coronary arteries: Normal origins. IMPRESSION: Coronary calcium score of 0. Eleonore Chiquito, MD Electronically Signed   By: Eleonore Chiquito   On: 01/20/2019 08:46   Result Date: 01/20/2019 EXAM: OVER-READ  INTERPRETATION  CT CHEST The following report is an over-read performed by radiologist Dr. Rolm Baptise of [facility], [place] on 01/20/2019. This over-read does not include interpretation of cardiac or coronary anatomy or pathology. The coronary calcium score interpretation by the cardiologist is attached. COMPARISON:  03/31/2007. FINDINGS: Vascular: Heart is normal size.  Visualized aorta normal caliber. Mediastinum/Nodes: No adenopathy in the lower mediastinum or hila. Lungs/Pleura: Right middle lobe nodule measures 4 mm on image 14. This is stable since  2009 compatible with benign nodule. Lingular nodule on the same image measures 2-3 mm and is stable since 2009. No confluent opacities or effusions. Upper Abdomen: Imaging into the upper abdomen shows no acute findings. Musculoskeletal: Bilateral breast implants. Chest wall soft tissues are unremarkable otherwise. No acute bony abnormality. IMPRESSION: Small bilateral pulmonary nodules are stable since 2009 compatible with benign nodules. No acute extracardiac findings or significant abnormality. Electronically Signed: By: Rolm Baptise M.D. On: 01/20/2019 08:43    Assessment & Plan:    Walker Kehr, MD

## 2019-11-11 NOTE — Telephone Encounter (Signed)
    Please call pharmacy to confirm start date of 10/21 for amphetamine-dextroamphetamine (ADDERALL XR) 30 MG 24 hr capsule Patient going to the beach 10[place]23, pharmacy will not refill until 10/24 without a call from staff

## 2019-11-11 NOTE — Assessment & Plan Note (Addendum)
Worse Increase Buspar Lorazepam prn rare Reduce Adderall if needed

## 2019-11-11 NOTE — Assessment & Plan Note (Signed)
Fioricet prn severe HA

## 2019-11-19 ENCOUNTER — Encounter: Payer: Self-pay | Admitting: Family Medicine

## 2019-11-19 ENCOUNTER — Ambulatory Visit: Payer: Managed Care, Other (non HMO) | Admitting: Family Medicine

## 2019-11-19 ENCOUNTER — Other Ambulatory Visit: Payer: Self-pay

## 2019-11-19 VITALS — BP 122/84 | HR 82 | Ht 62.0 in | Wt 126.0 lb

## 2019-11-19 DIAGNOSIS — M999 Biomechanical lesion, unspecified: Secondary | ICD-10-CM | POA: Diagnosis not present

## 2019-11-19 DIAGNOSIS — G8929 Other chronic pain: Secondary | ICD-10-CM

## 2019-11-19 DIAGNOSIS — M545 Low back pain, unspecified: Secondary | ICD-10-CM | POA: Diagnosis not present

## 2019-11-19 NOTE — Assessment & Plan Note (Signed)
Multifactorial.  Patient is doing fairly well though with osteopathic manipulation.  Chronic problem with mild exacerbation.  Patient is recently just had the breast reconstructive surgery 6 weeks ago and is doing well.  Patient now has been sleeping on the couch secondary to positioning.  Likely contributing to some more of the tightness.  Discussed which activities to do which wants to avoid.  Increase activity slowly.  Follow-up again in  8 weeks

## 2019-11-19 NOTE — Patient Instructions (Signed)
Doing well See me in 8 weeks

## 2019-11-19 NOTE — Progress Notes (Signed)
Shelly [name] Shelly [name] Shelly [name] Phone: [phone] Subjective:   Shelly [name], am serving as a scribe for Dr. Hulan Saas. This visit occurred during the SARS-CoV-2 public health emergency.  Safety protocols were in place, including screening questions prior to the visit, additional usage of staff PPE, and extensive cleaning of exam room while observing appropriate contact time as indicated for disinfecting solutions.   I'm seeing this patient by the request  of:  Plotnikov, Evie Lacks, MD  CC: Low back pain follow-up  [id number]  Shelly [name] is a 59 y.o. female coming in with complaint of back and neck pain. OMT 09/23/2019. Patient states that her back pain has increased as she cannot sleep on her side.  Patient has had to sleep on the couch more frequently.  Not using any medications though for it.  Patient has noticed some discomfort but seems to be more tightness but when she gets moving seems to do better.  Still on significant lifting restrictions which has helped her back somewhat.         Reviewed prior external information including notes and imaging from previsou exam, outside providers and external EMR if available.   As well as notes that were available from care everywhere and other healthcare systems.  Past medical history, social, surgical and family history all reviewed in electronic medical record.  [name] pertanent information unless stated regarding to the chief complaint.   Past Medical History:  Diagnosis Date  . Arthritis   . Cancer [facility])    breast   . History of colon polyps   . IBS (irritable bowel syndrome)   . Menopausal disorder   . Migraines   . Seasonal allergies   . Social anxiety disorder     Allergies  Allergen Reactions  . Codeine Anaphylaxis  . Hydrocodone Shortness Of Breath  . Penicillins Anaphylaxis    As a child Has patient had a PCN reaction causing immediate rash,  facial/tongue/throat swelling, SOB or lightheadedness with hypotension: yes Has patient had a PCN reaction causing severe rash involving mucus membranes or skin necrosis: unknown Has patient had a PCN reaction that required hospitalization: yes Has patient had a PCN reaction occurring within the last 10 years: [name] If all of the above answers are "[name]", then may proceed with Cephalosporin use.   . Sulfa Antibiotics Anaphylaxis and Rash  . Tramadol Shortness Of Breath  . Clindamycin/Lincomycin     rush     Review of Systems:  [name] headache, visual changes, nausea, vomiting, diarrhea, constipation, dizziness, abdominal pain, skin rash, fevers, chills, night sweats, weight loss, swollen lymph nodes, body aches, joint swelling, chest pain, shortness of breath, mood changes. POSITIVE muscle aches  Objective  Blood pressure 122/84, pulse 82, height 5\' 2"  (1.575 m), weight 126 lb (57.2 kg), SpO2 99 %.   General: [name] apparent distress alert and oriented x3 mood and affect normal, dressed appropriately.  HEENT: Pupils equal, extraocular movements intact  Respiratory: Patient's speak in full sentences and does not appear short of breath  Cardiovascular: [name] lower extremity edema, non tender, [name] erythema  Neuro: Cranial nerves II through XII are intact, neurovascularly intact in all extremities with 2+ DTRs and 2+ pulses.  Gait normal with good balance and coordination.  MSK:  Non tender with full range of motion and good stability and symmetric strength and tone of shoulders, elbows, wrist, hip, knee and ankles bilaterally.  Back -low back exam shows  some mild loss of lordosis.  Some tenderness to palpation in the paraspinal musculature mostly in the thoracolumbar spine and the lumbosacral area.  Tightness noted with FABER test right greater than left.  Osteopathic findings  T9 extended rotated and side bent left L3 flexed rotated and side bent right Sacrum right on right       Assessment and  Plan:  Chronic low back pain Multifactorial.  Patient is doing fairly well though with osteopathic manipulation.  Chronic problem with mild exacerbation.  Patient is recently just had the breast reconstructive surgery 6 weeks ago and is doing well.  Patient now has been sleeping on the couch secondary to positioning.  Likely contributing to some more of the tightness.  Discussed which activities to do which wants to avoid.  Increase activity slowly.  Follow-up again in  8 weeks   Nonallopathic problems  Decision today to treat with OMT was based on Physical Exam  After verbal consent patient was treated with HVLA, ME, FPR techniques in  thoracic, lumbar, and sacral  areas  Patient tolerated the procedure well with improvement in symptoms  Patient given exercises, stretches and lifestyle modifications  See medications in patient instructions if given  Patient will follow up in 8 weeks      The above documentation has been reviewed and is accurate and complete Lyndal Pulley, DO       Note: This dictation was prepared with Dragon dictation along with smaller phrase technology. Any transcriptional errors that result from this process are unintentional.

## 2019-12-04 ENCOUNTER — Other Ambulatory Visit: Payer: Self-pay | Admitting: Internal Medicine

## 2019-12-15 DIAGNOSIS — M6530 Trigger finger, unspecified finger: Secondary | ICD-10-CM | POA: Insufficient documentation

## 2020-01-12 ENCOUNTER — Other Ambulatory Visit: Payer: Self-pay | Admitting: Internal Medicine

## 2020-01-18 ENCOUNTER — Ambulatory Visit (INDEPENDENT_AMBULATORY_CARE_PROVIDER_SITE_OTHER): Payer: Managed Care, Other (non HMO)

## 2020-01-18 ENCOUNTER — Encounter: Payer: Self-pay | Admitting: Family Medicine

## 2020-01-18 ENCOUNTER — Other Ambulatory Visit: Payer: Self-pay

## 2020-01-18 ENCOUNTER — Ambulatory Visit: Payer: Managed Care, Other (non HMO) | Admitting: Family Medicine

## 2020-01-18 VITALS — BP 122/92 | HR 80 | Ht 62.0 in | Wt 125.0 lb

## 2020-01-18 DIAGNOSIS — Q782 Osteopetrosis: Secondary | ICD-10-CM | POA: Diagnosis not present

## 2020-01-18 DIAGNOSIS — M545 Low back pain, unspecified: Secondary | ICD-10-CM | POA: Diagnosis not present

## 2020-01-18 DIAGNOSIS — M999 Biomechanical lesion, unspecified: Secondary | ICD-10-CM | POA: Diagnosis not present

## 2020-01-18 DIAGNOSIS — G8929 Other chronic pain: Secondary | ICD-10-CM

## 2020-01-18 NOTE — Assessment & Plan Note (Signed)
Patient has had some back pain.  Has been a little more stress recently.  Does have a history of osteoporosis as well as breast cancer.  X-rays ordered today.  Patient has been fairly noncompliant though with the rest of the exercises.  Patient has had a lot of life stressors including a loss of a family member, childhood friend, and the stress of the holidays.  Encourage patient to monitor this little closer.  No fevers chills or any abnormal weight loss.  Patient wants to continue with conservative therapy.  Attempted osteopathic manipulation and did muscle energy.  Follow-up with me again 7 to 8 weeks.  Worsening pain do need to consider MRI or physical therapy.

## 2020-01-18 NOTE — Assessment & Plan Note (Signed)
History of osteoporosis.  Patient was to be on Boniva.  Patient has been getting mixed signals and has been avoiding taking it at this time secondary to some issues with her chemotherapy as well as what she has been told by her dentist.  We discussed the possibility of Prolia.  Patient will talk to primary care provider as well is her oncologist.

## 2020-01-18 NOTE — Patient Instructions (Addendum)
Xray today Sorry for everything going on Try to do exercises more regularly Talk to other doc about Prolia See me in 6-7 weeks if not better will do PT and/or MRI

## 2020-01-18 NOTE — Progress Notes (Signed)
[facility] [address] [place] [address] Phone: [phone] Subjective:    I'm seeing this patient by the request  of:  Plotnikov, Georgina Quint, MD  CC: Low back and neck pain follow-up  [id number]  Shelly Green is a 59 y.o. female coming in with complaint of back and neck pain. OMT 11/19/2019. Patient states that she has had some flares despite not lifting due to another surgery.  Patient states that been not quite as active seems to have aggravated it somewhat.  Has had some mild increase in discomfort mostly of the chronic lower back.  Patient is wanting to be more active but finding it difficult.  Medications patient has been prescribed: None  Taking: Patient tries to avoid over-the-counter medicines secondary to her other medications.         Reviewed prior external information including notes and imaging from previsou exam, outside providers and external EMR if available.   As well as notes that were available from care everywhere and other healthcare systems.  Past medical history, social, surgical and family history all reviewed in electronic medical record.  No pertanent information unless stated regarding to the chief complaint.   Past Medical History:  Diagnosis Date  . Arthritis   . Cancer [facility])    breast   . History of colon polyps   . IBS (irritable bowel syndrome)   . Menopausal disorder   . Migraines   . Seasonal allergies   . Social anxiety disorder     Allergies  Allergen Reactions  . Codeine Anaphylaxis  . Hydrocodone Shortness Of Breath  . Penicillins Anaphylaxis    As a child Has patient had a PCN reaction causing immediate rash, facial/tongue/throat swelling, SOB or lightheadedness with hypotension: yes Has patient had a PCN reaction causing severe rash involving mucus membranes or skin necrosis: unknown Has patient had a PCN reaction that required hospitalization: yes Has patient had a PCN reaction  occurring within the last 10 years: no If all of the above answers are "NO", then may proceed with Cephalosporin use.   . Sulfa Antibiotics Anaphylaxis and Rash  . Tramadol Shortness Of Breath  . Clindamycin/Lincomycin     rush     Review of Systems:  No headache, visual changes, nausea, vomiting, diarrhea, constipation, dizziness, abdominal pain, skin rash, fevers, chills, night sweats, weight loss, swollen lymph nodes, body aches, joint swelling, chest pain, shortness of breath, mood changes. POSITIVE muscle aches  Objective  Blood pressure (!) 122/92, pulse 80, height 5\' 2"  (1.575 m), weight 125 lb (56.7 kg), SpO2 98 %.   General: No apparent distress alert and oriented x3 mood and affect normal, dressed appropriately.  HEENT: Pupils equal, extraocular movements intact  Respiratory: Patient's speak in full sentences and does not appear short of breath  Cardiovascular: No lower extremity edema, non tender, no erythema  Neuro: Cranial nerves II through XII are intact, neurovascularly intact in all extremities with 2+ DTRs and 2+ pulses.  Gait normal with good balance and coordination.  MSK:  Non tender with full range of motion and good stability and symmetric strength and tone of shoulders, elbows, wrist, hip, knee and ankles bilaterally.  Back -low back exam.  Patient does have actually some mild loss of lordosis.  Tightness noted in the thoracolumbar juncture.  No spinous process tenderness.  Lacks last 5 degrees of extension.  Tightness with FABER test right greater than left  Osteopathic findings  T8 extended rotated  and side bent left L2 flexed rotated and side bent right Sacrum right on right       Assessment and Plan: Chronic low back pain Patient has had some back pain.  Has been a little more stress recently.  Does have a history of osteoporosis as well as breast cancer.  X-rays ordered today.  Patient has been fairly noncompliant though with the rest of the exercises.   Patient has had a lot of life stressors including a loss of a family member, childhood friend, and the stress of the holidays.  Encourage patient to monitor this little closer.  No fevers chills or any abnormal weight loss.  Patient wants to continue with conservative therapy.  Attempted osteopathic manipulation and did muscle energy.  Follow-up with me again 7 to 8 weeks.  Worsening pain do need to consider MRI or physical therapy.  Osteopetrosis History of osteoporosis.  Patient was to be on Boniva.  Patient has been getting mixed signals and has been avoiding taking it at this time secondary to some issues with her chemotherapy as well as what she has been told by her dentist.  We discussed the possibility of Prolia.  Patient will talk to primary care provider as well is her oncologist.     Nonallopathic problems  Decision today to treat with OMT was based on Physical Exam  After verbal consent patient was treated with  ME, FPR techniques in  thoracic, lumbar, and sacral  areas avoided HVLA today  Patient tolerated the procedure well with improvement in symptoms  Patient given exercises, stretches and lifestyle modifications  See medications in patient instructions if given  Patient will follow up in 4-8 weeks      The above documentation has been reviewed and is accurate and complete Lyndal Pulley, DO       Note: This dictation was prepared with Dragon dictation along with smaller phrase technology. Any transcriptional errors that result from this process are unintentional.

## 2020-02-15 ENCOUNTER — Other Ambulatory Visit: Payer: Self-pay

## 2020-02-15 ENCOUNTER — Ambulatory Visit (INDEPENDENT_AMBULATORY_CARE_PROVIDER_SITE_OTHER): Payer: 59 | Admitting: Internal Medicine

## 2020-02-15 ENCOUNTER — Encounter: Payer: Self-pay | Admitting: Internal Medicine

## 2020-02-15 DIAGNOSIS — C50919 Malignant neoplasm of unspecified site of unspecified female breast: Secondary | ICD-10-CM | POA: Diagnosis not present

## 2020-02-15 DIAGNOSIS — G43109 Migraine with aura, not intractable, without status migrainosus: Secondary | ICD-10-CM

## 2020-02-15 DIAGNOSIS — F419 Anxiety disorder, unspecified: Secondary | ICD-10-CM | POA: Diagnosis not present

## 2020-02-15 DIAGNOSIS — F988 Other specified behavioral and emotional disorders with onset usually occurring in childhood and adolescence: Secondary | ICD-10-CM

## 2020-02-15 MED ORDER — AMPHETAMINE-DEXTROAMPHET ER 30 MG PO CP24: 30.0000 mg | ORAL_CAPSULE | ORAL | 0 refills | Status: DC

## 2020-02-15 MED ORDER — AMPHETAMINE-DEXTROAMPHET ER 30 MG PO CP24: 30.0000 mg | ORAL_CAPSULE | Freq: Every day | ORAL | 0 refills | Status: DC

## 2020-02-15 MED ORDER — BUPROPION HCL ER (XL) 150 MG PO TB24: 150.0000 mg | ORAL_TABLET | Freq: Every day | ORAL | 1 refills | Status: DC

## 2020-02-15 NOTE — Assessment & Plan Note (Signed)
On Adderall - doing ok Start  Wellbutrin XL, stop smoking

## 2020-02-15 NOTE — Assessment & Plan Note (Signed)
Adderall Rx  Potential benefits of a long term opioids use as well as potential risks (i.e. addiction risk, apnea etc) and complications (i.e. Somnolence, constipation and others) were explained to the patient and were aknowledged.  

## 2020-02-15 NOTE — Progress Notes (Signed)
Subjective:  Patient ID: Shelly Green, female    DOB: 20-May-1960  Age: 60 y.o. MRN: [id number]  CC: Follow-up (3 months f/u)   HPI REILEY UPRIGHT presents for ADD, anxiety, depression Needs to stop smoking - Wellbutrin helped in the past  Outpatient Medications Prior to Visit  Medication Sig Dispense Refill  . amphetamine-dextroamphetamine (ADDERALL XR) 30 MG 24 hr capsule Take 1 capsule (30 mg total) by mouth every morning. 30 capsule 0  . busPIRone (BUSPAR) 30 MG tablet TAKE 1 TABLET BY MOUTH 2 TIMES DAILY. 180 tablet 1  . butalbital-acetaminophen-caffeine (FIORICET) 50-325-40 MG tablet TAKE 2 TABLETS BY MOUTH EVERY 6 (SIX) HOURS AS NEEDED. 40 tablet 1  . Cholecalciferol (VITAMIN D3) 50 MCG (2000 UT) capsule Take 1 capsule (2,000 Units total) by mouth daily. 100 capsule 3  . fluticasone (FLONASE) 50 MCG/ACT nasal spray Place 2 sprays into both nostrils daily. 16 g 6  . letrozole (FEMARA) 2.5 MG tablet Take 2.5 mg by mouth daily.    . Multiple Vitamin (MULTIVITAMIN WITH MINERALS) TABS tablet Take 1 tablet by mouth 2 (two) times daily.    Marland Kitchen LORazepam (ATIVAN) 0.5 MG tablet Take 1 tablet (0.5 mg total) by mouth 2 (two) times daily as needed for anxiety. 30 tablet 2  . SUMAtriptan (IMITREX) 100 MG tablet TAKE 1 TAB BY MOUTH DAILY AS NEEDED FOR MIGRAINE MAY REPEAT IN 2HOURS IF HEADACHE PERSISTS OR RECURS 12 tablet 3  . amphetamine-dextroamphetamine (ADDERALL XR) 30 MG 24 hr capsule Take 1 capsule (30 mg total) by mouth daily. 30 capsule 0  . amphetamine-dextroamphetamine (ADDERALL XR) 30 MG 24 hr capsule Take 1 capsule (30 mg total) by mouth every morning. 30 capsule 0  . diphenhydrAMINE (BENADRYL) 25 mg capsule Take 1 capsule (25 mg total) by mouth every 6 (six) hours as needed for itching or sleep. (Patient not taking: Reported on 02/15/2020) 10 capsule 0  . loratadine (CLARITIN) 10 MG tablet Take 10 mg by mouth daily. (Patient not taking: Reported on 02/15/2020)    . saccharomyces  boulardii (FLORASTOR) 250 MG capsule Take 1 capsule (250 mg total) by mouth 2 (two) times daily. (Patient not taking: Reported on 02/15/2020) 30 capsule 0   No facility-administered medications prior to visit.    ROS: Review of Systems  Constitutional: Positive for fatigue. Negative for activity change, appetite change, chills and unexpected weight change.  HENT: Negative for congestion, mouth sores and sinus pressure.   Eyes: Negative for visual disturbance.  Respiratory: Negative for cough and chest tightness.   Gastrointestinal: Negative for abdominal pain and nausea.  Genitourinary: Negative for difficulty urinating, frequency and vaginal pain.  Musculoskeletal: Negative for back pain and gait problem.  Skin: Negative for pallor and rash.  Neurological: Negative for dizziness, tremors, weakness, numbness and headaches.  Psychiatric/Behavioral: Positive for decreased concentration and dysphoric mood. Negative for confusion and sleep disturbance. The patient is nervous/anxious.     Objective:  BP 122/70 (BP Location: Left Arm)   Pulse 73   Temp 98.2 F (36.8 C) (Oral)   Wt 125 lb 6.4 oz (56.9 kg)   SpO2 96%   BMI 22.94 kg/m   BP Readings from Last 3 Encounters:  02/15/20 122/70  01/18/20 (!) 122/92  11/19/19 122/84    Wt Readings from Last 3 Encounters:  02/15/20 125 lb 6.4 oz (56.9 kg)  01/18/20 125 lb (56.7 kg)  11/19/19 126 lb (57.2 kg)    Physical Exam Constitutional:  General: She is not in acute distress.    Appearance: She is well-developed.  HENT:     Head: Normocephalic.     Right Ear: External ear normal.     Left Ear: External ear normal.     Nose: Nose normal.     Mouth/Throat:     Mouth: Oropharynx is clear and moist.  Eyes:     General:        Right eye: No discharge.        Left eye: No discharge.     Conjunctiva/sclera: Conjunctivae normal.     Pupils: Pupils are equal, round, and reactive to light.  Neck:     Thyroid: No thyromegaly.      Vascular: No JVD.     Trachea: No tracheal deviation.  Cardiovascular:     Rate and Rhythm: Normal rate and regular rhythm.     Heart sounds: Normal heart sounds.  Pulmonary:     Effort: No respiratory distress.     Breath sounds: No stridor. No wheezing.  Abdominal:     General: Bowel sounds are normal. There is no distension.     Palpations: Abdomen is soft. There is no mass.     Tenderness: There is no abdominal tenderness. There is no guarding or rebound.  Musculoskeletal:        General: No tenderness or edema.     Cervical back: Normal range of motion and neck supple.  Lymphadenopathy:     Cervical: No cervical adenopathy.  Skin:    Findings: No erythema or rash.  Neurological:     Cranial Nerves: No cranial nerve deficit.     Motor: No abnormal muscle tone.     Coordination: Coordination normal.     Deep Tendon Reflexes: Reflexes normal.  Psychiatric:        Mood and Affect: Mood and affect normal.        Behavior: Behavior normal.        Thought Content: Thought content normal.        Judgment: Judgment normal.     Lab Results  Component Value Date   WBC 8.3 12/29/2018   HGB 14.5 12/29/2018   HCT 43.3 12/29/2018   PLT 171.0 12/29/2018   GLUCOSE 114 (H) 12/29/2018   CHOL 145 12/29/2018   TRIG 62.0 12/29/2018   HDL 47.70 12/29/2018   LDLCALC 85 12/29/2018   ALT 13 12/29/2018   AST 15 12/29/2018   NA 142 12/29/2018   K 3.4 (L) 12/29/2018   CL 108 12/29/2018   CREATININE 0.65 12/29/2018   BUN 18 12/29/2018   CO2 27 12/29/2018   TSH 2.64 12/29/2018   INR 1.02 09/28/2014    CT CARDIAC SCORING  Addendum Date: 01/20/2019   ADDENDUM REPORT: 01/20/2019 08:46 CLINICAL DATA:  Risk stratification EXAM: Coronary Calcium Score TECHNIQUE: The patient was scanned on a [employer] scanner. Axial non-contrast 3 mm slices were carried out through the heart. The data set was analyzed on a dedicated work station and scored using the [place]. FINDINGS:  Non-cardiac: See separate report from [facility]. Ascending Aorta: Normal caliber. Pericardium: Normal. Coronary arteries: Normal origins. IMPRESSION: Coronary calcium score of 0. Eleonore Chiquito, MD Electronically Signed   By: Eleonore Chiquito   On: 01/20/2019 08:46   Result Date: 01/20/2019 EXAM: OVER-READ INTERPRETATION  CT CHEST The following report is an over-read performed by radiologist Dr. Rolm Baptise of [facility], [place] on 01/20/2019. This over-read does not include interpretation of  cardiac or coronary anatomy or pathology. The coronary calcium score interpretation by the cardiologist is attached. COMPARISON:  03/31/2007. FINDINGS: Vascular: Heart is normal size.  Visualized aorta normal caliber. Mediastinum/Nodes: No adenopathy in the lower mediastinum or hila. Lungs/Pleura: Right middle lobe nodule measures 4 mm on image 14. This is stable since 2009 compatible with benign nodule. Lingular nodule on the same image measures 2-3 mm and is stable since 2009. No confluent opacities or effusions. Upper Abdomen: Imaging into the upper abdomen shows no acute findings. Musculoskeletal: Bilateral breast implants. Chest wall soft tissues are unremarkable otherwise. No acute bony abnormality. IMPRESSION: Small bilateral pulmonary nodules are stable since 2009 compatible with benign nodules. No acute extracardiac findings or significant abnormality. Electronically Signed: By: Rolm Baptise M.D. On: 01/20/2019 08:43    Assessment & Plan:    Walker Kehr, MD

## 2020-02-15 NOTE — Assessment & Plan Note (Signed)
Fioricet prn severe HA 

## 2020-02-15 NOTE — Assessment & Plan Note (Addendum)
Still off work On [employer]

## 2020-02-28 NOTE — Progress Notes (Deleted)
[place] Phone: [phone] Subjective:    I'm seeing this patient by the request  of:  Plotnikov, Evie Lacks, MD  CC: Low back and neck pain follow-up  [id number]  Shelly Green is a 60 y.o. female coming in with complaint of back and neck pain.OMT 01/18/2020.  Patient was found to have more of osteoporosis, and chronic low back pain.  Patient states   Medications patient has been prescribed: None  Taking:      X-rays of the lumbar spine were taken January 18, 2020.  These were independently visualized by me showing very mild scoliosis and mild multilevel diffuse degenerative changes of the lumbar spine   Reviewed prior external information including notes and imaging from previsou exam, outside providers and external EMR if available.   As well as notes that were available from care everywhere and other healthcare systems.  Past medical history, social, surgical and family history all reviewed in electronic medical record.  No pertanent information unless stated regarding to the chief complaint.   Past Medical History:  Diagnosis Date  . Arthritis   . Cancer [facility]    breast   . History of colon polyps   . IBS (irritable bowel syndrome)   . Menopausal disorder   . Migraines   . Seasonal allergies   . Social anxiety disorder     Allergies  Allergen Reactions  . Codeine Anaphylaxis  . Hydrocodone Shortness Of Breath  . Penicillins Anaphylaxis    As a child Has patient had a PCN reaction causing immediate rash, facial/tongue/throat swelling, SOB or lightheadedness with hypotension: yes Has patient had a PCN reaction causing severe rash involving mucus membranes or skin necrosis: unknown Has patient had a PCN reaction that required hospitalization: yes Has patient had a PCN reaction occurring within the last 10 years: no If all of the above answers are "NO", then may proceed with  Cephalosporin use.   . Sulfa Antibiotics Anaphylaxis and Rash  . Tramadol Shortness Of Breath  . Clindamycin/Lincomycin     rush     Review of Systems:  No headache, visual changes, nausea, vomiting, diarrhea, constipation, dizziness, abdominal pain, skin rash, fevers, chills, night sweats, weight loss, swollen lymph nodes, body aches, joint swelling, chest pain, shortness of breath, mood changes. POSITIVE muscle aches  Objective  There were no vitals taken for this visit.   General: No apparent distress alert and oriented x3 mood and affect normal, dressed appropriately.  HEENT: Pupils equal, extraocular movements intact  Respiratory: Patient's speak in full sentences and does not appear short of breath  Cardiovascular: No lower extremity edema, non tender, no erythema  Neuro: Cranial nerves II through XII are intact, neurovascularly intact in all extremities with 2+ DTRs and 2+ pulses.  Gait normal with good balance and coordination.  MSK:  Non tender with full range of motion and good stability and symmetric strength and tone of shoulders, elbows, wrist, hip, knee and ankles bilaterally.  Back - Normal skin, Spine with normal alignment and no deformity.  No tenderness to vertebral process palpation.  Paraspinous muscles are not tender and without spasm.   Range of motion is full at neck and lumbar sacral regions  Osteopathic findings  C2 flexed rotated and side bent right C6 flexed rotated and side bent left T3 extended rotated and side bent right inhaled rib T9 extended rotated and side bent left L2 flexed rotated and side bent  right Sacrum right on right       Assessment and Plan:    Nonallopathic problems  Decision today to treat with OMT was based on Physical Exam  After verbal consent patient was treated with HVLA, ME, FPR techniques in  thoracic, lumbar, and sacral  areas  Patient tolerated the procedure well with improvement in symptoms  Patient given  exercises, stretches and lifestyle modifications  See medications in patient instructions if given  Patient will follow up in 4-8 weeks      The above documentation has been reviewed and is accurate and complete Lyndal Pulley, DO       Note: This dictation was prepared with Dragon dictation along with smaller phrase technology. Any transcriptional errors that result from this process are unintentional.

## 2020-02-29 ENCOUNTER — Ambulatory Visit: Payer: Managed Care, Other (non HMO) | Admitting: Family Medicine

## 2020-03-25 NOTE — Progress Notes (Signed)
[place] [address] [place] Phone: [phone] Subjective:   I Shelly Green am serving as a Education administrator for Dr. Hulan Saas.  This visit occurred during the SARS-CoV-2 public health emergency.  Safety protocols were in place, including screening questions prior to the visit, additional usage of staff PPE, and extensive cleaning of exam room while observing appropriate contact time as indicated for disinfecting solutions.   I'm seeing this patient by the request  of:  Plotnikov, Evie Lacks, MD  CC: Neck pain and headache  [id number]  Shelly Green is a 60 y.o. female coming in with complaint of back and neck pain. OMT 01/18/2020. Patient states her back is not as bad. Major migraines for a while. States they are progressively worse. Pain in the occiput does have tightness that goes to the shoulder.  Medications patient has been prescribed: None          Reviewed prior external information including notes and imaging from previsou exam, outside providers and external EMR if available.   As well as notes that were available from care everywhere and other healthcare systems.  Past medical history, social, surgical and family history all reviewed in electronic medical record.  No pertanent information unless stated regarding to the chief complaint.   Past Medical History:  Diagnosis Date   Arthritis    Cancer ([place])    breast    History of colon polyps    IBS (irritable bowel syndrome)    Menopausal disorder    Migraines    Seasonal allergies    Social anxiety disorder     Allergies  Allergen Reactions   Codeine Anaphylaxis   Hydrocodone Shortness Of Breath   Penicillins Anaphylaxis    As a child Has patient had a PCN reaction causing immediate rash, facial/tongue/throat swelling, SOB or lightheadedness with hypotension: yes Has patient had a PCN reaction causing severe rash involving mucus membranes or  skin necrosis: unknown Has patient had a PCN reaction that required hospitalization: yes Has patient had a PCN reaction occurring within the last 10 years: no If all of the above answers are "NO", then may proceed with Cephalosporin use.    Sulfa Antibiotics Anaphylaxis and Rash   Tramadol Shortness Of Breath   Clindamycin/Lincomycin     rush     Review of Systems:  No headache, visual changes, nausea, vomiting, diarrhea, constipation, dizziness, abdominal pain, skin rash, fevers, chills, night sweats, weight loss, swollen lymph nodes, body aches, joint swelling, chest pain, shortness of breath, mood changes. POSITIVE muscle aches  Objective  Blood pressure 130/90, pulse 82, height 5\' 2"  (1.575 m), weight 125 lb (56.7 kg), SpO2 98 %.   General: No apparent distress alert and oriented x3 mood and affect normal, dressed appropriately.  HEENT: Pupils equal, extraocular movements intact  Respiratory: Patient's speak in full sentences and does not appear short of breath  Cardiovascular: No lower extremity edema, non tender, no erythema  Gait normal with good balance and coordination.  MSK:  Non tender with full range of motion and good stability and symmetric strength and tone of shoulders, elbows, wrist, hip, knee and ankles bilaterally.  -neck exam does have some loss of lordosis, negative Spurling's.  Tightness of the trapezius bilaterally.  Very mild scapular dyskinesis on the right compared to the left. Low back exam does have tightness noted mostly in the thoracolumbar junction.  Negative straight leg test. Osteopathic findings  C2 flexed rotated and side  bent right C7 flexed rotated and side bent left T8 extended rotated and side bent left inhaled rib L2 flexed rotated and side bent right Sacrum right on right       Assessment and Plan:  Migraine headache with aura Patient is having worsening migraines at this time.  We discussed with patient that if worsening symptoms  that is severe it would need to seek medical attention.  Patient understands this.  At the moment though doing relatively okay she states.  Patient will continue with primary care provider but will also refer to neurology and the headache specialist.  Patient will follow up with me again in 6 to 8 weeks.  Given some exercises for scapular and postural therapy will be beneficial.  Follow-up with me again in 6 to 8 weeks  Chronic low back pain Chronic overall but has not been responding somewhat to osteopathic manipulation.  Has been 3 months since we have seen him.  Due to patient starting to be more active I do think that we should see her on a more frequent basis of 6 to 8 weeks.  No change in medications at this time    Nonallopathic problems  Decision today to treat with OMT was based on Physical Exam  After verbal consent patient was treated with HVLA, ME, FPR techniques in cervical, rib, thoracic, lumbar, and sacral  Areas only did muscle energy in the cervical region. Patient tolerated the procedure well with improvement in symptoms  Patient given exercises, stretches and lifestyle modifications  See medications in patient instructions if given  Patient will follow up in 4-8 weeks      The above documentation has been reviewed and is accurate and complete Shelly Pulley, DO       Note: This dictation was prepared with Dragon dictation along with smaller phrase technology. Any transcriptional errors that result from this process are unintentional.

## 2020-03-27 ENCOUNTER — Other Ambulatory Visit: Payer: Self-pay

## 2020-03-27 ENCOUNTER — Ambulatory Visit: Payer: 59 | Admitting: Family Medicine

## 2020-03-27 ENCOUNTER — Ambulatory Visit (INDEPENDENT_AMBULATORY_CARE_PROVIDER_SITE_OTHER): Payer: 59

## 2020-03-27 ENCOUNTER — Encounter: Payer: Self-pay | Admitting: Family Medicine

## 2020-03-27 VITALS — BP 130/90 | HR 82 | Ht 62.0 in | Wt 125.0 lb

## 2020-03-27 DIAGNOSIS — G8929 Other chronic pain: Secondary | ICD-10-CM

## 2020-03-27 DIAGNOSIS — M542 Cervicalgia: Secondary | ICD-10-CM | POA: Diagnosis not present

## 2020-03-27 DIAGNOSIS — G43109 Migraine with aura, not intractable, without status migrainosus: Secondary | ICD-10-CM

## 2020-03-27 DIAGNOSIS — M545 Low back pain, unspecified: Secondary | ICD-10-CM | POA: Diagnosis not present

## 2020-03-27 DIAGNOSIS — M999 Biomechanical lesion, unspecified: Secondary | ICD-10-CM | POA: Diagnosis not present

## 2020-03-27 NOTE — Assessment & Plan Note (Signed)
Patient is having worsening migraines at this time.  We discussed with patient that if worsening symptoms that is severe it would need to seek medical attention.  Patient understands this.  At the moment though doing relatively okay she states.  Patient will continue with primary care provider but will also refer to neurology and the headache specialist.  Patient will follow up with me again in 6 to 8 weeks.  Given some exercises for scapular and postural therapy will be beneficial.  Follow-up with me again in 6 to 8 weeks

## 2020-03-27 NOTE — Patient Instructions (Addendum)
Good to see you Neurology will call you Neck xray today Scapular exercises Keep hands within peripenial vision See me again in 6-8 weeks

## 2020-03-27 NOTE — Assessment & Plan Note (Signed)
Chronic overall but has not been responding somewhat to osteopathic manipulation.  Has been 3 months since we have seen him.  Due to patient starting to be more active I do think that we should see her on a more frequent basis of 6 to 8 weeks.  No change in medications at this time

## 2020-03-28 ENCOUNTER — Encounter: Payer: Self-pay | Admitting: Neurology

## 2020-05-09 NOTE — Progress Notes (Deleted)
[place] Phone: [phone] Subjective:    I'm seeing this patient by the request  of:  Plotnikov, Evie Lacks, MD  CC:   [id number]  Shelly Green is a 60 y.o. female coming in with complaint of back and neck pain. OMT 03/27/2020. Patient states   Medications patient has been prescribed: none  Taking:         Reviewed prior external information including notes and imaging from previsou exam, outside providers and external EMR if available.   As well as notes that were available from care everywhere and other healthcare systems.  Past medical history, social, surgical and family history all reviewed in electronic medical record.  No pertanent information unless stated regarding to the chief complaint.   Past Medical History:  Diagnosis Date  . Arthritis   . Cancer [facility])    breast   . History of colon polyps   . IBS (irritable bowel syndrome)   . Menopausal disorder   . Migraines   . Seasonal allergies   . Social anxiety disorder     Allergies  Allergen Reactions  . Codeine Anaphylaxis  . Hydrocodone Shortness Of Breath  . Penicillins Anaphylaxis    As a child Has patient had a PCN reaction causing immediate rash, facial/tongue/throat swelling, SOB or lightheadedness with hypotension: yes Has patient had a PCN reaction causing severe rash involving mucus membranes or skin necrosis: unknown Has patient had a PCN reaction that required hospitalization: yes Has patient had a PCN reaction occurring within the last 10 years: no If all of the above answers are "NO", then may proceed with Cephalosporin use.   . Sulfa Antibiotics Anaphylaxis and Rash  . Tramadol Shortness Of Breath  . Clindamycin/Lincomycin     rush     Review of Systems:  No headache, visual changes, nausea, vomiting, diarrhea, constipation, dizziness, abdominal pain, skin rash, fevers, chills, night sweats, weight loss,  swollen lymph nodes, body aches, joint swelling, chest pain, shortness of breath, mood changes. POSITIVE muscle aches  Objective  There were no vitals taken for this visit.   General: No apparent distress alert and oriented x3 mood and affect normal, dressed appropriately.  HEENT: Pupils equal, extraocular movements intact  Respiratory: Patient's speak in full sentences and does not appear short of breath  Cardiovascular: No lower extremity edema, non tender, no erythema  Neuro: Cranial nerves II through XII are intact, neurovascularly intact in all extremities with 2+ DTRs and 2+ pulses.  Gait normal with good balance and coordination.  MSK:  Non tender with full range of motion and good stability and symmetric strength and tone of shoulders, elbows, wrist, hip, knee and ankles bilaterally.  Back - Normal skin, Spine with normal alignment and no deformity.  No tenderness to vertebral process palpation.  Paraspinous muscles are not tender and without spasm.   Range of motion is full at neck and lumbar sacral regions  Osteopathic findings  C2 flexed rotated and side bent right C6 flexed rotated and side bent left T3 extended rotated and side bent right inhaled rib T9 extended rotated and side bent left L2 flexed rotated and side bent right Sacrum right on right       Assessment and Plan:    Nonallopathic problems  Decision today to treat with OMT was based on Physical Exam  After verbal consent patient was treated with HVLA, ME, FPR techniques in cervical, rib, thoracic, lumbar, and  sacral  areas  Patient tolerated the procedure well with improvement in symptoms  Patient given exercises, stretches and lifestyle modifications  See medications in patient instructions if given  Patient will follow up in 4-8 weeks      The above documentation has been reviewed and is accurate and complete Jacqualin Combes       Note: This dictation was prepared with Dragon dictation along  with smaller phrase technology. Any transcriptional errors that result from this process are unintentional.

## 2020-05-10 ENCOUNTER — Ambulatory Visit: Payer: 59 | Admitting: Family Medicine

## 2020-05-10 DIAGNOSIS — J069 Acute upper respiratory infection, unspecified: Secondary | ICD-10-CM | POA: Insufficient documentation

## 2020-05-15 ENCOUNTER — Ambulatory Visit (INDEPENDENT_AMBULATORY_CARE_PROVIDER_SITE_OTHER): Payer: 59 | Admitting: Internal Medicine

## 2020-05-15 ENCOUNTER — Ambulatory Visit (INDEPENDENT_AMBULATORY_CARE_PROVIDER_SITE_OTHER): Payer: 59

## 2020-05-15 ENCOUNTER — Other Ambulatory Visit: Payer: Self-pay

## 2020-05-15 ENCOUNTER — Encounter: Payer: Self-pay | Admitting: Internal Medicine

## 2020-05-15 DIAGNOSIS — Z720 Tobacco use: Secondary | ICD-10-CM

## 2020-05-15 DIAGNOSIS — R112 Nausea with vomiting, unspecified: Secondary | ICD-10-CM | POA: Insufficient documentation

## 2020-05-15 DIAGNOSIS — R059 Cough, unspecified: Secondary | ICD-10-CM | POA: Diagnosis not present

## 2020-05-15 DIAGNOSIS — F988 Other specified behavioral and emotional disorders with onset usually occurring in childhood and adolescence: Secondary | ICD-10-CM

## 2020-05-15 LAB — CBC WITH DIFFERENTIAL/PLATELET
Basophils Absolute: 0 10*3/uL (ref 0.0–0.1)
Basophils Relative: 0.2 % (ref 0.0–3.0)
Eosinophils Absolute: 0.1 10*3/uL (ref 0.0–0.7)
Eosinophils Relative: 0.6 % (ref 0.0–5.0)
HCT: 47.9 % — ABNORMAL HIGH (ref 36.0–46.0)
Hemoglobin: 16.1 g/dL — ABNORMAL HIGH (ref 12.0–15.0)
Lymphocytes Relative: 18.2 % (ref 12.0–46.0)
Lymphs Abs: 2.6 10*3/uL (ref 0.7–4.0)
MCHC: 33.6 g/dL (ref 30.0–36.0)
MCV: 95.9 fl (ref 78.0–100.0)
Monocytes Absolute: 0.7 10*3/uL (ref 0.1–1.0)
Monocytes Relative: 5 % (ref 3.0–12.0)
Neutro Abs: 11 10*3/uL — ABNORMAL HIGH (ref 1.4–7.7)
Neutrophils Relative %: 76 % (ref 43.0–77.0)
Platelets: 210 10*3/uL (ref 150.0–400.0)
RBC: 4.99 Mil/uL (ref 3.87–5.11)
RDW: 13.4 % (ref 11.5–15.5)
WBC: 14.4 10*3/uL — ABNORMAL HIGH (ref 4.0–10.5)

## 2020-05-15 LAB — COMPREHENSIVE METABOLIC PANEL
ALT: 14 U/L (ref 0–35)
AST: 16 U/L (ref 0–37)
Albumin: 4.4 g/dL (ref 3.5–5.2)
Alkaline Phosphatase: 111 U/L (ref 39–117)
BUN: 19 mg/dL (ref 6–23)
CO2: 28 mEq/L (ref 19–32)
Calcium: 9.9 mg/dL (ref 8.4–10.5)
Chloride: 107 mEq/L (ref 96–112)
Creatinine, Ser: 0.87 mg/dL (ref 0.40–1.20)
GFR: 72.84 mL/min (ref >60.00)
Glucose, Bld: 87 mg/dL (ref 70–99)
Potassium: 3.9 mEq/L (ref 3.5–5.1)
Sodium: 146 mEq/L — ABNORMAL HIGH (ref 135–145)
Total Bilirubin: 0.5 mg/dL (ref 0.2–1.2)
Total Protein: 7.4 g/dL (ref 6.0–8.3)

## 2020-05-15 LAB — URINALYSIS
Bilirubin Urine: NEGATIVE
Ketones, ur: NEGATIVE
Leukocytes,Ua: NEGATIVE
Nitrite: NEGATIVE
Specific Gravity, Urine: 1.02 (ref 1.000–1.030)
Total Protein, Urine: NEGATIVE
Urine Glucose: NEGATIVE
Urobilinogen, UA: 0.2 (ref 0.0–1.0)
pH: 6 (ref 5.0–8.0)

## 2020-05-15 LAB — LIPASE: Lipase: 63 U/L — ABNORMAL HIGH (ref 11.0–59.0)

## 2020-05-15 MED ORDER — AMPHETAMINE-DEXTROAMPHET ER 30 MG PO CP24: 30.0000 mg | ORAL_CAPSULE | ORAL | 0 refills | Status: DC

## 2020-05-15 MED ORDER — AZITHROMYCIN 250 MG PO TABS: ORAL_TABLET | ORAL | 0 refills | Status: DC

## 2020-05-15 MED ORDER — AMPHETAMINE-DEXTROAMPHET ER 30 MG PO CP24: 30.0000 mg | ORAL_CAPSULE | Freq: Every day | ORAL | 0 refills | Status: DC

## 2020-05-15 MED ORDER — METHYLPREDNISOLONE 4 MG PO TBPK: ORAL_TABLET | ORAL | 0 refills | Status: DC

## 2020-05-15 NOTE — Addendum Note (Signed)
Addended by: Boris Lown B on: 05/15/2020 08:41 AM   Modules accepted: Orders

## 2020-05-15 NOTE — Assessment & Plan Note (Addendum)
LFTs, Lipase RTC sooner if needed Lipase elevated.  Low-fat diet.  The patient does not drink alcohol.  Obtain abdominal ultrasound

## 2020-05-15 NOTE — Assessment & Plan Note (Signed)
COPD exacerbation CXR Medrol pack Cefdinir CBC, CMET

## 2020-05-15 NOTE — Assessment & Plan Note (Signed)
Trying to quit smoking. 

## 2020-05-15 NOTE — Progress Notes (Signed)
Subjective:  Patient ID: Shelly Green, female    DOB: 1960/10/31  Age: 60 y.o. MRN: [id number]  CC: Follow-up (3 month f/u)   HPI SHILA KRUCZEK presents for cough, fatigue, nausea x 2 weeks, threw up last night F/u ADD  Outpatient Medications Prior to Visit  Medication Sig Dispense Refill  . amphetamine-dextroamphetamine (ADDERALL XR) 30 MG 24 hr capsule Take 1 capsule (30 mg total) by mouth every morning. 30 capsule 0  . amphetamine-dextroamphetamine (ADDERALL XR) 30 MG 24 hr capsule Take 1 capsule (30 mg total) by mouth daily. 30 capsule 0  . amphetamine-dextroamphetamine (ADDERALL XR) 30 MG 24 hr capsule Take 1 capsule (30 mg total) by mouth every morning. 30 capsule 0  . busPIRone (BUSPAR) 30 MG tablet TAKE 1 TABLET BY MOUTH 2 TIMES DAILY. 180 tablet 1  . butalbital-acetaminophen-caffeine (FIORICET) 50-325-40 MG tablet TAKE 2 TABLETS BY MOUTH EVERY 6 (SIX) HOURS AS NEEDED. 40 tablet 1  . Cholecalciferol (VITAMIN D3) 50 MCG (2000 UT) capsule Take 1 capsule (2,000 Units total) by mouth daily. 100 capsule 3  . letrozole (FEMARA) 2.5 MG tablet Take 2.5 mg by mouth daily.    Marland Kitchen LORazepam (ATIVAN) 0.5 MG tablet Take 1 tablet (0.5 mg total) by mouth 2 (two) times daily as needed for anxiety. 30 tablet 2  . Multiple Vitamin (MULTIVITAMIN WITH MINERALS) TABS tablet Take 1 tablet by mouth 2 (two) times daily.    . SUMAtriptan (IMITREX) 100 MG tablet TAKE 1 TAB BY MOUTH DAILY AS NEEDED FOR MIGRAINE MAY REPEAT IN 2HOURS IF HEADACHE PERSISTS OR RECURS 12 tablet 3  . buPROPion (WELLBUTRIN XL) 150 MG 24 hr tablet Take 1 tablet (150 mg total) by mouth daily. (Patient not taking: Reported on 05/15/2020) 90 tablet 1  . fluticasone (FLONASE) 50 MCG/ACT nasal spray Place 2 sprays into both nostrils daily. (Patient not taking: Reported on 05/15/2020) 16 g 6   No facility-administered medications prior to visit.    ROS: Review of Systems  Constitutional: Positive for fatigue and unexpected weight  change.  Respiratory: Positive for cough.   Gastrointestinal: Positive for nausea and vomiting.  Neurological: Positive for weakness.    Objective:  BP 112/70 (BP Location: Left Arm)   Pulse 92   Temp 98.2 F (36.8 C) (Oral)   Ht 5\' 2"  (1.575 m)   Wt 120 lb 6.4 oz (54.6 kg)   SpO2 95%   BMI 22.02 kg/m   BP Readings from Last 3 Encounters:  05/15/20 112/70  03/27/20 130/90  02/15/20 122/70    Wt Readings from Last 3 Encounters:  05/15/20 120 lb 6.4 oz (54.6 kg)  03/27/20 125 lb (56.7 kg)  02/15/20 125 lb 6.4 oz (56.9 kg)    Physical Exam Constitutional:      General: She is not in acute distress.    Appearance: She is well-developed.  HENT:     Head: Normocephalic.     Right Ear: External ear normal.     Left Ear: External ear normal.     Nose: Nose normal.  Eyes:     General:        Right eye: No discharge.        Left eye: No discharge.     Conjunctiva/sclera: Conjunctivae normal.     Pupils: Pupils are equal, round, and reactive to light.  Neck:     Thyroid: No thyromegaly.     Vascular: No JVD.     Trachea: No tracheal deviation.  Cardiovascular:  Rate and Rhythm: Normal rate and regular rhythm.     Heart sounds: Normal heart sounds.  Pulmonary:     Effort: No respiratory distress.     Breath sounds: No stridor. No wheezing.  Abdominal:     General: Bowel sounds are normal. There is no distension.     Palpations: Abdomen is soft. There is no mass.     Tenderness: There is no abdominal tenderness. There is no guarding or rebound.  Musculoskeletal:        General: No tenderness.     Cervical back: Normal range of motion and neck supple.  Lymphadenopathy:     Cervical: No cervical adenopathy.  Skin:    Findings: No erythema or rash.  Neurological:     Mental Status: She is oriented to person, place, and time.     Cranial Nerves: No cranial nerve deficit.     Motor: No abnormal muscle tone.     Coordination: Coordination normal.     Deep Tendon  Reflexes: Reflexes normal.  Psychiatric:        Behavior: Behavior normal.        Thought Content: Thought content normal.        Judgment: Judgment normal.     Lab Results  Component Value Date   WBC 8.3 12/29/2018   HGB 14.5 12/29/2018   HCT 43.3 12/29/2018   PLT 171.0 12/29/2018   GLUCOSE 114 (H) 12/29/2018   CHOL 145 12/29/2018   TRIG 62.0 12/29/2018   HDL 47.70 12/29/2018   LDLCALC 85 12/29/2018   ALT 13 12/29/2018   AST 15 12/29/2018   NA 142 12/29/2018   K 3.4 (L) 12/29/2018   CL 108 12/29/2018   CREATININE 0.65 12/29/2018   BUN 18 12/29/2018   CO2 27 12/29/2018   TSH 2.64 12/29/2018   INR 1.02 09/28/2014    CT CARDIAC SCORING  Addendum Date: 01/20/2019   ADDENDUM REPORT: 01/20/2019 08:46 CLINICAL DATA:  Risk stratification EXAM: Coronary Calcium Score TECHNIQUE: The patient was scanned on a [employer] scanner. Axial non-contrast 3 mm slices were carried out through the heart. The data set was analyzed on a dedicated work station and scored using the Max. FINDINGS: Non-cardiac: See separate report from [facility]. Ascending Aorta: Normal caliber. Pericardium: Normal. Coronary arteries: Normal origins. IMPRESSION: Coronary calcium score of 0. Eleonore Chiquito, MD Electronically Signed   By: Eleonore Chiquito   On: 01/20/2019 08:46   Result Date: 01/20/2019 EXAM: OVER-READ INTERPRETATION  CT CHEST The following report is an over-read performed by radiologist Dr. Rolm Baptise of [facility], [place] on 01/20/2019. This over-read does not include interpretation of cardiac or coronary anatomy or pathology. The coronary calcium score interpretation by the cardiologist is attached. COMPARISON:  03/31/2007. FINDINGS: Vascular: Heart is normal size.  Visualized aorta normal caliber. Mediastinum/Nodes: No adenopathy in the lower mediastinum or hila. Lungs/Pleura: Right middle lobe nodule measures 4 mm on image 14. This is stable since 2009 compatible with benign  nodule. Lingular nodule on the same image measures 2-3 mm and is stable since 2009. No confluent opacities or effusions. Upper Abdomen: Imaging into the upper abdomen shows no acute findings. Musculoskeletal: Bilateral breast implants. Chest wall soft tissues are unremarkable otherwise. No acute bony abnormality. IMPRESSION: Small bilateral pulmonary nodules are stable since 2009 compatible with benign nodules. No acute extracardiac findings or significant abnormality. Electronically Signed: By: Rolm Baptise M.D. On: 01/20/2019 08:43    Assessment & Plan:  Walker Kehr, MD

## 2020-05-15 NOTE — Assessment & Plan Note (Signed)
Adderall Rx  Potential benefits of a long term opioids use as well as potential risks (i.e. addiction risk, apnea etc) and complications (i.e. Somnolence, constipation and others) were explained to the patient and were aknowledged.  

## 2020-05-16 ENCOUNTER — Other Ambulatory Visit: Payer: Self-pay | Admitting: Internal Medicine

## 2020-05-16 DIAGNOSIS — R112 Nausea with vomiting, unspecified: Secondary | ICD-10-CM

## 2020-05-16 DIAGNOSIS — R1084 Generalized abdominal pain: Secondary | ICD-10-CM

## 2020-05-18 ENCOUNTER — Other Ambulatory Visit: Payer: Self-pay | Admitting: Internal Medicine

## 2020-05-18 DIAGNOSIS — R1084 Generalized abdominal pain: Secondary | ICD-10-CM

## 2020-05-18 DIAGNOSIS — R112 Nausea with vomiting, unspecified: Secondary | ICD-10-CM

## 2020-05-19 ENCOUNTER — Ambulatory Visit
Admission: RE | Admit: 2020-05-19 | Discharge: 2020-05-19 | Disposition: A | Discharge status: Home / Self Care | Payer: 59 | Source: Ambulatory Visit | Attending: Internal Medicine | Admitting: Internal Medicine

## 2020-05-19 ENCOUNTER — Other Ambulatory Visit: Payer: Self-pay

## 2020-05-19 DIAGNOSIS — R112 Nausea with vomiting, unspecified: Secondary | ICD-10-CM

## 2020-05-19 DIAGNOSIS — R1084 Generalized abdominal pain: Secondary | ICD-10-CM

## 2020-05-31 DIAGNOSIS — T451X5A Adverse effect of antineoplastic and immunosuppressive drugs, initial encounter: Secondary | ICD-10-CM | POA: Insufficient documentation

## 2020-06-09 NOTE — Progress Notes (Signed)
NEUROLOGY CONSULTATION NOTE  [name] MRN: [id number] DOB: [birth year]  Referring provider: Hulan Saas, DO Primary care provider: Lew Dawes, MD  Reason for consult:  headache  Assessment/Plan:   Migraine without aura, without status migrainosus, not intractable  1.  Migraine prevention:  Propranolol ER 60mg  daily.  If no improvement in 4 weeks, increase to 80mg  daily 2.  Migraine rescue:  Tylenol first line, sumatriptan second line.  If severe, take sumatriptan first.  Try to avoid Fioricet 3.  Limit use of pain relievers to no more than 2 days out of week to prevent risk of rebound or medication-overuse headache. 4.  Keep headache diary 5.  Follow up 6 months.   Subjective:  Shelly Green is a 60 year old right-handed female with migraines, ADD, anxiety, arthritis, IBS, migraines and history of breast cancer in remission who presents for migraines.  History supplemented by PCP and referring provider's notes.  Started having headaches as a child.  They were moderate to severe bifrontal pressupe to throbbing.  They are associated with nausea, photophobia and phonophobia.  If takes something early and lay down, they will last within an hour, otherwise all day.  They occur at least 2 to 3 days a week.  Triggers include change in weather and seasonal allergies.  Relieving factors include rest.   Increased after starting Wellbutrin.  Severe pain in jaw and back of head.  Associated with nausea, vomiting, photophobia and phonophobia.  They were daily.  Stopped Wellbutrin after 5 days.  Since stopping Wellbutrin, headaches decreased to once a week and now back to baseline.    Rescue protocol:  Tylenol first line, sumatriptan second line.  Will take Fioricet if onset is severe and quick. Current NSAIDS/analgesics:  ASA, Fioricet Current triptans:  Sumatriptan 100mg  Current ergotamine:  none Current anti-emetic:  none Current muscle relaxants:  none Current  Antihypertensive medications:  none Current Antidepressant medications:  none Current Anticonvulsant medications:  Gabapentin (for hot flashes) Current anti-CGRP:  none Current Vitamins/Herbal/Supplements:  MVI, D Current Antihistamines/Decongestants:  none Other therapy:  OMM Hormone/birth control:  Femara Other medications:  Adderall XR, BuSpar, lorazepam  Past NSAIDS/analgesics:  Diclofenac, tramadol, ibuprofen Past abortive triptans:  none Past abortive ergotamine:  none Past muscle relaxants:  none Past anti-emetic:  Zofran, Compazine Past antihypertensive medications:  none Past antidepressant medications:  Wellbutrin (aggravated headaches), paroxetine, Trintellix Past anticonvulsant medications:  lamotrigine Past anti-CGRP:  none Past vitamins/Herbal/Supplements:  none Past antihistamines/decongestants:  Benadryl, Flonase, Claritin Other past therapies:  none  Caffeine:  No coffee.  Maybe 1 small coca cola daily Diet:  Drinks little water. Exercise:  yes Depression:  yes; Anxiety:  none Other pain:  Chronic low back pain Sleep:  Poor.  Hot flashes at night.  Takes gabapentin Family history of headache:  Maternal grandmother, mother, sisters   CBC and CMP from April reviewed.   PAST MEDICAL HISTORY: Past Medical History:  Diagnosis Date  . Arthritis   . Cancer [facility])    breast   . History of colon polyps   . IBS (irritable bowel syndrome)   . Menopausal disorder   . Migraines   . Seasonal allergies   . Social anxiety disorder     PAST SURGICAL HISTORY: Past Surgical History:  Procedure Laterality Date  . CARPAL TUNNEL RELEASE  08/20/2011   Procedure: CARPAL TUNNEL RELEASE;  Surgeon: Cammie Sickle., MD;  Location: [place];  Service: Orthopedics;  Laterality: Right;  .  HEMORROIDECTOMY    . LAPAROSCOPIC CHOLECYSTECTOMY  2007  . TOTAL ABDOMINAL HYSTERECTOMY      MEDICATIONS: Current Outpatient Medications on File Prior to Visit   Medication Sig Dispense Refill  . amphetamine-dextroamphetamine (ADDERALL XR) 30 MG 24 hr capsule Take 1 capsule (30 mg total) by mouth every morning. 30 capsule 0  . amphetamine-dextroamphetamine (ADDERALL XR) 30 MG 24 hr capsule Take 1 capsule (30 mg total) by mouth daily. 30 capsule 0  . amphetamine-dextroamphetamine (ADDERALL XR) 30 MG 24 hr capsule Take 1 capsule (30 mg total) by mouth every morning. 30 capsule 0  . azithromycin (ZITHROMAX Z-PAK) 250 MG tablet As directed 6 tablet 0  . busPIRone (BUSPAR) 30 MG tablet TAKE 1 TABLET BY MOUTH 2 TIMES DAILY. 180 tablet 1  . butalbital-acetaminophen-caffeine (FIORICET) 50-325-40 MG tablet TAKE 2 TABLETS BY MOUTH EVERY 6 (SIX) HOURS AS NEEDED. 40 tablet 1  . Cholecalciferol (VITAMIN D3) 50 MCG (2000 UT) capsule Take 1 capsule (2,000 Units total) by mouth daily. 100 capsule 3  . letrozole (FEMARA) 2.5 MG tablet Take 2.5 mg by mouth daily.    Marland Kitchen LORazepam (ATIVAN) 0.5 MG tablet Take 1 tablet (0.5 mg total) by mouth 2 (two) times daily as needed for anxiety. 30 tablet 2  . methylPREDNISolone (MEDROL DOSEPAK) 4 MG TBPK tablet As directed 21 tablet 0  . Multiple Vitamin (MULTIVITAMIN WITH MINERALS) TABS tablet Take 1 tablet by mouth 2 (two) times daily.    . SUMAtriptan (IMITREX) 100 MG tablet TAKE 1 TAB BY MOUTH DAILY AS NEEDED FOR MIGRAINE MAY REPEAT IN 2HOURS IF HEADACHE PERSISTS OR RECURS 12 tablet 3   No current facility-administered medications on file prior to visit.    ALLERGIES: Allergies  Allergen Reactions  . Codeine Anaphylaxis  . Hydrocodone Shortness Of Breath  . Penicillins Anaphylaxis    As a child Has patient had a PCN reaction causing immediate rash, facial/tongue/throat swelling, SOB or lightheadedness with hypotension: yes Has patient had a PCN reaction causing severe rash involving mucus membranes or skin necrosis: unknown Has patient had a PCN reaction that required hospitalization: yes Has patient had a PCN reaction  occurring within the last 10 years: no If all of the above answers are "NO", then may proceed with Cephalosporin use.   . Sulfa Antibiotics Anaphylaxis and Rash  . Tramadol Shortness Of Breath  . Clindamycin/Lincomycin     rush  . Wellbutrin [Bupropion]     HAs    FAMILY HISTORY: Family History  Problem Relation Age of Onset  . Arthritis Mother   . Lymphoma Mother   . Colon cancer Sister 75  . Mental illness Daughter   . Mental illness Maternal Aunt   . Arthritis Maternal Grandmother   . Hypertension Maternal Grandfather     Objective:  Blood pressure 123/78, pulse 85, height 5\' 2"  (1.575 m), weight 120 lb 12.8 oz (54.8 kg), SpO2 98 %. General: No acute distress.  Patient appears well-groomed.   Head:  Normocephalic/atraumatic Eyes:  fundi examined but not visualized Neck: supple, no paraspinal tenderness, full range of motion Back: No paraspinal tenderness Heart: regular rate and rhythm Lungs: Clear to auscultation bilaterally. Vascular: No carotid bruits. Neurological Exam: Mental status: alert and oriented to person, place, and time, recent and remote memory intact, fund of knowledge intact, attention and concentration intact, speech fluent and not dysarthric, language intact. Cranial nerves: CN I: not tested CN II: pupils equal, round and reactive to light, visual fields intact CN III, IV,  VI:  full range of motion, no nystagmus, no ptosis CN V: facial sensation intact. CN VII: upper and lower face symmetric CN VIII: hearing intact CN IX, X: gag intact, uvula midline CN XI: sternocleidomastoid and trapezius muscles intact CN XII: tongue midline Bulk & Tone: normal, no fasciculations. Motor:  muscle strength 5/5 throughout Sensation:  Pinprick, temperature and vibratory sensation intact. Deep Tendon Reflexes:  2+ throughout,  toes downgoing.   Finger to nose testing:  Without dysmetria.   Heel to shin:  Without dysmetria.   Gait:  Normal station and stride.   Romberg negative.    Thank you for allowing me to take part in the care of this patient.  Metta Clines, DO  CC:  Hulan Saas, DO  Lew Dawes, MD

## 2020-06-12 ENCOUNTER — Encounter: Payer: Self-pay | Admitting: Neurology

## 2020-06-12 ENCOUNTER — Ambulatory Visit: Payer: 59 | Admitting: Neurology

## 2020-06-12 ENCOUNTER — Other Ambulatory Visit: Payer: Self-pay

## 2020-06-12 VITALS — BP 123/78 | HR 85 | Ht 62.0 in | Wt 120.8 lb

## 2020-06-12 DIAGNOSIS — G43009 Migraine without aura, not intractable, without status migrainosus: Secondary | ICD-10-CM | POA: Diagnosis not present

## 2020-06-12 MED ORDER — PROPRANOLOL HCL ER 60 MG PO CP24: 60.0000 mg | ORAL_CAPSULE | Freq: Every day | ORAL | 5 refills | Status: DC

## 2020-06-12 NOTE — Patient Instructions (Signed)
  1. Start propranolol ER 60mg  daily.  Contact us in 4 weeks with update and we can increase dose if needed. 2. Take Tylenol as needed.  Sumatriptan as second line.  If migraine severe at the beginning, take sumatriptan 100mg  at earliest onset of headache.  May repeat dose once in 2 hours if needed.  Maximum 2 tablets in 24 hours.  Try not to take the butalbital 3. Limit use of pain relievers to no more than 2 days out of the week.  These medications include acetaminophen, NSAIDs (ibuprofen/Advil/Motrin, naproxen/Aleve, triptans (Imitrex/sumatriptan), Excedrin, and narcotics.  This will help reduce risk of rebound headaches. 4. Be aware of common food triggers:  - Caffeine:  coffee, black tea, cola, Mt. Dew  - Chocolate  - Dairy:  aged cheeses (brie, blue, cheddar, gouda, [place], provolone, [place], Swiss, etc), chocolate milk, buttermilk, sour cream, limit eggs and yogurt  - Nuts, peanut butter  - Alcohol  - Cereals/grains:  FRESH breads (fresh bagels, sourdough, doughnuts), yeast productions  - Processed/canned/aged/cured meats (pre-packaged deli meats, hotdogs)  - MSG/glutamate:  soy sauce, flavor enhancer, pickled/preserved/marinated foods  - Sweeteners:  aspartame (Equal, Nutrasweet).  Sugar and Splenda are okay  - Vegetables:  legumes (lima beans, lentils, snow peas, fava beans, pinto peans, peas, garbanzo beans), sauerkraut, onions, olives, pickles  - Fruit:  avocados, bananas, citrus fruit (orange, lemon, grapefruit), mango  - Other:  Frozen meals, macaroni and cheese 5. Routine exercise 6. Stay adequately hydrated (aim for 64 oz water daily) 7. Keep headache diary 8. Maintain proper stress management 9. Maintain proper sleep hygiene 10. Do not skip meals 11. Consider supplements:  magnesium citrate 400mg  daily, riboflavin 400mg  daily, coenzyme Q10 100mg  three times daily.

## 2020-06-14 NOTE — Progress Notes (Signed)
[place] Phone: [phone] Subjective:   Shelly Green, am serving as a scribe for Dr. Hulan Saas. This visit occurred during the SARS-CoV-2 public health emergency.  Safety protocols were in place, including screening questions prior to the visit, additional usage of staff PPE, and extensive cleaning of exam room while observing appropriate contact time as indicated for disinfecting solutions.   I'm seeing this patient by the request  of:  Plotnikov, Evie Lacks, MD  CC: Back pain and neck pain  IRS:[id number]  Shelly Green is a 60 y.o. female coming in with complaint of back and neck pain. OMT 03/27/2020. Patient states that she has been having migraines for a while but recently placed on medication for them. States that she otherwise has had Green change in her back since last visit.  Patient states nothing worse but very minimally better.  Does feel better after the manipulations.  Denies any fevers chills or any abnormal weight loss.  Medications patient has been prescribed: None       Patient did have x-rays of the lumbar spine in December 2021 that only showed diffuse multilevel degenerative changes but Green signs of any type of mass.  Or other bony abnormality Cervical x-rays also showed the patient did have degenerative disc with spondylosis from C4-C7  Reviewed prior external information including notes and imaging from previsou exam, outside providers and external EMR if available.   As well as notes that were available from care everywhere and other healthcare systems.  Past medical history, social, surgical and family history all reviewed in electronic medical record.  Green pertanent information unless stated regarding to the chief complaint.   Past Medical History:  Diagnosis Date  . Arthritis   . Cancer [facility])    breast   . History of colon polyps   . IBS (irritable bowel syndrome)   .  Menopausal disorder   . Migraines   . Seasonal allergies   . Social anxiety disorder     Allergies  Allergen Reactions  . Codeine Anaphylaxis  . Hydrocodone Shortness Of Breath  . Penicillins Anaphylaxis    As a child Has patient had a PCN reaction causing immediate rash, facial/tongue/throat swelling, SOB or lightheadedness with hypotension: yes Has patient had a PCN reaction causing severe rash involving mucus membranes or skin necrosis: unknown Has patient had a PCN reaction that required hospitalization: yes Has patient had a PCN reaction occurring within the last 10 years: Green If all of the above answers are "Green", then may proceed with Cephalosporin use.   . Sulfa Antibiotics Anaphylaxis and Rash  . Tramadol Shortness Of Breath  . Clindamycin/Lincomycin     rush  . Wellbutrin [Bupropion]     HAs     Review of Systems:  Green visual changes, nausea, vomiting, diarrhea, constipation, dizziness,, skin rash, fevers, chills, night sweats, weight loss, swollen lymph nodes, body aches, joint swelling, chest pain, shortness of breath, mood changes. POSITIVE muscle aches, abdominal pain, headache  Objective  Blood pressure 120/78, pulse 83, height 5\' 2"  (1.575 m), weight 124 lb (56.2 kg), SpO2 97 %.   General: Green apparent distress alert and oriented x3 mood and affect normal, dressed appropriately.  HEENT: Pupils equal, extraocular movements intact  Respiratory: Patient's speak in full sentences and does not appear short of breath  Cardiovascular: Green lower extremity edema, non tender, Green erythema  Gait normal with good balance and coordination.  Low back exam does have some mild loss of lordosis.  Tightness noted in the paraspinal musculature.  Patient does have some tightness with FABER test bilaterally but negative straight leg test. Neck exam as well as upper back does have increasing in tightness from previous exam.  Loss of lordosis of the cervical spine.  Mild crepitus with range  of motion.  Tightness of the trapezius bilaterally right greater than left.  Continued scapular dyskinesis right greater than left.  Osteopathic findings   C7 flexed rotated and side bent left  T6 extended rotated and side bent right inhaled rib L4 flexed rotated and side bent left  Sacrum right on right       Assessment and Plan:  Chronic low back pain Chronic problem that patient has had some time.  Patient will watch to see if any type of constipation could be contributing with it.  We discussed with patient that she continues to respond fairly well to osteopathic manipulation.  Migraine headache with aura Patient does have a history of migraines as well.  He will see neurology.  Hopefully the patient will respond to the propranolol.  The patient has responded a little bit to the osteopathic manipulation vascular surgery.  We will continue to monitor.  Follow-up with me again 6 to 8 weeks    Nonallopathic problems  Decision today to treat with OMT was based on Physical Exam  After verbal consent patient was treated with  ME, FPR techniques in cervical, rib, thoracic, lumbar, and sacral  areas avoided HVLA on the neck secondary to some mild tightness more than usual today  Patient tolerated the procedure well with improvement in symptoms  Patient given exercises, stretches and lifestyle modifications  See medications in patient instructions if given  Patient will follow up in 6-12 weeks     The above documentation has been reviewed and is accurate and complete Lyndal Pulley, DO       Note: This dictation was prepared with Dragon dictation along with smaller phrase technology. Any transcriptional errors that result from this process are unintentional.

## 2020-06-15 ENCOUNTER — Ambulatory Visit: Payer: 59 | Admitting: Family Medicine

## 2020-06-15 ENCOUNTER — Encounter: Payer: Self-pay | Admitting: Family Medicine

## 2020-06-15 ENCOUNTER — Other Ambulatory Visit: Payer: Self-pay

## 2020-06-15 VITALS — BP 120/78 | HR 83 | Ht 62.0 in | Wt 124.0 lb

## 2020-06-15 DIAGNOSIS — M9903 Segmental and somatic dysfunction of lumbar region: Secondary | ICD-10-CM | POA: Diagnosis not present

## 2020-06-15 DIAGNOSIS — M9902 Segmental and somatic dysfunction of thoracic region: Secondary | ICD-10-CM | POA: Diagnosis not present

## 2020-06-15 DIAGNOSIS — G8929 Other chronic pain: Secondary | ICD-10-CM

## 2020-06-15 DIAGNOSIS — M545 Low back pain, unspecified: Secondary | ICD-10-CM | POA: Diagnosis not present

## 2020-06-15 DIAGNOSIS — M9908 Segmental and somatic dysfunction of rib cage: Secondary | ICD-10-CM | POA: Diagnosis not present

## 2020-06-15 DIAGNOSIS — M9904 Segmental and somatic dysfunction of sacral region: Secondary | ICD-10-CM

## 2020-06-15 DIAGNOSIS — G43109 Migraine with aura, not intractable, without status migrainosus: Secondary | ICD-10-CM

## 2020-06-15 DIAGNOSIS — M9901 Segmental and somatic dysfunction of cervical region: Secondary | ICD-10-CM | POA: Diagnosis not present

## 2020-06-15 NOTE — Assessment & Plan Note (Signed)
Chronic problem that patient has had some time.  Patient will watch to see if any type of constipation could be contributing with it.  We discussed with patient that she continues to respond fairly well to osteopathic manipulation.

## 2020-06-15 NOTE — Patient Instructions (Addendum)
Teaspoon of room temperature honey before bed Mirilax 17g daily, Colace 100mg  daily for 10 days Stay active See me again in 3 months

## 2020-06-15 NOTE — Assessment & Plan Note (Signed)
Patient does have a history of migraines as well.  He will see neurology.  Hopefully the patient will respond to the propranolol.  The patient has responded a little bit to the osteopathic manipulation vascular surgery.  We will continue to monitor.  Follow-up with me again 6 to 8 weeks

## 2020-06-19 ENCOUNTER — Other Ambulatory Visit: Payer: Self-pay | Admitting: Internal Medicine

## 2020-07-03 ENCOUNTER — Other Ambulatory Visit: Payer: Self-pay

## 2020-07-03 MED ORDER — PROPRANOLOL HCL ER 80 MG PO CP24: ORAL_CAPSULE | ORAL | 4 refills | Status: DC

## 2020-07-03 NOTE — Progress Notes (Signed)
Per DR.Jaffe, We can increase propranolol ER to 80mg  daily

## 2020-07-11 ENCOUNTER — Telehealth (INDEPENDENT_AMBULATORY_CARE_PROVIDER_SITE_OTHER): Payer: 59 | Admitting: Family Medicine

## 2020-07-11 DIAGNOSIS — R059 Cough, unspecified: Secondary | ICD-10-CM

## 2020-07-11 MED ORDER — DOXYCYCLINE HYCLATE 100 MG PO TABS: 100.0000 mg | ORAL_TABLET | Freq: Two times a day (BID) | ORAL | 0 refills | Status: DC

## 2020-07-11 NOTE — Progress Notes (Signed)
Virtual Visit via Video Note  I connected with Shelly Green  on 07/11/20 at  6:20 PM EDT by a video enabled telemedicine application and verified that I am speaking with the correct person using two identifiers.  Location patient: home, [place] Location provider:work or home office Persons participating in the virtual visit: patient, provider  I discussed the limitations of evaluation and management by telemedicine and the availability of in person appointments. The patient expressed understanding and agreed to proceed.   HPI:  Acute telemedicine visit for cough: -Onset: about 3 days ago - but has had chronic productive cough since April - has allergies and gerd -did a covid test today which was negative -Symptoms include: worsening cough, thick sinus drainage and thick phlegm, feels tired, headaches, PND, vomiting once last night -Denies:fevers, weight loss, CP, SOB, wheezing - no known sick contacts -Pertinent past medical history: smoking -Pertinent medication allergies: Allergies  Allergen Reactions   Codeine Anaphylaxis   Hydrocodone Shortness Of Breath   Penicillins Anaphylaxis    As a child Has patient had a PCN reaction causing immediate rash, facial/tongue/throat swelling, SOB or lightheadedness with hypotension: yes Has patient had a PCN reaction causing severe rash involving mucus membranes or skin necrosis: unknown Has patient had a PCN reaction that required hospitalization: yes Has patient had a PCN reaction occurring within the last 10 years: no If all of the above answers are "NO", then may proceed with Cephalosporin use.    Sulfa Antibiotics Anaphylaxis and Rash   Tramadol Shortness Of Breath   Clindamycin/Lincomycin     rush   Wellbutrin [Bupropion]     HAs  -COVID-19 vaccine status: had 2 covid vaccines -GI put her on prilosec and thought cough was improving, but now cough is back  ROS: See pertinent positives and negatives per HPI.  Past Medical History:   Diagnosis Date   Arthritis    Cancer ([place])    breast    History of colon polyps    IBS (irritable bowel syndrome)    Menopausal disorder    Migraines    Seasonal allergies    Social anxiety disorder     Past Surgical History:  Procedure Laterality Date   CARPAL TUNNEL RELEASE  08/20/2011   Procedure: CARPAL TUNNEL RELEASE;  Surgeon: Cammie Sickle., MD;  Location: [facility];  Service: Orthopedics;  Laterality: Right;   HEMORROIDECTOMY     LAPAROSCOPIC CHOLECYSTECTOMY  2007   TOTAL ABDOMINAL HYSTERECTOMY       Current Outpatient Medications:    doxycycline (VIBRA-TABS) 100 MG tablet, Take 1 tablet (100 mg total) by mouth 2 (two) times daily., Disp: 14 tablet, Rfl: 0   amphetamine-dextroamphetamine (ADDERALL XR) 30 MG 24 hr capsule, Take 1 capsule (30 mg total) by mouth every morning., Disp: 30 capsule, Rfl: 0   busPIRone (BUSPAR) 30 MG tablet, TAKE 1 TABLET BY MOUTH 2 TIMES DAILY., Disp: 180 tablet, Rfl: 1   butalbital-acetaminophen-caffeine (FIORICET) 50-325-40 MG tablet, TAKE 2 TABLETS BY MOUTH EVERY 6 (SIX) HOURS AS NEEDED., Disp: 40 tablet, Rfl: 1   Cholecalciferol (VITAMIN D3) 50 MCG (2000 UT) capsule, Take 1 capsule (2,000 Units total) by mouth daily., Disp: 100 capsule, Rfl: 3   letrozole (FEMARA) 2.5 MG tablet, Take 2.5 mg by mouth daily., Disp: , Rfl:    LORazepam (ATIVAN) 0.5 MG tablet, Take 1 tablet (0.5 mg total) by mouth 2 (two) times daily as needed for anxiety., Disp: 30 tablet, Rfl: 2   Multiple Vitamin (MULTIVITAMIN  WITH MINERALS) TABS tablet, Take 1 tablet by mouth 2 (two) times daily., Disp: , Rfl:    propranolol ER (INDERAL LA) 80 MG 24 hr capsule, Take 80 mg by mouth daily, Disp: 30 capsule, Rfl: 4   SUMAtriptan (IMITREX) 100 MG tablet, TAKE 1 TAB BY MOUTH DAILY AS NEEDED FOR MIGRAINE MAY REPEAT IN 2HOURS IF HEADACHE PERSISTS OR RECURS, Disp: 12 tablet, Rfl: 3  EXAM:  VITALS per patient if applicable:  GENERAL: alert, oriented, appears  well and in no acute distress  HEENT: atraumatic, conjunttiva clear, no obvious abnormalities on inspection of external nose and ears  NECK: normal movements of the head and neck  LUNGS: on inspection no signs of respiratory distress, breathing rate appears normal, no obvious gross SOB, gasping or wheezing  CV: no obvious cyanosis  MS: moves all visible extremities without noticeable abnormality  PSYCH/NEURO: pleasant and cooperative, no obvious depression or anxiety, speech and thought processing grossly intact  ASSESSMENT AND PLAN:  Discussed the following assessment and plan:  Cough  -we discussed possible serious and likely etiologies, options for evaluation and workup, limitations of telemedicine visit vs in person visit, treatment, treatment risks and precautions. Pt prefers to treat via telemedicine empirically rather than in person at this moment.  For the acute symptoms query developing sinusitis, bronchitis, viral upper respiratory illness, COVID with false negative testing versus other.  Because of the duration of chronic issues, now worsening with thick discolored mucus, she has opted to try an antibiotic.  Did advise given a reported chronic cough and her history of smoking that she schedule a follow-up visit with her primary care office in person in 1 to 2 weeks to ensure her cough is resolved.  Advised of causes of chronic cough both common and serious.  She agrees to contact her primary care office to schedule follow-up.  She reports she does have a follow-up coming up in a few weeks, but she will make sure that it is in the next 1 to 2 weeks. Scheduled follow up with PCP offered: Patient prefers to call to schedule Advised to seek prompt in person care if worsening, new symptoms arise, or if is not improving with treatment. Discussed options for inperson care if PCP office not available. Did let this patient know that I only do telemedicine on Tuesdays and Thursdays for  [place]. Advised to schedule follow up visit with PCP or [facility] if any further questions or concerns to avoid delays in care.   I discussed the assessment and treatment plan with the patient. The patient was provided an opportunity to ask questions and all were answered. The patient agreed with the plan and demonstrated an understanding of the instructions.     Lucretia Kern, DO

## 2020-07-11 NOTE — Patient Instructions (Signed)
  HOME CARE TIPS:  Schedule a follow-up visit with your primary care doctor in 1 to 2 weeks  -Would advise 1 more COVID test given you did a home test. Most pharmacies also offer testing and home test kits. If the Covid19 test is positive, please make a prompt follow up visit with your primary care office or with [facility] to discuss treatment options. Treatments for Covid19 are best given early in the course of the illness.   -I sent the medication(s) we discussed to your pharmacy: Meds ordered this encounter  Medications   doxycycline (VIBRA-TABS) 100 MG tablet    Sig: Take 1 tablet (100 mg total) by mouth 2 (two) times daily.    Dispense:  14 tablet    Refill:  0     -Could try starting an allergy pill since his Allegra  -stay hydrated, drink plenty of fluids and eat small healthy meals - avoid dairy  -Please avoid smoking  -If the Covid test is positive, check out the [facility] website for more information on home care, transmission and treatment for COVID19   It was nice to meet you today, and I really hope you are feeling better soon. I help [facility] out with telemedicine visits on Tuesdays and Thursdays and am available for visits on those days. If you have any concerns or questions following this visit please schedule a follow up visit with your Primary Care doctor or seek care at a local urgent care clinic to avoid delays in care.    Seek in person care or schedule a follow up video visit promptly if your symptoms worsen, new concerns arise or you are not improving with treatment. Call 911 and/or seek emergency care if your symptoms are severe or life threatening.

## 2020-07-25 ENCOUNTER — Encounter: Payer: Self-pay | Admitting: Internal Medicine

## 2020-07-25 ENCOUNTER — Ambulatory Visit: Payer: 59 | Admitting: Internal Medicine

## 2020-07-25 ENCOUNTER — Other Ambulatory Visit: Payer: Self-pay

## 2020-07-25 DIAGNOSIS — R059 Cough, unspecified: Secondary | ICD-10-CM

## 2020-07-25 DIAGNOSIS — Z720 Tobacco use: Secondary | ICD-10-CM | POA: Diagnosis not present

## 2020-07-25 DIAGNOSIS — J441 Chronic obstructive pulmonary disease with (acute) exacerbation: Secondary | ICD-10-CM | POA: Diagnosis not present

## 2020-07-25 DIAGNOSIS — F17200 Nicotine dependence, unspecified, uncomplicated: Secondary | ICD-10-CM

## 2020-07-25 MED ORDER — BENZONATATE 200 MG PO CAPS: 200.0000 mg | ORAL_CAPSULE | Freq: Three times a day (TID) | ORAL | 1 refills | Status: DC | PRN

## 2020-07-25 MED ORDER — METHYLPREDNISOLONE 4 MG PO TBPK: ORAL_TABLET | ORAL | 0 refills | Status: DC

## 2020-07-25 MED ORDER — TRELEGY ELLIPTA 100-62.5-25 MCG/INH IN AEPB: 1.0000 | INHALATION_SPRAY | Freq: Every day | RESPIRATORY_TRACT | 6 refills | Status: DC

## 2020-07-25 NOTE — Assessment & Plan Note (Addendum)
Refractory Treat COPD CXR was ok Tessalon prn

## 2020-07-25 NOTE — Progress Notes (Signed)
Subjective:  Patient ID: Shelly Green, female    DOB: 13-Jun-1960  Age: 60 y.o. MRN: [id number]  CC: Follow-up   HPI Shelly Green presents for cough since April. Doxy did not help. CXR was ok in 4/22  Smoking >1 ppd  Outpatient Medications Prior to Visit  Medication Sig Dispense Refill   amphetamine-dextroamphetamine (ADDERALL XR) 30 MG 24 hr capsule Take 1 capsule (30 mg total) by mouth every morning. 30 capsule 0   busPIRone (BUSPAR) 30 MG tablet TAKE 1 TABLET BY MOUTH 2 TIMES DAILY. 180 tablet 1   butalbital-acetaminophen-caffeine (FIORICET) 50-325-40 MG tablet TAKE 2 TABLETS BY MOUTH EVERY 6 (SIX) HOURS AS NEEDED. 40 tablet 1   Cholecalciferol (VITAMIN D3) 50 MCG (2000 UT) capsule Take 1 capsule (2,000 Units total) by mouth daily. 100 capsule 3   letrozole (FEMARA) 2.5 MG tablet Take 2.5 mg by mouth daily.     Multiple Vitamin (MULTIVITAMIN WITH MINERALS) TABS tablet Take 1 tablet by mouth 2 (two) times daily.     propranolol ER (INDERAL LA) 80 MG 24 hr capsule Take 80 mg by mouth daily 30 capsule 4   SUMAtriptan (IMITREX) 100 MG tablet TAKE 1 TAB BY MOUTH DAILY AS NEEDED FOR MIGRAINE MAY REPEAT IN 2HOURS IF HEADACHE PERSISTS OR RECURS 12 tablet 3   doxycycline (VIBRA-TABS) 100 MG tablet Take 1 tablet (100 mg total) by mouth 2 (two) times daily. (Patient not taking: Reported on 07/25/2020) 14 tablet 0   LORazepam (ATIVAN) 0.5 MG tablet Take 1 tablet (0.5 mg total) by mouth 2 (two) times daily as needed for anxiety. (Patient not taking: Reported on 07/25/2020) 30 tablet 2   No facility-administered medications prior to visit.    ROS: Review of Systems  Constitutional:  Negative for activity change, appetite change, chills, fatigue and unexpected weight change.  HENT:  Negative for congestion, mouth sores and sinus pressure.   Eyes:  Negative for visual disturbance.  Respiratory:  Positive for cough and wheezing. Negative for chest tightness and shortness of breath.    Gastrointestinal:  Negative for abdominal pain and nausea.  Genitourinary:  Negative for difficulty urinating, frequency and vaginal pain.  Musculoskeletal:  Negative for back pain and gait problem.  Skin:  Negative for pallor and rash.  Neurological:  Negative for dizziness, tremors, weakness, numbness and headaches.  Psychiatric/Behavioral:  Negative for confusion and sleep disturbance.    Objective:  BP (!) 142/80 (BP Location: Left Arm)   Pulse 74   Temp 98.5 F (36.9 C) (Oral)   Ht 5\' 2"  (1.575 m)   Wt 122 lb 6.4 oz (55.5 kg)   SpO2 97%   BMI 22.39 kg/m   BP Readings from Last 3 Encounters:  07/25/20 (!) 142/80  06/15/20 120/78  06/12/20 123/78    Wt Readings from Last 3 Encounters:  07/25/20 122 lb 6.4 oz (55.5 kg)  06/15/20 124 lb (56.2 kg)  06/12/20 120 lb 12.8 oz (54.8 kg)    Physical Exam Constitutional:      General: She is not in acute distress.    Appearance: Normal appearance. She is well-developed.  HENT:     Head: Normocephalic.     Right Ear: External ear normal.     Left Ear: External ear normal.     Nose: Nose normal.  Eyes:     General:        Right eye: No discharge.        Left eye: No discharge.  Conjunctiva/sclera: Conjunctivae normal.     Pupils: Pupils are equal, round, and reactive to light.  Neck:     Thyroid: No thyromegaly.     Vascular: No JVD.     Trachea: No tracheal deviation.  Cardiovascular:     Rate and Rhythm: Normal rate and regular rhythm.     Heart sounds: Normal heart sounds.  Pulmonary:     Effort: No respiratory distress.     Breath sounds: No stridor. No wheezing.  Abdominal:     General: Bowel sounds are normal. There is no distension.     Palpations: Abdomen is soft. There is no mass.     Tenderness: There is no abdominal tenderness. There is no guarding or rebound.  Musculoskeletal:        General: No tenderness.     Cervical back: Normal range of motion and neck supple. No rigidity.  Lymphadenopathy:      Cervical: No cervical adenopathy.  Skin:    Findings: No erythema or rash.  Neurological:     Cranial Nerves: No cranial nerve deficit.     Motor: No abnormal muscle tone.     Coordination: Coordination normal.     Deep Tendon Reflexes: Reflexes normal.  Psychiatric:        Behavior: Behavior normal.        Thought Content: Thought content normal.        Judgment: Judgment normal.   I personally provided Trelegy inhaler use teaching. After the teaching patient should be able to use it effectively. All questions were answered   Lab Results  Component Value Date   WBC 14.4 (H) 05/15/2020   HGB 16.1 (H) 05/15/2020   HCT 47.9 (H) 05/15/2020   PLT 210.0 05/15/2020   GLUCOSE 87 05/15/2020   CHOL 145 12/29/2018   TRIG 62.0 12/29/2018   HDL 47.70 12/29/2018   LDLCALC 85 12/29/2018   ALT 14 05/15/2020   AST 16 05/15/2020   NA 146 (H) 05/15/2020   K 3.9 05/15/2020   CL 107 05/15/2020   CREATININE 0.87 05/15/2020   BUN 19 05/15/2020   CO2 28 05/15/2020   TSH 2.64 12/29/2018   INR 1.02 09/28/2014    US Abdomen Complete  Result Date: 05/20/2020 CLINICAL DATA:  Abdominal pain with nausea and vomiting. Cholecystectomy. EXAM: ABDOMEN ULTRASOUND COMPLETE COMPARISON:  None. FINDINGS: Gallbladder: Surgically absent. Common bile duct: Diameter: 3.5 mm Liver: No focal lesion identified. Within normal limits in parenchymal echogenicity. Portal vein is patent on color Doppler imaging with normal direction of blood flow towards the liver. IVC: No abnormality visualized. Pancreas: Visualized portion unremarkable. Spleen: Size and appearance within normal limits. Right Kidney: Length: 10.8 cm. Echogenicity within normal limits. No mass or hydronephrosis visualized. Left Kidney: Length: 10.8 cm. Echogenicity within normal limits. No mass or hydronephrosis visualized. Abdominal aorta: No aneurysm visualized. Other findings: None. IMPRESSION: 1. The patient is status post cholecystectomy. 2. No  other abnormalities. Electronically Signed   By: Dorise Bullion III M.D   On: 05/20/2020 17:14    Assessment & Plan:     Follow-up: No follow-ups on file.  Walker Kehr, MD

## 2020-07-25 NOTE — Assessment & Plan Note (Signed)
She will try to quit

## 2020-07-25 NOTE — Assessment & Plan Note (Addendum)
Refractory.  Reduce smoking.  Start Trelegy qd Medrol pack Call if not better.

## 2020-07-25 NOTE — Assessment & Plan Note (Signed)
Discussed.  She is planning to start a tobacco cessation program via her job.

## 2020-07-31 LAB — HM COLONOSCOPY

## 2020-08-04 ENCOUNTER — Encounter: Payer: Self-pay | Admitting: Internal Medicine

## 2020-08-14 ENCOUNTER — Ambulatory Visit: Payer: 59 | Admitting: Internal Medicine

## 2020-08-16 ENCOUNTER — Ambulatory Visit: Payer: 59 | Admitting: Internal Medicine

## 2020-08-16 ENCOUNTER — Other Ambulatory Visit: Payer: Self-pay

## 2020-08-16 ENCOUNTER — Encounter: Payer: Self-pay | Admitting: Internal Medicine

## 2020-08-16 DIAGNOSIS — F988 Other specified behavioral and emotional disorders with onset usually occurring in childhood and adolescence: Secondary | ICD-10-CM

## 2020-08-16 DIAGNOSIS — G43109 Migraine with aura, not intractable, without status migrainosus: Secondary | ICD-10-CM | POA: Diagnosis not present

## 2020-08-16 DIAGNOSIS — N951 Menopausal and female climacteric states: Secondary | ICD-10-CM | POA: Diagnosis not present

## 2020-08-16 MED ORDER — AMPHETAMINE-DEXTROAMPHET ER 30 MG PO CP24: 30.0000 mg | ORAL_CAPSULE | Freq: Every day | ORAL | 0 refills | Status: DC

## 2020-08-16 MED ORDER — CLONIDINE HCL 0.1 MG PO TABS
0.1000 mg | ORAL_TABLET | Freq: Every day | ORAL | 3 refills | Status: DC
Start: 2020-08-16 — End: 2020-11-14

## 2020-08-16 MED ORDER — AMPHETAMINE-DEXTROAMPHET ER 30 MG PO CP24: 30.0000 mg | ORAL_CAPSULE | ORAL | 0 refills | Status: DC

## 2020-08-16 NOTE — Assessment & Plan Note (Signed)
Imitrex, Zofran and diclofenac prn Fioricet prn severe HA

## 2020-08-16 NOTE — Assessment & Plan Note (Signed)
I will have to take over Adderall Rx  Potential benefits of a long term opioids use as well as potential risks (i.e. addiction risk, apnea etc) and complications (i.e. Somnolence, constipation and others) were explained to the patient and were aknowledged.

## 2020-08-16 NOTE — Assessment & Plan Note (Signed)
Severe sweats Try Clonidine at night

## 2020-08-16 NOTE — Progress Notes (Signed)
Subjective:  Patient ID: Shelly Green, female    DOB: [birth year]  Age: 59 y.o. MRN: [id number]  CC: Follow-up (3 month f/u)   HPI Shelly Green presents for ADD, sweats, HAs  Outpatient Medications Prior to Visit  Medication Sig Dispense Refill   amphetamine-dextroamphetamine (ADDERALL XR) 30 MG 24 hr capsule Take 1 capsule (30 mg total) by mouth every morning. 30 capsule 0   busPIRone (BUSPAR) 30 MG tablet TAKE 1 TABLET BY MOUTH 2 TIMES DAILY. 180 tablet 1   butalbital-acetaminophen-caffeine (FIORICET) 50-325-40 MG tablet TAKE 2 TABLETS BY MOUTH EVERY 6 (SIX) HOURS AS NEEDED. 40 tablet 1   Cholecalciferol (VITAMIN D3) 50 MCG (2000 UT) capsule Take 1 capsule (2,000 Units total) by mouth daily. 100 capsule 3   Fluticasone-Umeclidin-Vilant (TRELEGY ELLIPTA) 100-62.5-25 MCG/INH AEPB Inhale 1 puff into the lungs daily. 1 each 6   gabapentin (NEURONTIN) 100 MG capsule Take by mouth. Take 1 by mouth every day     letrozole (FEMARA) 2.5 MG tablet Take 2.5 mg by mouth daily.     Multiple Vitamin (MULTIVITAMIN WITH MINERALS) TABS tablet Take 1 tablet by mouth 2 (two) times daily.     omeprazole (PRILOSEC) 40 MG capsule Take 1 tablet by mouth daily. Take 1 by mouth every day     propranolol ER (INDERAL LA) 80 MG 24 hr capsule Take 80 mg by mouth daily 30 capsule 4   SUMAtriptan (IMITREX) 100 MG tablet TAKE 1 TAB BY MOUTH DAILY AS NEEDED FOR MIGRAINE MAY REPEAT IN 2HOURS IF HEADACHE PERSISTS OR RECURS 12 tablet 3   benzonatate (TESSALON) 200 MG capsule Take 1 capsule (200 mg total) by mouth 3 (three) times daily as needed for cough. (Patient not taking: Reported on 08/16/2020) 30 capsule 1   methylPREDNISolone (MEDROL DOSEPAK) 4 MG TBPK tablet As directed (Patient not taking: Reported on 08/16/2020) 21 tablet 0   No facility-administered medications prior to visit.    ROS: Review of Systems  Constitutional:  Positive for diaphoresis and fatigue. Negative for activity change, appetite  change, chills and unexpected weight change.  HENT:  Negative for congestion, mouth sores and sinus pressure.   Eyes:  Negative for visual disturbance.  Respiratory:  Negative for cough and chest tightness.   Gastrointestinal:  Negative for abdominal pain and nausea.  Genitourinary:  Negative for difficulty urinating, frequency and vaginal pain.  Musculoskeletal:  Negative for back pain and gait problem.  Skin:  Negative for pallor and rash.  Neurological:  Negative for dizziness, tremors, weakness, numbness and headaches.  Psychiatric/Behavioral:  Negative for confusion, sleep disturbance and suicidal ideas.    Objective:  BP 121/82 (BP Location: Left Arm)   Pulse (!) 59   Temp 98.6 F (37 C) (Oral)   Ht '5\' 2"'$  (1.575 m)   Wt 121 lb 3.2 oz (55 kg)   SpO2 98%   BMI 22.17 kg/m   BP Readings from Last 3 Encounters:  08/16/20 121/82  07/25/20 (!) 142/80  06/15/20 120/78    Wt Readings from Last 3 Encounters:  08/16/20 121 lb 3.2 oz (55 kg)  07/25/20 122 lb 6.4 oz (55.5 kg)  06/15/20 124 lb (56.2 kg)    Physical Exam Constitutional:      General: She is not in acute distress.    Appearance: She is well-developed.  HENT:     Head: Normocephalic.     Right Ear: External ear normal.     Left Ear: External ear normal.  Nose: Nose normal.  Eyes:     General:        Right eye: No discharge.        Left eye: No discharge.     Conjunctiva/sclera: Conjunctivae normal.     Pupils: Pupils are equal, round, and reactive to light.  Neck:     Thyroid: No thyromegaly.     Vascular: No JVD.     Trachea: No tracheal deviation.  Cardiovascular:     Rate and Rhythm: Normal rate and regular rhythm.     Heart sounds: Normal heart sounds.  Pulmonary:     Effort: No respiratory distress.     Breath sounds: No stridor. No wheezing.  Abdominal:     General: Bowel sounds are normal. There is no distension.     Palpations: Abdomen is soft. There is no mass.     Tenderness: There is  no abdominal tenderness. There is no guarding or rebound.  Musculoskeletal:        General: No tenderness.     Cervical back: Normal range of motion and neck supple. No rigidity.  Lymphadenopathy:     Cervical: No cervical adenopathy.  Skin:    Findings: No erythema or rash.  Neurological:     Mental Status: She is oriented to person, place, and time.     Cranial Nerves: No cranial nerve deficit.     Motor: No abnormal muscle tone.     Coordination: Coordination normal.     Deep Tendon Reflexes: Reflexes normal.  Psychiatric:        Behavior: Behavior normal.        Thought Content: Thought content normal.        Judgment: Judgment normal.    Lab Results  Component Value Date   WBC 14.4 (H) 05/15/2020   HGB 16.1 (H) 05/15/2020   HCT 47.9 (H) 05/15/2020   PLT 210.0 05/15/2020   GLUCOSE 87 05/15/2020   CHOL 145 12/29/2018   TRIG 62.0 12/29/2018   HDL 47.70 12/29/2018   LDLCALC 85 12/29/2018   ALT 14 05/15/2020   AST 16 05/15/2020   NA 146 (H) 05/15/2020   K 3.9 05/15/2020   CL 107 05/15/2020   CREATININE 0.87 05/15/2020   BUN 19 05/15/2020   CO2 28 05/15/2020   TSH 2.64 12/29/2018   INR 1.02 09/28/2014    US Abdomen Complete  Result Date: 05/20/2020 CLINICAL DATA:  Abdominal pain with nausea and vomiting. Cholecystectomy. EXAM: ABDOMEN ULTRASOUND COMPLETE COMPARISON:  None. FINDINGS: Gallbladder: Surgically absent. Common bile duct: Diameter: 3.5 mm Liver: No focal lesion identified. Within normal limits in parenchymal echogenicity. Portal vein is patent on color Doppler imaging with normal direction of blood flow towards the liver. IVC: No abnormality visualized. Pancreas: Visualized portion unremarkable. Spleen: Size and appearance within normal limits. Right Kidney: Length: 10.8 cm. Echogenicity within normal limits. No mass or hydronephrosis visualized. Left Kidney: Length: 10.8 cm. Echogenicity within normal limits. No mass or hydronephrosis visualized. Abdominal  aorta: No aneurysm visualized. Other findings: None. IMPRESSION: 1. The patient is status post cholecystectomy. 2. No other abnormalities. Electronically Signed   By: Dorise Bullion III M.D   On: 05/20/2020 17:14    Assessment & Plan:     Walker Kehr, MD

## 2020-08-22 ENCOUNTER — Telehealth: Payer: Self-pay | Admitting: *Deleted

## 2020-08-22 NOTE — Telephone Encounter (Signed)
-----   Message from Marguarite Arbour sent at 08/21/2020 11:52 AM EDT ----- Prescriber response form

## 2020-08-22 NOTE — Telephone Encounter (Signed)
Completed faxed back to [email or web address] Green

## 2020-09-19 ENCOUNTER — Other Ambulatory Visit: Payer: Self-pay

## 2020-09-19 ENCOUNTER — Ambulatory Visit: Payer: 59 | Admitting: Family Medicine

## 2020-09-19 VITALS — Wt 122.0 lb

## 2020-09-19 DIAGNOSIS — M9903 Segmental and somatic dysfunction of lumbar region: Secondary | ICD-10-CM

## 2020-09-19 DIAGNOSIS — M545 Low back pain, unspecified: Secondary | ICD-10-CM

## 2020-09-19 DIAGNOSIS — M9904 Segmental and somatic dysfunction of sacral region: Secondary | ICD-10-CM

## 2020-09-19 DIAGNOSIS — M9901 Segmental and somatic dysfunction of cervical region: Secondary | ICD-10-CM | POA: Diagnosis not present

## 2020-09-19 DIAGNOSIS — M999 Biomechanical lesion, unspecified: Secondary | ICD-10-CM

## 2020-09-19 DIAGNOSIS — M9902 Segmental and somatic dysfunction of thoracic region: Secondary | ICD-10-CM

## 2020-09-19 DIAGNOSIS — M9908 Segmental and somatic dysfunction of rib cage: Secondary | ICD-10-CM

## 2020-09-19 DIAGNOSIS — G8929 Other chronic pain: Secondary | ICD-10-CM

## 2020-09-19 NOTE — Assessment & Plan Note (Signed)
Chronic low back pain.  On the neck did only muscle energy.  Does have a history of breast cancer.  Being followed.  We discussed other imaging possibilities.  Patient wants to hold at this time.  Follow-up with me again in 6 to 8 weeks.  Chronic problem with mild exacerbation.

## 2020-09-19 NOTE — Progress Notes (Signed)
[facility] [place] Phone: [phone] Subjective:    I'm seeing this patient by the request  of:  Plotnikov, Evie Lacks, MD  CC: Neck and back pain follow-up  [id number]  Shelly Green is a 60 y.o. female coming in with complaint of neck and back pain follow-up.  Patient has had a recent migraine.  Feels like it is time for osteopathic manipulation.  Patient is being followed by multiple providers including neurology, primary care as well as oncologist.  We did review some of these notes.  Patient states that overall she has been doing relatively okay but this is secondary to her not being quite as active.  Patient is concerned that when she starts becoming more active she will have more discomfort again.     Past Medical History:  Diagnosis Date   Arthritis    Cancer ([place])    breast    History of colon polyps    IBS (irritable bowel syndrome)    Menopausal disorder    Migraines    Seasonal allergies    Social anxiety disorder    Past Surgical History:  Procedure Laterality Date   CARPAL TUNNEL RELEASE  08/20/2011   Procedure: CARPAL TUNNEL RELEASE;  Surgeon: Cammie Sickle., MD;  Location: [facility];  Service: Orthopedics;  Laterality: Right;   HEMORROIDECTOMY     LAPAROSCOPIC CHOLECYSTECTOMY  2007   TOTAL ABDOMINAL HYSTERECTOMY     Social History   Socioeconomic History   Marital status: Married    Spouse name: Not on file   Number of children: Not on file   Years of education: Not on file   Highest education level: Not on file  Occupational History   Not on file  Tobacco Use   Smoking status: Former    Packs/day: 1.50    Years: 35.00    Pack years: 52.50    Types: Cigarettes    Quit date: 01/24/2017    Years since quitting: 3.6   Smokeless tobacco: Never  Substance and Sexual Activity   Alcohol use: No    Alcohol/week: 0.0 standard drinks   Drug use: No   Sexual  activity: Yes  Other Topics Concern   Not on file  Social History Narrative   Right handed   Social Determinants of Health   Financial Resource Strain: Not on file  Food Insecurity: Not on file  Transportation Needs: Not on file  Physical Activity: Not on file  Stress: Not on file  Social Connections: Not on file   Allergies  Allergen Reactions   Codeine Anaphylaxis   Hydrocodone Shortness Of Breath   Penicillins Anaphylaxis    As a child Has patient had a PCN reaction causing immediate rash, facial/tongue/throat swelling, SOB or lightheadedness with hypotension: yes Has patient had a PCN reaction causing severe rash involving mucus membranes or skin necrosis: unknown Has patient had a PCN reaction that required hospitalization: yes Has patient had a PCN reaction occurring within the last 10 years: no If all of the above answers are "NO", then may proceed with Cephalosporin use.    Sulfa Antibiotics Anaphylaxis and Rash   Tramadol Shortness Of Breath   Clindamycin/Lincomycin     rush   Wellbutrin [Bupropion]     HAs   Family History  Problem Relation Age of Onset   Arthritis Mother    Lymphoma Mother    Colon cancer Sister 22   Mental  illness Daughter    Mental illness Maternal Aunt    Arthritis Maternal Grandmother    Hypertension Maternal Grandfather      Current Outpatient Medications (Cardiovascular):    cloNIDine (CATAPRES) 0.1 MG tablet, Take 1 tablet (0.1 mg total) by mouth at bedtime.   propranolol ER (INDERAL LA) 80 MG 24 hr capsule, Take 80 mg by mouth daily  Current Outpatient Medications (Respiratory):    Fluticasone-Umeclidin-Vilant (TRELEGY ELLIPTA) 100-62.5-25 MCG/INH AEPB, Inhale 1 puff into the lungs daily.  Current Outpatient Medications (Analgesics):    butalbital-acetaminophen-caffeine (FIORICET) 50-325-40 MG tablet, TAKE 2 TABLETS BY MOUTH EVERY 6 (SIX) HOURS AS NEEDED.   SUMAtriptan (IMITREX) 100 MG tablet, TAKE 1 TAB BY MOUTH DAILY AS  NEEDED FOR MIGRAINE MAY REPEAT IN 2HOURS IF HEADACHE PERSISTS OR RECURS   Current Outpatient Medications (Other):    amphetamine-dextroamphetamine (ADDERALL XR) 30 MG 24 hr capsule, Take 1 capsule (30 mg total) by mouth every morning.   amphetamine-dextroamphetamine (ADDERALL XR) 30 MG 24 hr capsule, Take 1 capsule (30 mg total) by mouth every morning.   amphetamine-dextroamphetamine (ADDERALL XR) 30 MG 24 hr capsule, Take 1 capsule (30 mg total) by mouth daily.   busPIRone (BUSPAR) 30 MG tablet, TAKE 1 TABLET BY MOUTH 2 TIMES DAILY.   Cholecalciferol (VITAMIN D3) 50 MCG (2000 UT) capsule, Take 1 capsule (2,000 Units total) by mouth daily.   gabapentin (NEURONTIN) 100 MG capsule, Take by mouth. Take 1 by mouth every day   letrozole (FEMARA) 2.5 MG tablet, Take 2.5 mg by mouth daily.   Multiple Vitamin (MULTIVITAMIN WITH MINERALS) TABS tablet, Take 1 tablet by mouth 2 (two) times daily.   omeprazole (PRILOSEC) 40 MG capsule, Take 1 tablet by mouth daily. Take 1 by mouth every day   Reviewed prior external information including notes and imaging from  primary care provider As well as notes that were available from care everywhere and other healthcare systems.  Past medical history, social, surgical and family history all reviewed in electronic medical record.  No pertanent information unless stated regarding to the chief complaint.   Review of Systems:  No headache, visual changes, nausea, vomiting, diarrhea, constipation, dizziness, abdominal pain, skin rash, fevers, chills, night sweats, weight loss, swollen lymph nodes, body aches, joint swelling, chest pain, shortness of breath, mood changes. POSITIVE muscle aches  Objective  Weight 122 lb (55.3 kg).   General: No apparent distress alert and oriented x3 mood and affect normal, dressed appropriately.  HEENT: Pupils equal, extraocular movements intact  Respiratory: Patient's speak in full sentences and does not appear short of breath   Cardiovascular: No lower extremity edema, non tender, no erythema  Gait normal with good balance and coordination.  MSK: Neck exam does have some mild loss of lordosis.  Some tenderness to palpation of the parascapular region.  Patient does have some tightness of the low back also noted today.  No radicular symptoms noted.  Osteopathic findings C2 flexed rotated and side bent right C4 flexed rotated and side bent left C6 flexed rotated and side bent left T3 extended rotated and side bent right inhaled third rib T9 extended rotated and side bent left L2 flexed rotated and side bent right Sacrum right on right     Impression and Recommendations:     The above documentation has been reviewed and is accurate and complete Lyndal Pulley, DO

## 2020-09-19 NOTE — Assessment & Plan Note (Signed)
   Decision today to treat with OMT was based on Physical Exam  After verbal consent patient was treated with  ME, FPR techniques in cervical, thoracic, rib, lumbar and sacral areas, all areas are chronic   Patient tolerated the procedure well with improvement in symptoms  Patient given exercises, stretches and lifestyle modifications  See medications in patient instructions if given  Patient will follow up in 4-8 weeks 

## 2020-09-19 NOTE — Patient Instructions (Signed)
Great to see you  Shelly Green is your friend 2 tennis ball in a tube sock and lay on them where head meets neck  US ea shoes box to bring the activity up a little higher Try to do the exercises 2-3 times a week  See me again in 2-3 months.

## 2020-09-21 ENCOUNTER — Telehealth: Payer: Self-pay | Admitting: Neurology

## 2020-09-21 NOTE — Telephone Encounter (Signed)
Pt called to let jaffe know her headaches have come back. She has had one 4 days in a row. She has taken meds and it still has not helped. She was wondering  if her meds needs to be upped or if he wants to see her.

## 2020-09-21 NOTE — Telephone Encounter (Signed)
Pt states the headache is better now but it took til this afternoon to go away.  Pt just wants to know if she should increase her Propranolol?   Asked pt if she wanted a Prednisone taper called in she stated if Dr.Jaffe feels like I need it. The headache has gotten better.

## 2020-09-22 NOTE — Telephone Encounter (Signed)
PER Dr.Jaffe, If it makes her more comfortable, we can increase the propranolol ER to '120mg'$  daily.  However, it may be reasonable to wait.  A preventative medication cannot prevent all bad headaches.  If her headaches remained controlled until this week, then she may have just had a bad week.  If going forward we establish that she continues to have more frequent headaches, then we should definitely increase the propranolol.   Pt agrees to wait to see if she has more frequent headaches.

## 2020-09-23 ENCOUNTER — Other Ambulatory Visit: Payer: Self-pay

## 2020-09-23 ENCOUNTER — Emergency Department (HOSPITAL_BASED_OUTPATIENT_CLINIC_OR_DEPARTMENT_OTHER)
Admission: EM | Admit: 2020-09-23 | Discharge: 2020-09-23 | Disposition: A | Discharge status: Home / Self Care | Payer: 59 | Attending: Emergency Medicine | Admitting: Emergency Medicine

## 2020-09-23 ENCOUNTER — Encounter (HOSPITAL_BASED_OUTPATIENT_CLINIC_OR_DEPARTMENT_OTHER): Payer: Self-pay

## 2020-09-23 DIAGNOSIS — S0502XA Injury of conjunctiva and corneal abrasion without foreign body, left eye, initial encounter: Secondary | ICD-10-CM | POA: Diagnosis not present

## 2020-09-23 DIAGNOSIS — Z853 Personal history of malignant neoplasm of breast: Secondary | ICD-10-CM | POA: Insufficient documentation

## 2020-09-23 DIAGNOSIS — X58XXXA Exposure to other specified factors, initial encounter: Secondary | ICD-10-CM | POA: Diagnosis not present

## 2020-09-23 DIAGNOSIS — F17210 Nicotine dependence, cigarettes, uncomplicated: Secondary | ICD-10-CM | POA: Diagnosis not present

## 2020-09-23 DIAGNOSIS — H1132 Conjunctival hemorrhage, left eye: Secondary | ICD-10-CM

## 2020-09-23 DIAGNOSIS — Z86010 Personal history of colonic polyps: Secondary | ICD-10-CM | POA: Insufficient documentation

## 2020-09-23 DIAGNOSIS — S0592XA Unspecified injury of left eye and orbit, initial encounter: Secondary | ICD-10-CM | POA: Diagnosis present

## 2020-09-23 MED ORDER — ERYTHROMYCIN 5 MG/GM OP OINT
TOPICAL_OINTMENT | Freq: Four times a day (QID) | OPHTHALMIC | Status: DC
  Administered 2020-09-23: 1 via OPHTHALMIC
  Filled 2020-09-23: qty 3.5

## 2020-09-23 MED ORDER — FLUORESCEIN SODIUM 1 MG OP STRP
ORAL_STRIP | OPHTHALMIC | Status: AC
  Filled 2020-09-23: qty 1

## 2020-09-23 MED ORDER — TETRACAINE HCL 0.5 % OP SOLN
OPHTHALMIC | Status: AC
  Filled 2020-09-23: qty 4

## 2020-09-23 NOTE — ED Notes (Signed)
Pt verbalizes understanding of discharge instructions. Opportunity for questioning and answers were provided. Armand removed by staff, pt discharged from ED to home. Educated to f/u.

## 2020-09-23 NOTE — ED Triage Notes (Addendum)
Pt was pulling weeds this morning when she poked her left eye with a stick by accident. Pt states her left eye is very painful. Small abrasions noted on the outside of the eye. Denies blurred vision or difficulty seeing out of eye. Pt was wearing glasses when it happened. Cleansed eye out at home. Visual acuity performed.

## 2020-09-23 NOTE — ED Provider Notes (Signed)
[facility] Provider Note: Shelly Spurling, MD, FACEP  CSN: [id number] MRN: [id number] ARRIVAL: 09/23/20 at 2140 ROOM: [facility]   HISTORY OF PRESENT ILLNESS  09/23/20 10:47 PM Shelly Green is a 60 y.o. female who was pulling weeds this morning when a weed snapped loose from the roots and she poked her left eye.  She was wearing glasses when this happened but the cystic came in sideways from the left.  She is not having pain in the left eye along with erythema of the conjunctiva laterally.  She is having minimal blurriness of the vision in the left eye.  She rates her eye pain as a 6 out of 10.  It is not significantly worse with exposure to light.  She describes it as throbbing as well as a discomfort.   Past Medical History:  Diagnosis Date   Arthritis    Cancer ([place])    breast    History of colon polyps    IBS (irritable bowel syndrome)    Menopausal disorder    Migraines    Seasonal allergies    Social anxiety disorder     Past Surgical History:  Procedure Laterality Date   CARPAL TUNNEL RELEASE  08/20/2011   Procedure: CARPAL TUNNEL RELEASE;  Surgeon: Cammie Sickle., MD;  Location: [facility];  Service: Orthopedics;  Laterality: Right;   HEMORROIDECTOMY     LAPAROSCOPIC CHOLECYSTECTOMY  2007   TOTAL ABDOMINAL HYSTERECTOMY      Family History  Problem Relation Age of Onset   Arthritis Mother    Lymphoma Mother    Colon cancer Sister 49   Mental illness Daughter    Mental illness Maternal Aunt    Arthritis Maternal Grandmother    Hypertension Maternal Grandfather     Social History   Tobacco Use   Smoking status: Every Day    Packs/day: 1.00    Years: 35.00    Pack years: 35.00    Types: Cigarettes   Smokeless tobacco: Never  Substance Use Topics   Alcohol use: No    Alcohol/week: 0.0 standard drinks   Drug use: No    Prior to Admission medications   Medication Sig Start Date End Date  Taking? Authorizing Provider  amphetamine-dextroamphetamine (ADDERALL XR) 30 MG 24 hr capsule Take 1 capsule (30 mg total) by mouth every morning. 08/16/20   Plotnikov, Evie Lacks, MD  busPIRone (BUSPAR) 30 MG tablet TAKE 1 TABLET BY MOUTH 2 TIMES DAILY. 12/06/19   Plotnikov, Evie Lacks, MD  butalbital-acetaminophen-caffeine (FIORICET) 50-325-40 MG tablet TAKE 2 TABLETS BY MOUTH EVERY 6 (SIX) HOURS AS NEEDED. 01/12/20   Plotnikov, Evie Lacks, MD  Cholecalciferol (VITAMIN D3) 50 MCG (2000 UT) capsule Take 1 capsule (2,000 Units total) by mouth daily. 12/29/18   Plotnikov, Evie Lacks, MD  cloNIDine (CATAPRES) 0.1 MG tablet Take 1 tablet (0.1 mg total) by mouth at bedtime. 08/16/20   Plotnikov, Evie Lacks, MD  Fluticasone-Umeclidin-Vilant (TRELEGY ELLIPTA) 100-62.5-25 MCG/INH AEPB Inhale 1 puff into the lungs daily. 07/25/20   Plotnikov, Evie Lacks, MD  gabapentin (NEURONTIN) 100 MG capsule Take by mouth. Take 1 by mouth every day 05/25/20   [provider]  letrozole (FEMARA) 2.5 MG tablet Take 2.5 mg by mouth daily. 04/29/19   [provider]  Multiple Vitamin (MULTIVITAMIN WITH MINERALS) TABS tablet Take 1 tablet by mouth 2 (two) times daily.    [provider]  omeprazole (PRILOSEC) 40  MG capsule Take 1 tablet by mouth daily. Take 1 by mouth every day 08/14/20   [provider]  propranolol ER (INDERAL LA) 80 MG 24 hr capsule Take 80 mg by mouth daily 07/03/20   Metta Clines R, DO  SUMAtriptan (IMITREX) 100 MG tablet TAKE 1 TAB BY MOUTH DAILY AS NEEDED FOR MIGRAINE MAY REPEAT IN 2HOURS IF HEADACHE PERSISTS OR RECURS 06/19/20   Plotnikov, Evie Lacks, MD    Allergies Codeine, Hydrocodone, Penicillins, Sulfa antibiotics, Tramadol, Clindamycin/lincomycin, and Wellbutrin [bupropion]   REVIEW OF SYSTEMS  Negative except as noted here or in the History of Present Illness.   PHYSICAL EXAMINATION  Initial Vital Signs Blood pressure (!) 156/99, pulse 72, temperature 97.7 F (36.5 C),  temperature source Oral, resp. rate 16, height '5\' 2"'$  (1.575 m), weight 55.3 kg, SpO2 100 %.  Examination General: Well-developed, well-nourished female in no acute distress; appearance consistent with age of record HENT: normocephalic; small left periorbital abrasion Eyes: pupils equal, round and reactive to light; extraocular muscles intact; left lateral bulbar conjunctival abrasion and subconjunctival hemorrhage; no globe penetration seen; no corneal fluorescein uptake Neck: supple Heart: regular rate and rhythm Lungs: clear to auscultation bilaterally Abdomen: soft; nondistended; nontender; bowel sounds present Extremities: No deformity; full range of motion Neurologic: Awake, alert and oriented; motor function intact in all extremities and symmetric; no facial droop Skin: Warm and dry Psychiatric: Normal mood and affect   RESULTS  Summary of this visit's results, reviewed and interpreted by myself:   EKG Interpretation  Date/Time:    Ventricular Rate:    PR Interval:    QRS Duration:   QT Interval:    QTC Calculation:   R Axis:     Text Interpretation:         Laboratory Studies: No results found for this or any previous visit (from the past 24 hour(s)). Imaging Studies: No results found.  ED COURSE and MDM  Nursing notes, initial and subsequent vitals signs, including pulse oximetry, reviewed and interpreted by myself.  Vitals:   09/23/20 2148  BP: (!) 156/99  Pulse: 72  Resp: 16  Temp: 97.7 F (36.5 C)  TempSrc: Oral  SpO2: 100%  Weight: 55.3 kg  Height: '5\' 2"'$  (1.575 m)   Medications  tetracaine (PONTOCAINE) 0.5 % ophthalmic solution (has no administration in time range)  fluorescein 1 MG ophthalmic strip (has no administration in time range)  erythromycin ophthalmic ointment (has no administration in time range)   The patient's discomfort was relieved with topical tetracaine suggesting the injury is superficial.  No globe penetration was seen.  No  corneal injury was seen.  We will treat her with topical erythromycin ointment and refer to the ophthalmologist on-call for follow-up to ensure healing.  Visual acuity (with glasses): OD 20/40; OS 20/40; together 20/25   PROCEDURES  Procedures   ED DIAGNOSES     ICD-10-CM   1. Abrasion of left conjunctiva, initial encounter  S05.02XA     2. Subconjunctival hemorrhage, traumatic, left  H11.32          Tavionna Grout, Jenny Reichmann, MD 09/23/20 2305

## 2020-09-30 ENCOUNTER — Telehealth: Payer: 59 | Admitting: Nurse Practitioner

## 2020-09-30 DIAGNOSIS — U071 COVID-19: Secondary | ICD-10-CM

## 2020-09-30 MED ORDER — MOLNUPIRAVIR EUA 200MG CAPSULE: 4.0000 | ORAL_CAPSULE | Freq: Two times a day (BID) | ORAL | 0 refills | Status: AC

## 2020-09-30 NOTE — Patient Instructions (Signed)
You are being prescribed MOLNUPIRAVIR for COVID-19 infection.  ° ° °Please call the pharmacy or go through the drive through vs going inside if you are picking up the mediation yourself to prevent further spread. If prescribed to a [facility] affiliated pharmacy, a pharmacist will bring the medication out to your car. ° ° °ADMINISTRATION INSTRUCTIONS: °Take with or without food. Swallow the tablets whole. Don't chew, crush, or break the medications because it might not work as well ° °For each dose of the medication, you should be taking FOUR tablets at one time, TWICE a day  ° °Finish your full five-day course of Molnupiravir even if you feel better before you're done. Stopping this medication too early can make it less effective to prevent severe illness related to COVID19.   ° °Molnupiravir is prescribed for YOU ONLY. Don't share it with others, even if they have similar symptoms as you. This medication might not be right for everyone.  ° °Make sure to take steps to protect yourself and others while you're taking this medication in order to get well soon and to prevent others from getting sick with COVID-19. ° ° °**If you are of childbearing potential (any gender) - it is advised to not get pregnant while taking this medication and recommended that condoms are used for female partners the next 3 months after taking the medication out of extreme caution  ° ° °COMMON SIDE EFFECTS: °Diarrhea °Nausea  °Dizziness ° ° ° °If your COVID-19 symptoms get worse, get medical help right away. Call 911 if you experience symptoms such as worsening cough, trouble breathing, chest pain that doesn't go away, confusion, a hard time staying awake, and pale or blue-colored skin. °This medication won't prevent all COVID-19 cases from getting worse.  ° ° °

## 2020-09-30 NOTE — Progress Notes (Signed)
Virtual Visit Consent   Shelly Green, you are scheduled for a virtual visit with Mary-Margaret Hassell Done, Ama, a [facility] provider, today.     Just as with appointments in the office, your consent must be obtained to participate.  Your consent will be active for this visit and any virtual visit you may have with one of our providers in the next 365 days.     If you have a MyChart account, a copy of this consent can be sent to you electronically.  All virtual visits are billed to your insurance company just like a traditional visit in the office.    As this is a virtual visit, video technology does not allow for your provider to perform a traditional examination.  This may limit your provider's ability to fully assess your condition.  If your provider identifies any concerns that need to be evaluated in person or the need to arrange testing (such as labs, EKG, etc.), we will make arrangements to do so.     Although advances in technology are sophisticated, we cannot ensure that it will always work on either your end or our end.  If the connection with a video visit is poor, the visit may have to be switched to a telephone visit.  With either a video or telephone visit, we are not always able to ensure that we have a secure connection.     I need to obtain your verbal consent now.   Are you willing to proceed with your visit today? YES   Shelly Green has provided verbal consent on 09/30/2020 for a virtual visit (video or telephone).   Mary-Margaret Hassell Done, FNP   Date: 09/30/2020 10:07 AM   Virtual Visit via Video Note   I, Mary-Margaret Hassell Done, connected with Shelly Green ([id number], [birth year]) on 09/30/20 at 10:15 AM EDT by a video-enabled telemedicine application and verified that I am speaking with the correct person using two identifiers.  Location: Patient: Virtual Visit Location Patient: Home Provider: Virtual Visit Location Provider: Mobile   I discussed the  limitations of evaluation and management by telemedicine and the availability of in person appointments. The patient expressed understanding and agreed to proceed.    History of Present Illness: Shelly Green is a 60 y.o. who identifies as a female who was assigned female at birth, and is being seen today for covid positive.  HPI: Patient states that Wednesday she started feeling bad. She was extremely fatigued, runny nose, headache and slight cough. Her husband had tested positive for covid Tuesday. She decided toi test herself for covid  on Wednesday and was positive. Since then she her cough has gotten worse. She has had intermittent headaches. She has been taking nothing since she was dx.    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   Anxiety disorder 02/10/2019   Nonallopathic lesion of sacral region 01/19/2019   Nonallopathic lesion of lumbosacral region 01/19/2019   Nonallopathic lesion of thoracic region 01/19/2019   Well adult exam 12/29/2018   Ganglion cyst of joint of finger of right hand 12/15/2018   Hand pain, right 12/03/2018   ADD (attention deficit disorder) 12/03/2018   Urinary incontinence 03/07/2017   Abscess of right leg 12/25/2015   Breast cancer ([place]) 12/25/2015   Rash 12/25/2015   Hypokalemia 12/25/2015   Tobacco abuse  12/25/2015   Cellulitis 12/25/2015   Breast lump in female 08/23/2015   Cough 08/23/2015   Migraine headache with aura 04/05/2015   IBS (irritable bowel syndrome) 11/15/2014   Acute blood loss anemia 09/30/2014   Spleen laceration 09/28/2014   Low libido 11/12/2013   Abdominal pain 09/24/2013   Chronic low back pain 09/24/2013   Tobacco use disorder 09/24/2013   Menopause syndrome 09/28/2012    Allergies:  Allergies  Allergen Reactions   Codeine Anaphylaxis   Hydrocodone Shortness Of Breath    Penicillins Anaphylaxis    As a child Has patient had a PCN reaction causing immediate rash, facial/tongue/throat swelling, SOB or lightheadedness with hypotension: yes Has patient had a PCN reaction causing severe rash involving mucus membranes or skin necrosis: unknown Has patient had a PCN reaction that required hospitalization: yes Has patient had a PCN reaction occurring within the last 10 years: no If all of the above answers are "NO", then may proceed with Cephalosporin use.    Sulfa Antibiotics Anaphylaxis and Rash   Tramadol Shortness Of Breath   Clindamycin/Lincomycin     rush   Wellbutrin [Bupropion]     HAs   Medications:  Current Outpatient Medications:    amphetamine-dextroamphetamine (ADDERALL XR) 30 MG 24 hr capsule, Take 1 capsule (30 mg total) by mouth every morning., Disp: 30 capsule, Rfl: 0   busPIRone (BUSPAR) 30 MG tablet, TAKE 1 TABLET BY MOUTH 2 TIMES DAILY., Disp: 180 tablet, Rfl: 1   butalbital-acetaminophen-caffeine (FIORICET) 50-325-40 MG tablet, TAKE 2 TABLETS BY MOUTH EVERY 6 (SIX) HOURS AS NEEDED., Disp: 40 tablet, Rfl: 1   Cholecalciferol (VITAMIN D3) 50 MCG (2000 UT) capsule, Take 1 capsule (2,000 Units total) by mouth daily., Disp: 100 capsule, Rfl: 3   cloNIDine (CATAPRES) 0.1 MG tablet, Take 1 tablet (0.1 mg total) by mouth at bedtime., Disp: 30 tablet, Rfl: 3   Fluticasone-Umeclidin-Vilant (TRELEGY ELLIPTA) 100-62.5-25 MCG/INH AEPB, Inhale 1 puff into the lungs daily., Disp: 1 each, Rfl: 6   gabapentin (NEURONTIN) 100 MG capsule, Take by mouth. Take 1 by mouth every day, Disp: , Rfl:    letrozole (FEMARA) 2.5 MG tablet, Take 2.5 mg by mouth daily., Disp: , Rfl:    Multiple Vitamin (MULTIVITAMIN WITH MINERALS) TABS tablet, Take 1 tablet by mouth 2 (two) times daily., Disp: , Rfl:    omeprazole (PRILOSEC) 40 MG capsule, Take 1 tablet by mouth daily. Take 1 by mouth every day, Disp: , Rfl:    propranolol ER (INDERAL LA) 80 MG 24 hr capsule, Take 80 mg  by mouth daily, Disp: 30 capsule, Rfl: 4   SUMAtriptan (IMITREX) 100 MG tablet, TAKE 1 TAB BY MOUTH DAILY AS NEEDED FOR MIGRAINE MAY REPEAT IN 2HOURS IF HEADACHE PERSISTS OR RECURS, Disp: 12 tablet, Rfl: 3  Observations/Objective: Patient is well-developed, well-nourished in no acute distress.  Resting comfortably  at home.  Head is normocephalic, atraumatic.  No labored breathing.  Speech is clear and coherent with logical content.  Patient is alert and oriented at baseline.  Voice hoarse Wet cugh noted during visit.  Assessment and Plan:  Shelly Green in today with chief complaint of Covid Positive   1. Lab test positive for detection of COVID-19 virus 1. Take meds as prescribed 2. Use a cool mist humidifier especially during the winter months and when heat has been humid. 3. Use saline nose sprays frequently 4. Saline irrigations of the nose can be very helpful if done frequently.  * 4X  daily for 1 week*  * Use of a nettie pot can be helpful with this. Follow directions with this* 5. Drink plenty of fluids 6. Keep thermostat turn down low 7.For any cough or congestion  Use plain Mucinex- regular strength or max strength is fine   * Children- consult with Pharmacist for dosing 8. For fever or aces or pains- take tylenol or ibuprofen appropriate for age and weight.  * for fevers greater than 101 orally you may alternate ibuprofen and tylenol every  3 hours.   Quarantine for 5 days  Meds ordered this encounter  Medications   molnupiravir EUA 200 mg CAPS    Sig: Take 4 capsules (800 mg total) by mouth 2 (two) times daily for 5 days.    Dispense:  40 capsule    Refill:  0    Order Specific Question:   Supervising Provider    Answer:   Noemi Chapel [[id number]]       Follow Up Instructions: I discussed the assessment and treatment plan with the patient. The patient was provided an opportunity to ask questions and all were answered. The patient agreed with the plan and  demonstrated an understanding of the instructions.  A copy of instructions were sent to the patient via MyChart.  The patient was advised to call back or seek an in-person evaluation if the symptoms worsen or if the condition fails to improve as anticipated.  Time:  I spent 12 minutes with the patient via telehealth technology discussing the above problems/concerns.    Mary-Margaret Hassell Done, FNP

## 2020-10-06 ENCOUNTER — Other Ambulatory Visit: Payer: Self-pay | Admitting: Neurology

## 2020-10-30 ENCOUNTER — Telehealth: Payer: Self-pay | Admitting: Internal Medicine

## 2020-10-30 NOTE — Telephone Encounter (Signed)
PT requesting refill amphetamine-dextroamphetamine (ADDERALL XR) 30 MG 24 hr capsule   [id number] - [place], [place] - [address]  Next visit:11-22-2020  Last visit:08-16-2020

## 2020-11-01 MED ORDER — AMPHETAMINE-DEXTROAMPHET ER 30 MG PO CP24: 30.0000 mg | ORAL_CAPSULE | ORAL | 0 refills | Status: DC

## 2020-11-01 NOTE — Telephone Encounter (Signed)
Okay.  Thanks.

## 2020-11-10 NOTE — Progress Notes (Deleted)
[facility] [place] Phone: [phone] Subjective:    I'm seeing this patient by the request  of:  Plotnikov, Evie Lacks, MD  CC:   [id number]  WILDA WETHERELL is a 60 y.o. female coming in with complaint of back and neck pain> OMT 09/19/2020. Patient states   Medications patient has been prescribed: None  Taking:         Reviewed prior external information including notes and imaging from previsou exam, outside providers and external EMR if available.   As well as notes that were available from care everywhere and other healthcare systems.  Past medical history, social, surgical and family history all reviewed in electronic medical record.  No pertanent information unless stated regarding to the chief complaint.   Past Medical History:  Diagnosis Date   Arthritis    Cancer ([place])    breast    History of colon polyps    IBS (irritable bowel syndrome)    Menopausal disorder    Migraines    Seasonal allergies    Social anxiety disorder     Allergies  Allergen Reactions   Codeine Anaphylaxis   Hydrocodone Shortness Of Breath   Penicillins Anaphylaxis    As a child Has patient had a PCN reaction causing immediate rash, facial/tongue/throat swelling, SOB or lightheadedness with hypotension: yes Has patient had a PCN reaction causing severe rash involving mucus membranes or skin necrosis: unknown Has patient had a PCN reaction that required hospitalization: yes Has patient had a PCN reaction occurring within the last 10 years: no If all of the above answers are "NO", then may proceed with Cephalosporin use.    Sulfa Antibiotics Anaphylaxis and Rash   Tramadol Shortness Of Breath   Clindamycin/Lincomycin     rush   Wellbutrin [Bupropion]     HAs     Review of Systems:  No headache, visual changes, nausea, vomiting, diarrhea, constipation, dizziness, abdominal pain, skin rash, fevers, chills, night  sweats, weight loss, swollen lymph nodes, body aches, joint swelling, chest pain, shortness of breath, mood changes. POSITIVE muscle aches  Objective  There were no vitals taken for this visit.   General: No apparent distress alert and oriented x3 mood and affect normal, dressed appropriately.  HEENT: Pupils equal, extraocular movements intact  Respiratory: Patient's speak in full sentences and does not appear short of breath  Cardiovascular: No lower extremity edema, non tender, no erythema  Neuro: Cranial nerves II through XII are intact, neurovascularly intact in all extremities with 2+ DTRs and 2+ pulses.  Gait normal with good balance and coordination.  MSK:  Non tender with full range of motion and good stability and symmetric strength and tone of shoulders, elbows, wrist, hip, knee and ankles bilaterally.  Back - Normal skin, Spine with normal alignment and no deformity.  No tenderness to vertebral process palpation.  Paraspinous muscles are not tender and without spasm.   Range of motion is full at neck and lumbar sacral regions  Osteopathic findings  C2 flexed rotated and side bent right C6 flexed rotated and side bent left T3 extended rotated and side bent right inhaled rib T9 extended rotated and side bent left L2 flexed rotated and side bent right Sacrum right on right       Assessment and Plan:    Nonallopathic problems  Decision today to treat with OMT was based on Physical Exam  After verbal consent patient was treated with HVLA,  ME, FPR techniques in cervical, rib, thoracic, lumbar, and sacral  areas  Patient tolerated the procedure well with improvement in symptoms  Patient given exercises, stretches and lifestyle modifications  See medications in patient instructions if given  Patient will follow up in 4-8 weeks      The above documentation has been reviewed and is accurate and complete Jacqualin Combes       Note: This dictation was prepared with  Dragon dictation along with smaller phrase technology. Any transcriptional errors that result from this process are unintentional.

## 2020-11-14 ENCOUNTER — Other Ambulatory Visit: Payer: Self-pay | Admitting: Internal Medicine

## 2020-11-15 DIAGNOSIS — K648 Other hemorrhoids: Secondary | ICD-10-CM | POA: Insufficient documentation

## 2020-11-21 ENCOUNTER — Ambulatory Visit: Payer: 59 | Admitting: Family Medicine

## 2020-11-22 ENCOUNTER — Other Ambulatory Visit: Payer: Self-pay

## 2020-11-22 ENCOUNTER — Encounter: Payer: Self-pay | Admitting: Internal Medicine

## 2020-11-22 ENCOUNTER — Ambulatory Visit (INDEPENDENT_AMBULATORY_CARE_PROVIDER_SITE_OTHER): Payer: 59 | Admitting: Internal Medicine

## 2020-11-22 DIAGNOSIS — F988 Other specified behavioral and emotional disorders with onset usually occurring in childhood and adolescence: Secondary | ICD-10-CM | POA: Diagnosis not present

## 2020-11-22 DIAGNOSIS — Z720 Tobacco use: Secondary | ICD-10-CM

## 2020-11-22 DIAGNOSIS — F419 Anxiety disorder, unspecified: Secondary | ICD-10-CM | POA: Diagnosis not present

## 2020-11-22 DIAGNOSIS — G8929 Other chronic pain: Secondary | ICD-10-CM

## 2020-11-22 DIAGNOSIS — M545 Low back pain, unspecified: Secondary | ICD-10-CM | POA: Diagnosis not present

## 2020-11-22 MED ORDER — AMPHETAMINE-DEXTROAMPHET ER 30 MG PO CP24: 30.0000 mg | ORAL_CAPSULE | ORAL | 0 refills | Status: DC

## 2020-11-22 MED ORDER — AMPHETAMINE-DEXTROAMPHET ER 30 MG PO CP24: 30.0000 mg | ORAL_CAPSULE | Freq: Every day | ORAL | 0 refills | Status: DC

## 2020-11-22 NOTE — Assessment & Plan Note (Signed)
Lorazepam prn rare Seeing a counselor

## 2020-11-22 NOTE — Assessment & Plan Note (Signed)
Continue w/Adderall Potential benefits of a long term opioids use as well as potential risks (i.e. addiction risk, apnea etc) and complications (i.e. Somnolence, constipation and others) were explained to the patient and were aknowledged.

## 2020-11-22 NOTE — Assessment & Plan Note (Signed)
On exercises, stretching

## 2020-11-22 NOTE — Assessment & Plan Note (Signed)
Trying to quit smoking. 

## 2020-11-22 NOTE — Progress Notes (Signed)
Subjective:  Patient ID: Shelly Green, female    DOB: 1960-11-02  Age: 60 y.o. MRN: [id number]  CC: Follow-up   HPI DARLA MCDONALD presents for ADD, LBP, anxiety  Outpatient Medications Prior to Visit  Medication Sig Dispense Refill   busPIRone (BUSPAR) 30 MG tablet TAKE 1 TABLET BY MOUTH 2 TIMES DAILY. 180 tablet 1   butalbital-acetaminophen-caffeine (FIORICET) 50-325-40 MG tablet TAKE 2 TABLETS BY MOUTH EVERY 6 (SIX) HOURS AS NEEDED. 40 tablet 1   Cholecalciferol (VITAMIN D3) 50 MCG (2000 UT) capsule Take 1 capsule (2,000 Units total) by mouth daily. 100 capsule 3   cloNIDine (CATAPRES) 0.1 MG tablet TAKE 1 TABLET BY MOUTH AT BEDTIME. 90 tablet 1   Fluticasone-Umeclidin-Vilant (TRELEGY ELLIPTA) 100-62.5-25 MCG/INH AEPB Inhale 1 puff into the lungs daily. 1 each 6   gabapentin (NEURONTIN) 100 MG capsule Take by mouth. Take 1 by mouth every day     letrozole (FEMARA) 2.5 MG tablet Take 2.5 mg by mouth daily.     Multiple Vitamin (MULTIVITAMIN WITH MINERALS) TABS tablet Take 1 tablet by mouth 2 (two) times daily.     omeprazole (PRILOSEC) 40 MG capsule Take 1 tablet by mouth daily. Take 1 by mouth every day     propranolol ER (INDERAL LA) 80 MG 24 hr capsule TAKE 1 CAPSULE BY MOUTH EVERY DAY 90 capsule 1   SUMAtriptan (IMITREX) 100 MG tablet TAKE 1 TAB BY MOUTH DAILY AS NEEDED FOR MIGRAINE MAY REPEAT IN 2HOURS IF HEADACHE PERSISTS OR RECURS 12 tablet 3   amphetamine-dextroamphetamine (ADDERALL XR) 30 MG 24 hr capsule Take 1 capsule (30 mg total) by mouth every morning. 30 capsule 0   No facility-administered medications prior to visit.    ROS: Review of Systems  Constitutional:  Negative for activity change, appetite change, chills, fatigue and unexpected weight change.  HENT:  Negative for congestion, mouth sores and sinus pressure.   Eyes:  Negative for visual disturbance.  Respiratory:  Negative for cough and chest tightness.   Gastrointestinal:  Negative for abdominal pain  and nausea.  Genitourinary:  Negative for difficulty urinating, frequency and vaginal pain.  Musculoskeletal:  Negative for back pain and gait problem.  Skin:  Negative for pallor and rash.  Neurological:  Negative for dizziness, tremors, weakness, numbness and headaches.  Psychiatric/Behavioral:  Positive for decreased concentration. Negative for confusion and sleep disturbance. The patient is nervous/anxious.    Objective:  BP 132/76 (BP Location: Right Arm, Patient Position: Sitting, Cuff Size: Normal)   Pulse 65   Temp 98.9 F (37.2 C) (Oral)   Ht 5\' 2"  (1.575 m)   Wt 123 lb (55.8 kg)   SpO2 95%   BMI 22.50 kg/m   BP Readings from Last 3 Encounters:  11/22/20 132/76  09/23/20 (!) 156/99  08/16/20 121/82    Wt Readings from Last 3 Encounters:  11/22/20 123 lb (55.8 kg)  09/23/20 122 lb (55.3 kg)  09/19/20 122 lb (55.3 kg)    Physical Exam Constitutional:      General: She is not in acute distress.    Appearance: She is well-developed.  HENT:     Head: Normocephalic.     Right Ear: External ear normal.     Left Ear: External ear normal.     Nose: Nose normal.  Eyes:     General:        Right eye: No discharge.        Left eye: No discharge.  Conjunctiva/sclera: Conjunctivae normal.     Pupils: Pupils are equal, round, and reactive to light.  Neck:     Thyroid: No thyromegaly.     Vascular: No JVD.     Trachea: No tracheal deviation.  Cardiovascular:     Rate and Rhythm: Normal rate and regular rhythm.     Heart sounds: Normal heart sounds.  Pulmonary:     Effort: No respiratory distress.     Breath sounds: No stridor. No wheezing.  Abdominal:     General: Bowel sounds are normal. There is no distension.     Palpations: Abdomen is soft. There is no mass.     Tenderness: no abdominal tenderness There is no guarding or rebound.  Musculoskeletal:        General: No tenderness.     Cervical back: Normal range of motion and neck supple. No rigidity.   Lymphadenopathy:     Cervical: No cervical adenopathy.  Skin:    Findings: No erythema or rash.  Neurological:     Mental Status: She is oriented to person, place, and time.     Cranial Nerves: No cranial nerve deficit.     Motor: No abnormal muscle tone.     Coordination: Coordination normal.     Deep Tendon Reflexes: Reflexes normal.  Psychiatric:        Behavior: Behavior normal.        Thought Content: Thought content normal.        Judgment: Judgment normal.    Lab Results  Component Value Date   WBC 14.4 (H) 05/15/2020   HGB 16.1 (H) 05/15/2020   HCT 47.9 (H) 05/15/2020   PLT 210.0 05/15/2020   GLUCOSE 87 05/15/2020   CHOL 145 12/29/2018   TRIG 62.0 12/29/2018   HDL 47.70 12/29/2018   LDLCALC 85 12/29/2018   ALT 14 05/15/2020   AST 16 05/15/2020   NA 146 (H) 05/15/2020   K 3.9 05/15/2020   CL 107 05/15/2020   CREATININE 0.87 05/15/2020   BUN 19 05/15/2020   CO2 28 05/15/2020   TSH 2.64 12/29/2018   INR 1.02 09/28/2014    No results found.  Assessment & Plan:   Problem List Items Addressed This Visit     ADD (attention deficit disorder)    Continue w/Adderall Potential benefits of a long term opioids use as well as potential risks (i.e. addiction risk, apnea etc) and complications (i.e. Somnolence, constipation and others) were explained to the patient and were aknowledged.      Anxiety disorder    Lorazepam prn rare Seeing a counselor       Chronic low back pain    On exercises, stretching      Tobacco abuse    Trying to quit smoking         Meds ordered this encounter  Medications   amphetamine-dextroamphetamine (ADDERALL XR) 30 MG 24 hr capsule    Sig: Take 1 capsule (30 mg total) by mouth every morning.    Dispense:  30 capsule    Refill:  0    Please fill on or after 12/01/20   amphetamine-dextroamphetamine (ADDERALL XR) 30 MG 24 hr capsule    Sig: Take 1 capsule (30 mg total) by mouth daily.    Dispense:  30 capsule    Refill:   0    Please fill on or after 12/31/20   amphetamine-dextroamphetamine (ADDERALL XR) 30 MG 24 hr capsule    Sig: Take 1 capsule (30 mg  total) by mouth every morning.    Dispense:  30 capsule    Refill:  0    Please fill on or after 01/30/21      Follow-up: Return in about 3 months (around 02/22/2021) for a follow-up visit.  Walker Kehr, MD

## 2020-12-07 ENCOUNTER — Encounter: Payer: Self-pay | Admitting: Internal Medicine

## 2020-12-07 ENCOUNTER — Other Ambulatory Visit: Payer: Self-pay

## 2020-12-07 ENCOUNTER — Ambulatory Visit: Payer: 59 | Admitting: Internal Medicine

## 2020-12-07 DIAGNOSIS — F988 Other specified behavioral and emotional disorders with onset usually occurring in childhood and adolescence: Secondary | ICD-10-CM | POA: Diagnosis not present

## 2020-12-07 DIAGNOSIS — G43109 Migraine with aura, not intractable, without status migrainosus: Secondary | ICD-10-CM | POA: Diagnosis not present

## 2020-12-07 DIAGNOSIS — J441 Chronic obstructive pulmonary disease with (acute) exacerbation: Secondary | ICD-10-CM

## 2020-12-07 MED ORDER — AZITHROMYCIN 250 MG PO TABS: ORAL_TABLET | ORAL | 0 refills | Status: DC

## 2020-12-07 MED ORDER — TRELEGY ELLIPTA 100-62.5-25 MCG/ACT IN AEPB: 1.0000 | INHALATION_SPRAY | Freq: Every day | RESPIRATORY_TRACT | 3 refills | Status: DC

## 2020-12-07 MED ORDER — METHYLPREDNISOLONE 4 MG PO TBPK: ORAL_TABLET | ORAL | 0 refills | Status: DC

## 2020-12-07 NOTE — Progress Notes (Signed)
Subjective:  Patient ID: Shelly Green, female    DOB: 1960-09-16  Age: 60 y.o. MRN: [id number]  CC: Cough (Pt states she had the flu week or so ago. Still have cough and sinus headaches)   HPI Shelly Green presents for cough Pt had COVID-19 2 mo ago, flu 2 wks ago Cough is dry Follow-up on ADD, headaches  Outpatient Medications Prior to Visit  Medication Sig Dispense Refill   amphetamine-dextroamphetamine (ADDERALL XR) 30 MG 24 hr capsule Take 1 capsule (30 mg total) by mouth every morning. 30 capsule 0   busPIRone (BUSPAR) 30 MG tablet TAKE 1 TABLET BY MOUTH 2 TIMES DAILY. 180 tablet 1   butalbital-acetaminophen-caffeine (FIORICET) 50-325-40 MG tablet TAKE 2 TABLETS BY MOUTH EVERY 6 (SIX) HOURS AS NEEDED. 40 tablet 1   Cholecalciferol (VITAMIN D3) 50 MCG (2000 UT) capsule Take 1 capsule (2,000 Units total) by mouth daily. 100 capsule 3   cloNIDine (CATAPRES) 0.1 MG tablet TAKE 1 TABLET BY MOUTH AT BEDTIME. 90 tablet 1   gabapentin (NEURONTIN) 100 MG capsule Take by mouth. Take 1 by mouth every day     letrozole (FEMARA) 2.5 MG tablet Take 2.5 mg by mouth daily.     Multiple Vitamin (MULTIVITAMIN WITH MINERALS) TABS tablet Take 1 tablet by mouth 2 (two) times daily.     omeprazole (PRILOSEC) 40 MG capsule Take 1 tablet by mouth daily. Take 1 by mouth every day     amphetamine-dextroamphetamine (ADDERALL XR) 30 MG 24 hr capsule Take 1 capsule (30 mg total) by mouth daily. 30 capsule 0   amphetamine-dextroamphetamine (ADDERALL XR) 30 MG 24 hr capsule Take 1 capsule (30 mg total) by mouth every morning. 30 capsule 0   Fluticasone-Umeclidin-Vilant (TRELEGY ELLIPTA) 100-62.5-25 MCG/INH AEPB Inhale 1 puff into the lungs daily. 1 each 6   propranolol ER (INDERAL LA) 80 MG 24 hr capsule TAKE 1 CAPSULE BY MOUTH EVERY DAY 90 capsule 1   SUMAtriptan (IMITREX) 100 MG tablet TAKE 1 TAB BY MOUTH DAILY AS NEEDED FOR MIGRAINE MAY REPEAT IN 2HOURS IF HEADACHE PERSISTS OR RECURS 12 tablet 3   No  facility-administered medications prior to visit.    ROS: Review of Systems  Constitutional:  Negative for activity change, appetite change, chills, fatigue and unexpected weight change.  HENT:  Positive for congestion. Negative for mouth sores and sinus pressure.   Eyes:  Negative for visual disturbance.  Respiratory:  Positive for cough and wheezing. Negative for chest tightness and shortness of breath.   Gastrointestinal:  Negative for abdominal pain and nausea.  Genitourinary:  Negative for difficulty urinating, frequency and vaginal pain.  Musculoskeletal:  Negative for back pain and gait problem.  Skin:  Negative for pallor and rash.  Neurological:  Negative for dizziness, tremors, weakness, numbness and headaches.  Psychiatric/Behavioral:  Negative for confusion and sleep disturbance.    Objective:  BP (!) 142/90 (BP Location: Left Arm)   Pulse 65   Temp 98.4 F (36.9 C) (Oral)   Wt 121 lb 3.2 oz (55 kg)   SpO2 95%   BMI 22.17 kg/m   BP Readings from Last 3 Encounters:  12/25/20 126/86  12/13/20 (!) 160/90  12/07/20 (!) 142/90    Wt Readings from Last 3 Encounters:  12/25/20 122 lb (55.3 kg)  12/13/20 125 lb 9.6 oz (57 kg)  12/07/20 121 lb 3.2 oz (55 kg)    Physical Exam Constitutional:      General: She is not in acute  distress.    Appearance: Normal appearance. She is well-developed.  HENT:     Head: Normocephalic.     Right Ear: External ear normal.     Left Ear: External ear normal.     Nose: Nose normal.  Eyes:     General:        Right eye: No discharge.        Left eye: No discharge.     Conjunctiva/sclera: Conjunctivae normal.     Pupils: Pupils are equal, round, and reactive to light.  Neck:     Thyroid: No thyromegaly.     Vascular: No JVD.     Trachea: No tracheal deviation.  Cardiovascular:     Rate and Rhythm: Normal rate and regular rhythm.     Heart sounds: Normal heart sounds.  Pulmonary:     Effort: No respiratory distress.      Breath sounds: No stridor. No wheezing.  Abdominal:     General: Bowel sounds are normal. There is no distension.     Palpations: Abdomen is soft. There is no mass.     Tenderness: There is no abdominal tenderness. There is no guarding or rebound.  Musculoskeletal:        General: No tenderness.     Cervical back: Normal range of motion and neck supple. No rigidity.  Lymphadenopathy:     Cervical: No cervical adenopathy.  Skin:    Findings: No erythema or rash.  Neurological:     Cranial Nerves: No cranial nerve deficit.     Motor: No abnormal muscle tone.     Coordination: Coordination normal.     Deep Tendon Reflexes: Reflexes normal.  Psychiatric:        Behavior: Behavior normal.        Thought Content: Thought content normal.        Judgment: Judgment normal.    Lab Results  Component Value Date   WBC 14.4 (H) 05/15/2020   HGB 16.1 (H) 05/15/2020   HCT 47.9 (H) 05/15/2020   PLT 210.0 05/15/2020   GLUCOSE 87 05/15/2020   CHOL 145 12/29/2018   TRIG 62.0 12/29/2018   HDL 47.70 12/29/2018   LDLCALC 85 12/29/2018   ALT 14 05/15/2020   AST 16 05/15/2020   NA 146 (H) 05/15/2020   K 3.9 05/15/2020   CL 107 05/15/2020   CREATININE 0.87 05/15/2020   BUN 19 05/15/2020   CO2 28 05/15/2020   TSH 2.64 12/29/2018   INR 1.02 09/28/2014    No results found.  Assessment & Plan:   Problem List Items Addressed This Visit     ADD (attention deficit disorder)    Stable.  Continue w/Adderall Potential benefits of a long term opioids use as well as potential risks (i.e. addiction risk, apnea etc) and complications (i.e. Somnolence, constipation and others) were explained to the patient and were aknowledged.      COPD exacerbation ([place])    New Pt had COVID-19 2 mo ago, flu 2 wks ago Reduce smoking. UseTrelegy qd Medrol pack. Zpack       Relevant Medications   Fluticasone-Umeclidin-Vilant (TRELEGY ELLIPTA) 100-62.5-25 MCG/ACT AEPB   azithromycin (ZITHROMAX Z-PAK) 250 MG  tablet   methylPREDNISolone (MEDROL DOSEPAK) 4 MG TBPK tablet   Migraine headache with aura    Stable Imitrex, Zofran and diclofenac prn Fioricet prn severe HA         Meds ordered this encounter  Medications   DISCONTD: methylPREDNISolone (MEDROL DOSEPAK) 4 MG TBPK  tablet    Sig: As directed    Dispense:  21 tablet    Refill:  0   DISCONTD: azithromycin (ZITHROMAX Z-PAK) 250 MG tablet    Sig: As directed    Dispense:  6 tablet    Refill:  0   DISCONTD: Fluticasone-Umeclidin-Vilant (TRELEGY ELLIPTA) 100-62.5-25 MCG/ACT AEPB    Sig: Inhale 1 puff into the lungs daily.    Dispense:  3 each    Refill:  3   DISCONTD: methylPREDNISolone (MEDROL DOSEPAK) 4 MG TBPK tablet    Sig: As directed    Dispense:  21 tablet    Refill:  0   Fluticasone-Umeclidin-Vilant (TRELEGY ELLIPTA) 100-62.5-25 MCG/ACT AEPB    Sig: Inhale 1 puff into the lungs daily.    Dispense:  3 each    Refill:  3   DISCONTD: azithromycin (ZITHROMAX Z-PAK) 250 MG tablet    Sig: As directed    Dispense:  6 tablet    Refill:  0   azithromycin (ZITHROMAX Z-PAK) 250 MG tablet    Sig: As directed    Dispense:  6 tablet    Refill:  0   methylPREDNISolone (MEDROL DOSEPAK) 4 MG TBPK tablet    Sig: As directed    Dispense:  21 tablet    Refill:  0      Follow-up: No follow-ups on file.  Walker Kehr, MD

## 2020-12-07 NOTE — Assessment & Plan Note (Signed)
New Pt had COVID-19 2 mo ago, flu 2 wks ago Reduce smoking. UseTrelegy qd Medrol pack. Zpack

## 2020-12-11 ENCOUNTER — Other Ambulatory Visit: Payer: Self-pay | Admitting: Neurology

## 2020-12-12 NOTE — Progress Notes (Signed)
NEUROLOGY FOLLOW UP OFFICE NOTE  Shelly Green [id number]  Assessment/Plan:   Migraine without aura, without status migrainosus, not intractable Elevated blood pressure  Migraine prevention:  Stop propranolol.  Start topiramate 25mg  at bedtime.  If no improvement in 4 weeks, increase to 50mg  at bedtime Migraine rescue:  Stop sumatriptan.  Start rizatriptan 10mg  Limit use of pain relievers to no more than 2 days out of week to prevent risk of rebound or medication-overuse headache. Keep headache diary Follow up with PCP regarding blood pressure Follow up with me   Subjective:  Shelly Green is a 60 year old right-handed female with migraines, ADD, anxiety, arthritis, IBS, migraines and history of breast cancer in remission who follows up for migraines.  UPDATE: Started propranolol in May, which was increased from 60mg  to 80mg  in June.  Headaches improved for a time, however she had COVID and recently had the flu, so she has been having increased migraines.  . Intensity:  moderate Duration:  4 days Frequency:  2 a month Rescue protocol:  Tylenol first line, sumatriptan second line.  Will take Fioricet if onset is severe and quick. Current NSAIDS/analgesics:  ASA Current triptans:  Sumatriptan 100mg  Current ergotamine:  none Current anti-emetic:  none Current muscle relaxants:  none Current Antihypertensive medications:  propranolol ER 80mg  daily Current Antidepressant medications:  none Current Anticonvulsant medications:  Gabapentin (for hot flashes) Current anti-CGRP:  none Current Vitamins/Herbal/Supplements:  MVI, D Current Antihistamines/Decongestants:  none Other therapy:  OMM Hormone/birth control:  Femara Other medications:  Adderall XR, BuSpar, lorazepam  Caffeine:  No coffee.  Maybe 1 small coca cola daily Diet:  Drinks little water. Exercise:  yes Depression:  yes; Anxiety:  none Other pain:  Chronic low back pain Sleep:  Poor.  Hot flashes at night.   Takes gabapentin  HISTORY:  Started having headaches as a child.  They were moderate to severe bifrontal pressupe to throbbing.  They are associated with nausea, photophobia and phonophobia.  If takes something early and lay down, they will last within an hour, otherwise all day.  They occur at least 2 to 3 days a week.  Triggers include change in weather and seasonal allergies.  Relieving factors include rest.    Increased after starting Wellbutrin.  Severe pain in jaw and back of head.  Neck pain.  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  Associated with nausea, vomiting, photophobia and phonophobia.  They were daily.  Stopped Wellbutrin after 5 days.  Since stopping Wellbutrin, headaches decreased to once a week and now back to baseline.      Past NSAIDS/analgesics:  Diclofenac, tramadol, ibuprofen, Fioricet Past abortive triptans:  none Past abortive ergotamine:  none Past muscle relaxants:  none Past anti-emetic:  Zofran, Compazine Past antihypertensive medications:  none Past antidepressant medications:  Wellbutrin (aggravated headaches), paroxetine, Trintellix Past anticonvulsant medications:  lamotrigine Past anti-CGRP:  none Past vitamins/Herbal/Supplements:  none Past antihistamines/decongestants:  Benadryl, Flonase, Claritin Other past therapies:  none    Family history of headache:  Maternal grandmother, mother, sisters  PAST MEDICAL HISTORY: Past Medical History:  Diagnosis Date   Arthritis    Cancer ([place])    breast    History of colon polyps    IBS (irritable bowel syndrome)    Menopausal disorder    Migraines    Seasonal allergies    Social anxiety disorder     MEDICATIONS: Current Outpatient  Medications on File Prior to Visit  Medication Sig Dispense Refill   amphetamine-dextroamphetamine (ADDERALL XR) 30 MG 24 hr capsule Take 1 capsule (30 mg total) by mouth every morning.  30 capsule 0   amphetamine-dextroamphetamine (ADDERALL XR) 30 MG 24 hr capsule Take 1 capsule (30 mg total) by mouth daily. 30 capsule 0   amphetamine-dextroamphetamine (ADDERALL XR) 30 MG 24 hr capsule Take 1 capsule (30 mg total) by mouth every morning. 30 capsule 0   azithromycin (ZITHROMAX Z-PAK) 250 MG tablet As directed 6 tablet 0   busPIRone (BUSPAR) 30 MG tablet TAKE 1 TABLET BY MOUTH 2 TIMES DAILY. 180 tablet 1   butalbital-acetaminophen-caffeine (FIORICET) 50-325-40 MG tablet TAKE 2 TABLETS BY MOUTH EVERY 6 (SIX) HOURS AS NEEDED. 40 tablet 1   Cholecalciferol (VITAMIN D3) 50 MCG (2000 UT) capsule Take 1 capsule (2,000 Units total) by mouth daily. 100 capsule 3   cloNIDine (CATAPRES) 0.1 MG tablet TAKE 1 TABLET BY MOUTH AT BEDTIME. 90 tablet 1   Fluticasone-Umeclidin-Vilant (TRELEGY ELLIPTA) 100-62.5-25 MCG/ACT AEPB Inhale 1 puff into the lungs daily. 3 each 3   gabapentin (NEURONTIN) 100 MG capsule Take by mouth. Take 1 by mouth every day     letrozole (FEMARA) 2.5 MG tablet Take 2.5 mg by mouth daily.     methylPREDNISolone (MEDROL DOSEPAK) 4 MG TBPK tablet As directed 21 tablet 0   Multiple Vitamin (MULTIVITAMIN WITH MINERALS) TABS tablet Take 1 tablet by mouth 2 (two) times daily.     omeprazole (PRILOSEC) 40 MG capsule Take 1 tablet by mouth daily. Take 1 by mouth every day     propranolol ER (INDERAL LA) 80 MG 24 hr capsule TAKE 1 CAPSULE BY MOUTH EVERY DAY 90 capsule 1   SUMAtriptan (IMITREX) 100 MG tablet TAKE 1 TAB BY MOUTH DAILY AS NEEDED FOR MIGRAINE MAY REPEAT IN 2HOURS IF HEADACHE PERSISTS OR RECURS 12 tablet 3   No current facility-administered medications on file prior to visit.    ALLERGIES: Allergies  Allergen Reactions   Codeine Anaphylaxis   Hydrocodone Shortness Of Breath   Penicillins Anaphylaxis    As a child Has patient had a PCN reaction causing immediate rash, facial/tongue/throat swelling, SOB or lightheadedness with hypotension: yes Has patient had a  PCN reaction causing severe rash involving mucus membranes or skin necrosis: unknown Has patient had a PCN reaction that required hospitalization: yes Has patient had a PCN reaction occurring within the last 10 years: no If all of the above answers are "NO", then may proceed with Cephalosporin use.    Sulfa Antibiotics Anaphylaxis and Rash   Tramadol Shortness Of Breath   Clindamycin/Lincomycin     rush   Wellbutrin [Bupropion]     HAs    FAMILY HISTORY: Family History  Problem Relation Age of Onset   Arthritis Mother    Lymphoma Mother    Colon cancer Sister 64   Mental illness Daughter    Mental illness Maternal Aunt    Arthritis Maternal Grandmother    Hypertension Maternal Grandfather       Objective:  Blood pressure (!) 160/90, pulse (!) 55, height 5\' 2"  (1.575 m), weight 125 lb 9.6 oz (57 kg), SpO2 97 %. General: No acute distress.  Patient appears well-groomed.     Metta Clines, DO  CC: Lew Dawes, MD

## 2020-12-13 ENCOUNTER — Ambulatory Visit: Payer: 59 | Admitting: Neurology

## 2020-12-13 ENCOUNTER — Other Ambulatory Visit: Payer: Self-pay

## 2020-12-13 ENCOUNTER — Encounter: Payer: Self-pay | Admitting: Neurology

## 2020-12-13 VITALS — BP 160/90 | HR 55 | Ht 62.0 in | Wt 125.6 lb

## 2020-12-13 DIAGNOSIS — G43009 Migraine without aura, not intractable, without status migrainosus: Secondary | ICD-10-CM

## 2020-12-13 DIAGNOSIS — R030 Elevated blood-pressure reading, without diagnosis of hypertension: Secondary | ICD-10-CM | POA: Diagnosis not present

## 2020-12-13 MED ORDER — TOPIRAMATE 25 MG PO TABS: 25.0000 mg | ORAL_TABLET | Freq: Every day | ORAL | 5 refills | Status: DC

## 2020-12-13 MED ORDER — RIZATRIPTAN BENZOATE 10 MG PO TBDP: ORAL_TABLET | ORAL | 5 refills | Status: DC

## 2020-12-13 NOTE — Patient Instructions (Signed)
Stop propranolol.  Start topiramate 25mg  at bedtime.  If no significant improvement in 4 weeks, contact me Stop sumatriptan.  Take rizatriptan 10mg  earliest onset of migraine.  May repeat in 2 hours.  Maximum 2 tablets in 24 hours Limit use of pain relievers to no more than 2 days out of week to prevent risk of rebound or medication-overuse headache. Keep headache diary Follow up 6 months.

## 2020-12-22 NOTE — Progress Notes (Signed)
I, Pincus Badder, am serving as a Education administrator for Doctor Devota [facility] [place] Phone: [phone] Subjective:   I, Pincus Badder, am serving as a Education administrator for Doctor [name]   I'm seeing this patient by the request  of:  Plotnikov, Evie Lacks, MD  CC: Neck and back pain follow-up  HYI:[id number]  Shelly Green is a 60 y.o. female coming in with complaint of back and neck pain. OMT 08/23/2020. Patient states neck has been better since neurologist changed her med's hasn't had a migraine since, here for maintenance. Stopped cancer medications  11/21/20 has appointment with cancer office 01/11/2021 back feels better hasn't been physical  Medications patient has been prescribed: None  Taking:  Patient is continuing to be followed for her breast cancer.  He does have some underlying depression as well patient is working on.       Reviewed prior external information including notes and imaging from previsou exam, outside providers and external EMR if available.   As well as notes that were available from care everywhere and other healthcare systems.  This includes patient's most recent follow-up with for a COPD exacerbation by primary care as well as the hematology being followed up for the depression.  Patient is also seeing neurology for the migraines recently.  Past medical history, social, surgical and family history all reviewed in electronic medical record.  No pertanent information unless stated regarding to the chief complaint.   Past Medical History:  Diagnosis Date   Arthritis    Cancer ([place])    breast    History of colon polyps    IBS (irritable bowel syndrome)    Menopausal disorder    Migraines    Seasonal allergies    Social anxiety disorder     Allergies  Allergen Reactions   Codeine Anaphylaxis   Hydrocodone Shortness Of Breath   Penicillins Anaphylaxis    As a child Has patient had a  PCN reaction causing immediate rash, facial/tongue/throat swelling, SOB or lightheadedness with hypotension: yes Has patient had a PCN reaction causing severe rash involving mucus membranes or skin necrosis: unknown Has patient had a PCN reaction that required hospitalization: yes Has patient had a PCN reaction occurring within the last 10 years: no If all of the above answers are "NO", then may proceed with Cephalosporin use.    Sulfa Antibiotics Anaphylaxis and Rash   Tramadol Shortness Of Breath   Clindamycin/Lincomycin     rush   Wellbutrin [Bupropion]     HAs     Review of Systems:  No headache, visual changes, nausea, vomiting, diarrhea, constipation, dizziness, abdominal pain, skin rash, fevers, chills, night sweats, weight loss, swollen lymph nodes, joint swelling, chest pain, shortness of breath, mood changes. POSITIVE muscle aches, body aches  Objective  Blood pressure 126/86, pulse (!) 56, height 5\' 2"  (1.575 m), weight 122 lb (55.3 kg), SpO2 99 %.   General: No apparent distress alert and oriented x3 mood and affect normal, dressed appropriately.  HEENT: Pupils equal, extraocular movements intact  Respiratory: Patient's speak in full sentences and does not appear short of breath  Cardiovascular: No lower extremity edema, non tender, no erythema  Patient's neck exam does have some mild loss of lordosis.  Some tenderness to palpation of the paraspinal musculature.  Patient's low back does have significant tightness noted.  Tightness with straight leg test but no true radicular symptoms.  Osteopathic findings  C2 flexed rotated and side bent right C6 flexed rotated and side bent left T3 extended rotated and side bent right inhaled rib T9 extended rotated and side bent left L2 flexed rotated and side bent right Sacrum right on right       Assessment and Plan:  Chronic low back pain Chronic low back pain. Patient has been doing relatively well with the osteopathic  manipulation.  Has been sometime since we have seen patient secondary to her other elements.  Encourage patient to continue to follow-up with her other primary care provider as well as her oncologist.  Discussed with the possibility of further imaging which patient declined at this time.  Patient is discontinuing some of her medications for her cancer at this time and encouraged her to discuss this with her oncologist.  Follow-up with me again in 6 weeks   Nonallopathic problems  Decision today to treat with OMT was based on Physical Exam  After verbal consent patient was treated with HVLA, ME, FPR techniques in cervical, rib, thoracic, lumbar, and sacral  areas  Patient tolerated the procedure well with improvement in symptoms  Patient given exercises, stretches and lifestyle modifications  See medications in patient instructions if given  Patient will follow up in 4-8 weeks      The above documentation has been reviewed and is accurate and complete Lyndal Pulley, DO       Note: This dictation was prepared with Dragon dictation along with smaller phrase technology. Any transcriptional errors that result from this process are unintentional.

## 2020-12-25 ENCOUNTER — Encounter: Payer: Self-pay | Admitting: Family Medicine

## 2020-12-25 ENCOUNTER — Other Ambulatory Visit: Payer: Self-pay

## 2020-12-25 ENCOUNTER — Encounter: Payer: Self-pay | Admitting: Internal Medicine

## 2020-12-25 ENCOUNTER — Ambulatory Visit: Payer: 59 | Admitting: Family Medicine

## 2020-12-25 VITALS — BP 126/86 | HR 56 | Ht 62.0 in | Wt 122.0 lb

## 2020-12-25 DIAGNOSIS — M9908 Segmental and somatic dysfunction of rib cage: Secondary | ICD-10-CM

## 2020-12-25 DIAGNOSIS — M545 Low back pain, unspecified: Secondary | ICD-10-CM

## 2020-12-25 DIAGNOSIS — M9902 Segmental and somatic dysfunction of thoracic region: Secondary | ICD-10-CM | POA: Diagnosis not present

## 2020-12-25 DIAGNOSIS — M9903 Segmental and somatic dysfunction of lumbar region: Secondary | ICD-10-CM | POA: Diagnosis not present

## 2020-12-25 DIAGNOSIS — M9901 Segmental and somatic dysfunction of cervical region: Secondary | ICD-10-CM

## 2020-12-25 DIAGNOSIS — G8929 Other chronic pain: Secondary | ICD-10-CM

## 2020-12-25 DIAGNOSIS — M9904 Segmental and somatic dysfunction of sacral region: Secondary | ICD-10-CM | POA: Diagnosis not present

## 2020-12-25 NOTE — Assessment & Plan Note (Signed)
Stable.  Continue w/Adderall Potential benefits of a long term opioids use as well as potential risks (i.e. addiction risk, apnea etc) and complications (i.e. Somnolence, constipation and others) were explained to the patient and were aknowledged.

## 2020-12-25 NOTE — Patient Instructions (Addendum)
Great to see you  Know that I am here for you Follow up with your other doctors  See you in 2 months

## 2020-12-25 NOTE — Assessment & Plan Note (Signed)
Chronic low back pain. Patient has been doing relatively well with the osteopathic manipulation.  Has been sometime since we have seen patient secondary to her other elements.  Encourage patient to continue to follow-up with her other primary care provider as well as her oncologist.  Discussed with the possibility of further imaging which patient declined at this time.  Patient is discontinuing some of her medications for her cancer at this time and encouraged her to discuss this with her oncologist.  Follow-up with me again in 6 weeks

## 2020-12-25 NOTE — Assessment & Plan Note (Signed)
Stable Imitrex, Zofran and diclofenac prn Fioricet prn severe HA

## 2021-01-01 ENCOUNTER — Other Ambulatory Visit: Payer: Self-pay | Admitting: Internal Medicine

## 2021-01-12 DIAGNOSIS — M542 Cervicalgia: Secondary | ICD-10-CM | POA: Insufficient documentation

## 2021-01-12 DIAGNOSIS — M810 Age-related osteoporosis without current pathological fracture: Secondary | ICD-10-CM | POA: Insufficient documentation

## 2021-02-21 NOTE — Progress Notes (Signed)
[facility] [place] Phone: [phone] Subjective:   Shelly [name], am serving as a scribe for Dr. Hulan Saas.  This visit occurred during the SARS-CoV-2 public health emergency.  Safety protocols were in place, including screening questions prior to the visit, additional usage of staff PPE, and extensive cleaning of exam room while observing appropriate contact time as indicated for disinfecting solutions.   I'm seeing this patient by the request  of:  Plotnikov, Evie Lacks, MD  CC: Back and neck pain  [id number]  Shelly [name] is a 60 y.o. female coming in with complaint of back and neck pain. OMT 12/25/2020. Patient states that she had a recent brain scan and a PET scan. Wants patient to do back xray as possible fracture. [name] increase in pain since last visit. Has been having headaches that start in back of neck, teeth and jaw. Patient stopped Adderall and headaches went away but are slowly coming back.  Patient is also discontinued her cancer medication and since then has been feeling significantly better with greater clarity and significant less pain.  Patient denies any significant back pain at the moment.  Only really having some mild neck discomfort and headaches.  PET SCAN RESULTS IN CARE EVERYWHERE shows the patient did have an area at the T8 vertebrae that was consistent with a potential compression fracture with recommendation of following up in 3 to 6 months.  Read hematology notes who stated that they would either want to do this or we can get a repeat x-ray today.  Medications patient has been prescribed: None   Taking:  Also reviewed patient's MRI that was unremarkable for the brain.    Reviewed also the patient is being followed for osteoporosis.  Likely going to get approval for another medication.   Reviewed prior external information including notes and imaging from previsou exam, outside providers  and external EMR if available.  As we discussed previously above  As well as notes that were available from care everywhere and other healthcare systems.  Past medical history, social, surgical and family history all reviewed in electronic medical record.  [name] pertanent information unless stated regarding to the chief complaint.   Past Medical History:  Diagnosis Date   Arthritis    Cancer ([place])    breast    History of colon polyps    IBS (irritable bowel syndrome)    Menopausal disorder    Migraines    Seasonal allergies    Social anxiety disorder     Allergies  Allergen Reactions   Codeine Anaphylaxis   Hydrocodone Shortness Of Breath   Penicillins Anaphylaxis    As a child Has patient had a PCN reaction causing immediate rash, facial/tongue/throat swelling, SOB or lightheadedness with hypotension: yes Has patient had a PCN reaction causing severe rash involving mucus membranes or skin necrosis: unknown Has patient had a PCN reaction that required hospitalization: yes Has patient had a PCN reaction occurring within the last 10 years: [name] If all of the above answers are "[name]", then may proceed with Cephalosporin use.    Sulfa Antibiotics Anaphylaxis and Rash   Tramadol Shortness Of Breath   Clindamycin/Lincomycin     rush   Wellbutrin [Bupropion]     HAs     Review of Systems:  [name] headache, visual changes, nausea, vomiting, diarrhea, constipation, dizziness, abdominal pain, skin rash, fevers, chills, night sweats, weight loss, swollen lymph nodes, body aches, joint swelling,  chest pain, shortness of breath, mood changes.  Headaches are only very intermittent  Objective  Blood pressure (!) 144/94, pulse 80, height 5\' 2"  (1.575 m), weight 127 lb (57.6 kg), SpO2 98 %.   General: [name] apparent distress alert and oriented x3 mood and affect normal, dressed appropriately.  HEENT: Pupils equal, extraocular movements intact  Respiratory: Patient's speak in full sentences and does  not appear short of breath  Cardiovascular: [name] lower extremity edema, non tender, [name] erythema  Neuro: Cranial nerves II through XII are intact, neurovascularly intact in all extremities with 2+ DTRs and 2+ pulses.  Gait normal with good balance and coordination.  MSK:  Non tender with full range of motion and good stability and symmetric strength and tone of shoulders, elbows, wrist, hip, knee and ankles bilaterally.  Back -arthritic changes.  Patient is having [name] significant tenderness in the thoracic area.  Patient has [name] midline tenderness.  Very mild limited range of motion neurovascularly intact.       Assessment and Plan:         The above documentation has been reviewed and is accurate and complete Shelly Pulley, DO        Note: This dictation was prepared with Dragon dictation along with smaller phrase technology. Any transcriptional errors that result from this process are unintentional.

## 2021-02-22 ENCOUNTER — Ambulatory Visit (INDEPENDENT_AMBULATORY_CARE_PROVIDER_SITE_OTHER): Payer: 59

## 2021-02-22 ENCOUNTER — Other Ambulatory Visit: Payer: Self-pay

## 2021-02-22 ENCOUNTER — Ambulatory Visit: Payer: 59 | Admitting: Family Medicine

## 2021-02-22 ENCOUNTER — Ambulatory Visit: Payer: 59 | Admitting: Internal Medicine

## 2021-02-22 ENCOUNTER — Other Ambulatory Visit: Payer: Self-pay | Admitting: Family Medicine

## 2021-02-22 VITALS — BP 144/94 | HR 80 | Ht 62.0 in | Wt 127.0 lb

## 2021-02-22 DIAGNOSIS — M545 Low back pain, unspecified: Secondary | ICD-10-CM | POA: Diagnosis not present

## 2021-02-22 DIAGNOSIS — G8929 Other chronic pain: Secondary | ICD-10-CM

## 2021-02-22 DIAGNOSIS — M546 Pain in thoracic spine: Secondary | ICD-10-CM | POA: Diagnosis not present

## 2021-02-22 DIAGNOSIS — C50919 Malignant neoplasm of unspecified site of unspecified female breast: Secondary | ICD-10-CM | POA: Diagnosis not present

## 2021-02-22 NOTE — Patient Instructions (Signed)
Looks old Inflammation is much down Consider seeing me in 2-3 months for another xray

## 2021-02-22 NOTE — Assessment & Plan Note (Signed)
Patient is significantly improved since stopping the Femara.  No need for osteopathic manipulation today.  Patient's weight is stable.  No other signs that would be concern for systemic illness at this time

## 2021-02-22 NOTE — Assessment & Plan Note (Signed)
Patient no longer wants to take the medication.  Is following up with hematology oncology.  Did have a bone scan that did show patient did have a little uptake at the T8 area.  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  Patient now has had the same 1 since April.  Discussed with patient at great length about the potential for getting another bone scan versus another x-ray.  Patient would like to consider doing that again in 2 to 3 months for the x-ray first and see.  We also discussed the possibility of an MRI but patient is concerned that with an MRI we could potentially find something else that would be needing further work-up and she would like to avoid that if possible.  Patient understands if any significant change in health she needs to seek medical attention immediately.  Patient is in agreement with the plan.

## 2021-02-23 ENCOUNTER — Encounter: Payer: Self-pay | Admitting: Family Medicine

## 2021-02-26 NOTE — Telephone Encounter (Signed)
Documents were faxed

## 2021-03-02 ENCOUNTER — Other Ambulatory Visit: Payer: Self-pay

## 2021-03-02 ENCOUNTER — Ambulatory Visit: Payer: 59 | Admitting: Internal Medicine

## 2021-03-02 ENCOUNTER — Encounter: Payer: Self-pay | Admitting: Internal Medicine

## 2021-03-02 DIAGNOSIS — G43109 Migraine with aura, not intractable, without status migrainosus: Secondary | ICD-10-CM

## 2021-03-02 DIAGNOSIS — Q782 Osteopetrosis: Secondary | ICD-10-CM

## 2021-03-02 DIAGNOSIS — F988 Other specified behavioral and emotional disorders with onset usually occurring in childhood and adolescence: Secondary | ICD-10-CM

## 2021-03-02 DIAGNOSIS — F419 Anxiety disorder, unspecified: Secondary | ICD-10-CM

## 2021-03-02 DIAGNOSIS — Z720 Tobacco use: Secondary | ICD-10-CM

## 2021-03-02 DIAGNOSIS — C50919 Malignant neoplasm of unspecified site of unspecified female breast: Secondary | ICD-10-CM

## 2021-03-02 MED ORDER — METHYLPHENIDATE HCL 10 MG PO TABS: 10.0000 mg | ORAL_TABLET | Freq: Two times a day (BID) | ORAL | 0 refills | Status: DC

## 2021-03-02 NOTE — Assessment & Plan Note (Signed)
Femara - stopped in 11/22

## 2021-03-02 NOTE — Progress Notes (Signed)
Subjective:  Patient ID: Shelly Green, female    DOB: Aug 01, 1960  Age: 61 y.o. MRN: [id number]  CC: No chief complaint on file.   HPI ROSANNE WOHLFARTH presents for ADD - ran out and stopped Adderall. HAs got better. She is off cancer Rx - feeling better. F/u on osteoporosis - just took Evenity. Smoker - 1 ppd.  HA is better off Femara, Adderall  Outpatient Medications Prior to Visit  Medication Sig Dispense Refill   butalbital-acetaminophen-caffeine (FIORICET) 50-325-40 MG tablet TAKE 2 TABLETS BY MOUTH EVERY 6 (SIX) HOURS AS NEEDED. 40 tablet 1   Cholecalciferol (VITAMIN D3) 50 MCG (2000 UT) capsule Take 1 capsule (2,000 Units total) by mouth daily. 100 capsule 3   rizatriptan (MAXALT-MLT) 10 MG disintegrating tablet Take 1 tablet earliest onset of migraine.  May repeat in 2 hours if needed.  Maximum 2 tablets in 24 hours 10 tablet 5   SUMAtriptan (IMITREX) 100 MG tablet TAKE 1 TAB BY MOUTH DAILY AS NEEDED FOR MIGRAINE MAY REPEAT IN 2HOURS IF HEADACHE PERSISTS OR RECURS 9 tablet 5   No facility-administered medications prior to visit.    ROS: Review of Systems  Constitutional:  Negative for activity change, appetite change, chills, fatigue and unexpected weight change.  HENT:  Negative for congestion, mouth sores and sinus pressure.   Eyes:  Negative for visual disturbance.  Respiratory:  Negative for cough and chest tightness.   Gastrointestinal:  Negative for abdominal pain and nausea.  Genitourinary:  Negative for difficulty urinating, frequency and vaginal pain.  Musculoskeletal:  Negative for back pain and gait problem.  Skin:  Negative for pallor and rash.  Neurological:  Negative for dizziness, tremors, weakness, numbness and headaches.  Psychiatric/Behavioral:  Positive for decreased concentration and dysphoric mood. Negative for confusion, sleep disturbance and suicidal ideas. The patient is not nervous/anxious.    Objective:  BP (!) 144/78 (BP Location: Right Arm,  Patient Position: Sitting, Cuff Size: Large)    Pulse 84    Temp 98.2 F (36.8 C) (Oral)    Ht 5\' 2"  (1.575 m)    Wt 124 lb (56.2 kg)    SpO2 95%    BMI 22.68 kg/m   BP Readings from Last 3 Encounters:  03/02/21 (!) 144/78  02/22/21 (!) 144/94  12/25/20 126/86    Wt Readings from Last 3 Encounters:  03/02/21 124 lb (56.2 kg)  02/22/21 127 lb (57.6 kg)  12/25/20 122 lb (55.3 kg)    Physical Exam Constitutional:      General: She is not in acute distress.    Appearance: She is well-developed.  HENT:     Head: Normocephalic.     Right Ear: External ear normal.     Left Ear: External ear normal.     Nose: Nose normal.  Eyes:     General:        Right eye: No discharge.        Left eye: No discharge.     Conjunctiva/sclera: Conjunctivae normal.     Pupils: Pupils are equal, round, and reactive to light.  Neck:     Thyroid: No thyromegaly.     Vascular: No JVD.     Trachea: No tracheal deviation.  Cardiovascular:     Rate and Rhythm: Normal rate and regular rhythm.     Heart sounds: Normal heart sounds.  Pulmonary:     Effort: No respiratory distress.     Breath sounds: No stridor. No wheezing.  Abdominal:  General: Bowel sounds are normal. There is no distension.     Palpations: Abdomen is soft. There is no mass.     Tenderness: There is no abdominal tenderness. There is no guarding or rebound.  Musculoskeletal:        General: No tenderness.     Cervical back: Normal range of motion and neck supple. No rigidity.  Lymphadenopathy:     Cervical: No cervical adenopathy.  Skin:    Findings: No erythema or rash.  Neurological:     Cranial Nerves: No cranial nerve deficit.     Motor: No abnormal muscle tone.     Coordination: Coordination normal.     Deep Tendon Reflexes: Reflexes normal.  Psychiatric:        Behavior: Behavior normal.        Thought Content: Thought content normal.        Judgment: Judgment normal.    Lab Results  Component Value Date   WBC  14.4 (H) 05/15/2020   HGB 16.1 (H) 05/15/2020   HCT 47.9 (H) 05/15/2020   PLT 210.0 05/15/2020   GLUCOSE 87 05/15/2020   CHOL 145 12/29/2018   TRIG 62.0 12/29/2018   HDL 47.70 12/29/2018   LDLCALC 85 12/29/2018   ALT 14 05/15/2020   AST 16 05/15/2020   NA 146 (H) 05/15/2020   K 3.9 05/15/2020   CL 107 05/15/2020   CREATININE 0.87 05/15/2020   BUN 19 05/15/2020   CO2 28 05/15/2020   TSH 2.64 12/29/2018   INR 1.02 09/28/2014    No results found.  Assessment & Plan:   Problem List Items Addressed This Visit     ADD (attention deficit disorder)    Adderall is n/a Will try Ritalin  Potential benefits of a long term amphetamines  use as well as potential risks  and complications were explained to the patient and were aknowledged.      Anxiety disorder    Better off Femara      Breast cancer ([place])    Femara - stopped in 11/22      Migraine headache with aura    Better off Femara, Adderall      Osteopetrosis     Pt started Evenity      Tobacco abuse    1 ppd - discussed         Meds ordered this encounter  Medications   methylphenidate (RITALIN) 10 MG tablet    Sig: Take 1 tablet (10 mg total) by mouth 2 (two) times daily. Take after breakfast and after lunch    Dispense:  60 tablet    Refill:  0      Follow-up: Return in about 3 months (around 05/30/2021) for a follow-up visit.  Walker Kehr, MD

## 2021-03-02 NOTE — Assessment & Plan Note (Signed)
Pt started [employer]

## 2021-03-02 NOTE — Assessment & Plan Note (Signed)
1 ppd - discussed

## 2021-03-02 NOTE — Assessment & Plan Note (Signed)
Better off Femara

## 2021-03-02 NOTE — Assessment & Plan Note (Signed)
Adderall is n/a Will try Ritalin  Potential benefits of a long term amphetamines  use as well as potential risks  and complications were explained to the patient and were aknowledged.

## 2021-03-02 NOTE — Assessment & Plan Note (Signed)
Better off Femara, Adderall

## 2021-04-12 ENCOUNTER — Other Ambulatory Visit: Payer: Self-pay | Admitting: Neurology

## 2021-04-30 ENCOUNTER — Encounter: Payer: Self-pay | Admitting: Internal Medicine

## 2021-05-01 NOTE — Telephone Encounter (Signed)
Called pt inform MD response.. Made appt for Friday 05/04/21.Marland KitchenJohny Green ?

## 2021-05-04 ENCOUNTER — Encounter: Payer: Self-pay | Admitting: Internal Medicine

## 2021-05-04 ENCOUNTER — Ambulatory Visit: Payer: 59 | Admitting: Internal Medicine

## 2021-05-04 VITALS — BP 148/102 | HR 75 | Temp 98.7°F | Ht 62.0 in | Wt 121.0 lb

## 2021-05-04 DIAGNOSIS — F17200 Nicotine dependence, unspecified, uncomplicated: Secondary | ICD-10-CM | POA: Diagnosis not present

## 2021-05-04 DIAGNOSIS — F339 Major depressive disorder, recurrent, unspecified: Secondary | ICD-10-CM

## 2021-05-04 DIAGNOSIS — Z720 Tobacco use: Secondary | ICD-10-CM

## 2021-05-04 DIAGNOSIS — F988 Other specified behavioral and emotional disorders with onset usually occurring in childhood and adolescence: Secondary | ICD-10-CM | POA: Diagnosis not present

## 2021-05-04 MED ORDER — METHYLPHENIDATE HCL 10 MG PO TABS: ORAL_TABLET | ORAL | 0 refills | Status: DC

## 2021-05-04 NOTE — Assessment & Plan Note (Signed)
Now on wellbutrin XL per Ms [place], [place] w/smokng cessation clinic. ?

## 2021-05-04 NOTE — Progress Notes (Signed)
? ?Subjective:  ?Patient ID: Shelly Green, female    DOB: [birth year]  Age: 61 y.o. MRN: [id number] ? ?CC: No chief complaint on file. ? ? ?HPI ?Shelly Green presents for ADD - worse since she quit smoking. ?Now on wellbutrin XL per Ms [place], [place] w/smokng cessation clinic. ?F/u on HAs ? ?Outpatient Medications Prior to Visit  ?Medication Sig Dispense Refill  ? butalbital-acetaminophen-caffeine (FIORICET) 50-325-40 MG tablet TAKE 2 TABLETS BY MOUTH EVERY 6 (SIX) HOURS AS NEEDED. 40 tablet 1  ? Cholecalciferol (VITAMIN D3) 50 MCG (2000 UT) capsule Take 1 capsule (2,000 Units total) by mouth daily. 100 capsule 3  ? rizatriptan (MAXALT-MLT) 10 MG disintegrating tablet Take 1 tablet earliest onset of migraine.  May repeat in 2 hours if needed.  Maximum 2 tablets in 24 hours 10 tablet 5  ? SUMAtriptan (IMITREX) 100 MG tablet TAKE 1 TAB BY MOUTH DAILY AS NEEDED FOR MIGRAINE MAY REPEAT IN 2HOURS IF HEADACHE PERSISTS OR RECURS 9 tablet 5  ? methylphenidate (RITALIN) 10 MG tablet Take 1 tablet (10 mg total) by mouth 2 (two) times daily. Take after breakfast and after lunch 60 tablet 0  ? ?No facility-administered medications prior to visit.  ? ? ?ROS: ?Review of Systems  ?Constitutional:  Positive for fatigue. Negative for activity change, appetite change, chills and unexpected weight change.  ?HENT:  Negative for congestion, mouth sores and sinus pressure.   ?Eyes:  Negative for visual disturbance.  ?Respiratory:  Negative for cough and chest tightness.   ?Gastrointestinal:  Negative for abdominal pain and nausea.  ?Genitourinary:  Negative for difficulty urinating, frequency and vaginal pain.  ?Musculoskeletal:  Negative for back pain and gait problem.  ?Skin:  Negative for pallor and rash.  ?Neurological:  Negative for dizziness, tremors, weakness, numbness and headaches.  ?Psychiatric/Behavioral:  Positive for decreased concentration. Negative for confusion and sleep disturbance. The patient is nervous/anxious.    ? ?Objective:  ?BP (!) 148/102 (BP Location: Left Arm, Patient Position: Sitting, Cuff Size: Large)   Pulse 75   Temp 98.7 ?F (37.1 ?C) (Oral)   Ht '5\' 2"'$  (1.575 m)   Wt 121 lb (54.9 kg)   SpO2 97%   BMI 22.13 kg/m?  ? ?BP Readings from Last 3 Encounters:  ?05/04/21 (!) 148/102  ?03/02/21 (!) 144/78  ?02/22/21 (!) 144/94  ? ? ?Wt Readings from Last 3 Encounters:  ?05/04/21 121 lb (54.9 kg)  ?03/02/21 124 lb (56.2 kg)  ?02/22/21 127 lb (57.6 kg)  ? ? ?Physical Exam ?Constitutional:   ?   General: She is not in acute distress. ?   Appearance: Normal appearance. She is well-developed.  ?HENT:  ?   Head: Normocephalic.  ?   Right Ear: External ear normal.  ?   Left Ear: External ear normal.  ?   Nose: Nose normal.  ?Eyes:  ?   General:     ?   Right eye: No discharge.     ?   Left eye: No discharge.  ?   Conjunctiva/sclera: Conjunctivae normal.  ?   Pupils: Pupils are equal, round, and reactive to light.  ?Neck:  ?   Thyroid: No thyromegaly.  ?   Vascular: No JVD.  ?   Trachea: No tracheal deviation.  ?Cardiovascular:  ?   Rate and Rhythm: Normal rate and regular rhythm.  ?   Heart sounds: Normal heart sounds.  ?Pulmonary:  ?   Effort: No respiratory distress.  ?   Breath sounds:  No stridor. No wheezing.  ?Abdominal:  ?   General: Bowel sounds are normal. There is no distension.  ?   Palpations: Abdomen is soft. There is no mass.  ?   Tenderness: There is no abdominal tenderness. There is no guarding or rebound.  ?Musculoskeletal:     ?   General: No tenderness.  ?   Cervical back: Normal range of motion and neck supple. No rigidity.  ?Lymphadenopathy:  ?   Cervical: No cervical adenopathy.  ?Skin: ?   Findings: No erythema or rash.  ?Neurological:  ?   Cranial Nerves: No cranial nerve deficit.  ?   Motor: No abnormal muscle tone.  ?   Coordination: Coordination normal.  ?   Deep Tendon Reflexes: Reflexes normal.  ?Psychiatric:     ?   Behavior: Behavior normal.     ?   Thought Content: Thought content normal.      ?   Judgment: Judgment normal.  ? ? ? A total time of 30 minutes was spent preparing to see the patient, reviewing tests.  Also, obtaining history and performing comprehensive physical exam.  Additionally, counseling the patient regarding the above listed issues smoking, ADD.   Finally, documenting clinical information in the health records, coordination of care, educating the patient.  ? ?Lab Results  ?Component Value Date  ? WBC 14.4 (H) 05/15/2020  ? HGB 16.1 (H) 05/15/2020  ? HCT 47.9 (H) 05/15/2020  ? PLT 210.0 05/15/2020  ? GLUCOSE 87 05/15/2020  ? CHOL 145 12/29/2018  ? TRIG 62.0 12/29/2018  ? HDL 47.70 12/29/2018  ? [place] 85 12/29/2018  ? ALT 14 05/15/2020  ? AST 16 05/15/2020  ? NA 146 (H) 05/15/2020  ? K 3.9 05/15/2020  ? CL 107 05/15/2020  ? CREATININE 0.87 05/15/2020  ? BUN 19 05/15/2020  ? CO2 28 05/15/2020  ? TSH 2.64 12/29/2018  ? INR 1.02 09/28/2014  ? ? ?No results found. ? ?Assessment & Plan:  ? ?Problem List Items Addressed This Visit   ? ? Tobacco use disorder  ?  Trying to quit smoking ?1 ppd ?4/24 Now on wellbutrin XL per Ms [place], [place] w/smokng cessation clinic ?  ?  ? RESOLVED: Tobacco abuse  ?  Now on wellbutrin XL per Ms [place], [place] w/smokng cessation clinic ?  ?  ? Depression  ?  Now on wellbutrin XL per Ms [place], [place] w/smokng cessation clinic. ?  ?  ? ADD (attention deficit disorder)  ?  ADD - worse since she quit smoking. ?Prescribed Ritalin ? Potential benefits of a long term amphetamines  use as well as potential risks  and complications were explained to the patient and were aknowledged. ? ?  ?  ?  ? ? ?Meds ordered this encounter  ?Medications  ? DISCONTD: methylphenidate (RITALIN) 10 MG tablet  ?  Sig: Take 20 mg after breakfast and 10 mg after lunch  ?  Dispense:  60 tablet  ?  Refill:  0  ? methylphenidate (RITALIN) 10 MG tablet  ?  Sig: Take 20 mg after breakfast and 10 mg after lunch  ?  Dispense:  90 tablet  ?  Refill:  0  ?  ? ? ?Follow-up: Return in about 2 months (around  07/04/2021) for a follow-up visit. ? ?Walker Kehr, MD ?

## 2021-05-04 NOTE — Assessment & Plan Note (Signed)
Trying to quit smoking ?1 ppd ?4/24 Now on wellbutrin XL per Ms [place], [place] w/smokng cessation clinic ?

## 2021-05-04 NOTE — Assessment & Plan Note (Signed)
Now on wellbutrin XL per Ms [place], [place] w/smokng cessation clinic ?

## 2021-05-14 ENCOUNTER — Encounter: Payer: Self-pay | Admitting: Internal Medicine

## 2021-05-14 NOTE — Assessment & Plan Note (Signed)
ADD - worse since she quit smoking. ?Prescribed Ritalin ? Potential benefits of a long term amphetamines  use as well as potential risks  and complications were explained to the patient and were aknowledged. ? ?

## 2021-05-17 IMAGING — CT CT HEART SCORING
2 series · 16 of 20 positions shown, 18 images · non-contrast
Comparison: 03/31/2007.
COMPARISON: 03/31/2007.

Addendum:
EXAM:
OVER-READ INTERPRETATION  CT CHEST

The following report is an over-read performed by radiologist Dr.
Melynda Billiot [REDACTED] on 01/20/2019. This
over-read does not include interpretation of cardiac or coronary
anatomy or pathology. The coronary calcium score interpretation by
the cardiologist is attached.
CLINICAL DATA: Risk stratification
Coronary Calcium Score
TECHNIQUE: The patient was scanned on a Siemens Force scanner. Axial
non-contrast 3 mm slices were carried out through the heart. The
data set was analyzed on a dedicated work station and scored using
the Agatson method.

[Series 2: casc 3.0 i36f 2 bestdiast 69 % · axial · 0.30mm/px · z∈[-243,-138]mm · 8 of 47 slices shown, 10 images]
[im 6/47  vessel]
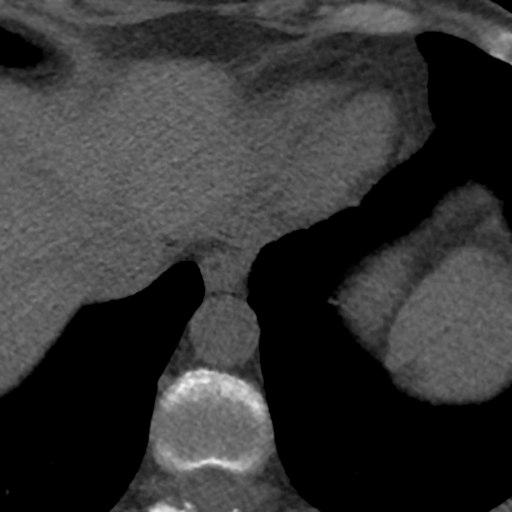
[im 6/47  lung]
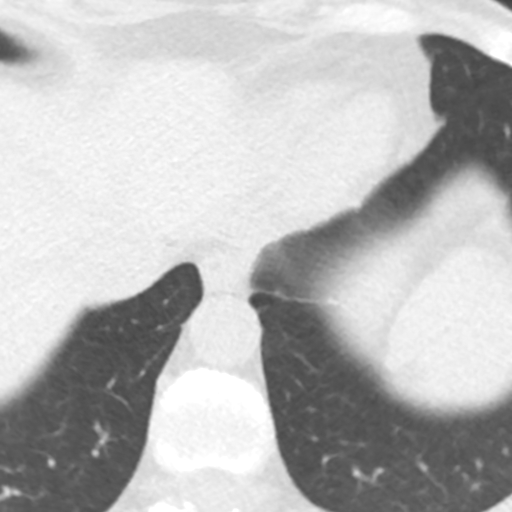
[im 11/47  vessel]
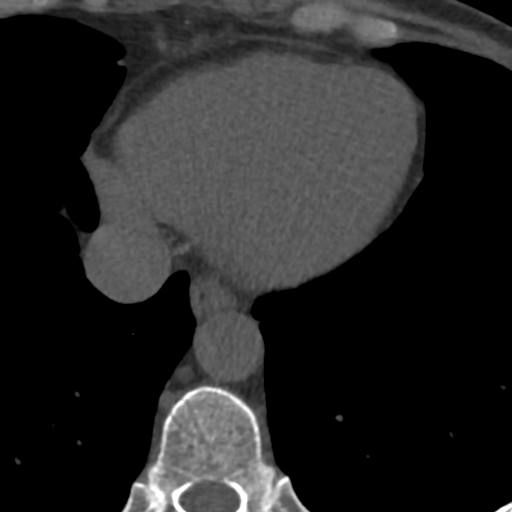
[im 16/47  vessel]
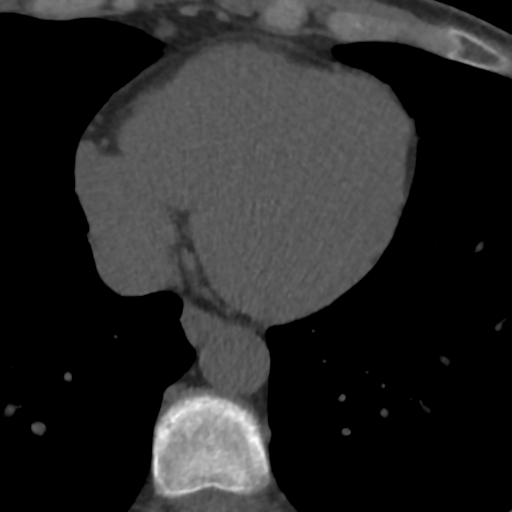
[im 21/47  vessel]
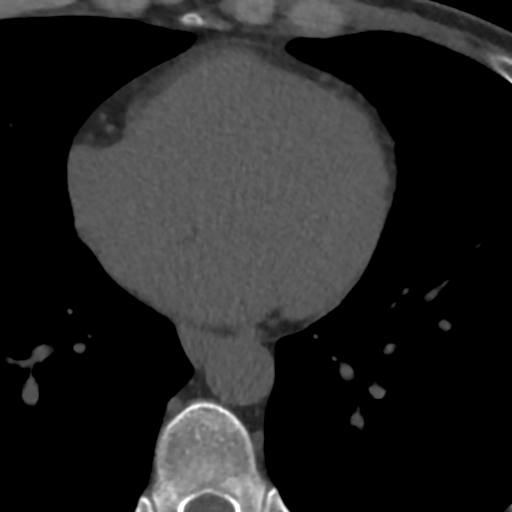
[im 26/47  vessel]
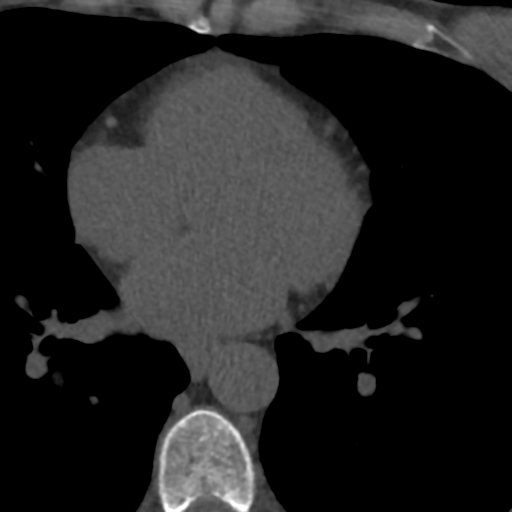
[im 26/47  lung]
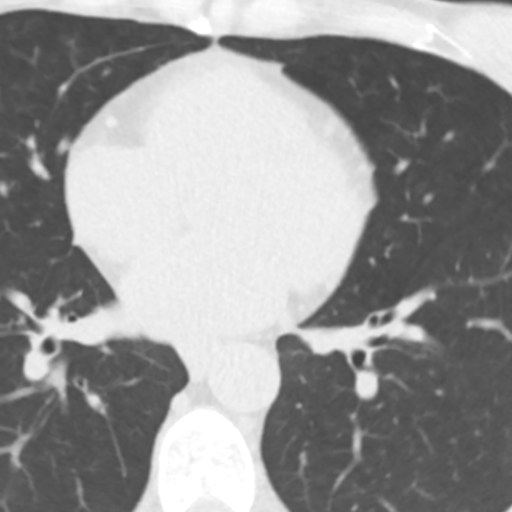
[im 31/47  vessel]
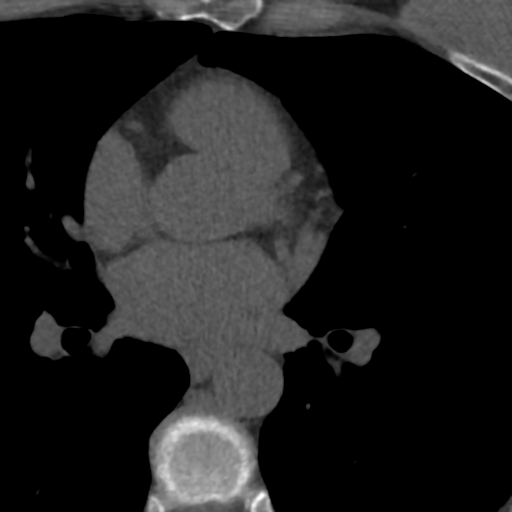
[im 36/47  vessel]
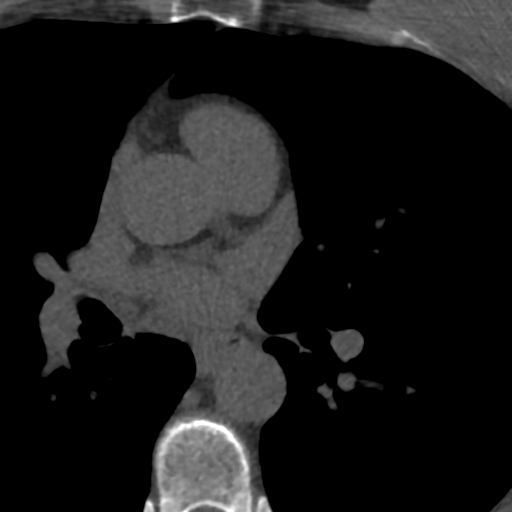
[im 41/47  vessel]
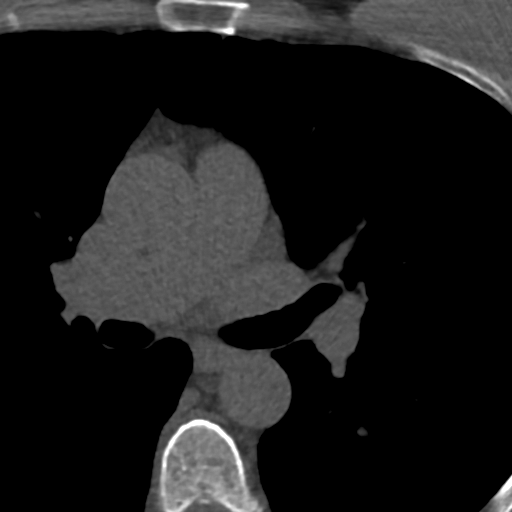

[Series 4: lung st 67 % · axial · 0.63mm/px · z∈[-242,-138]mm · 8 of 47 slices shown]
[im 6/47  lung]
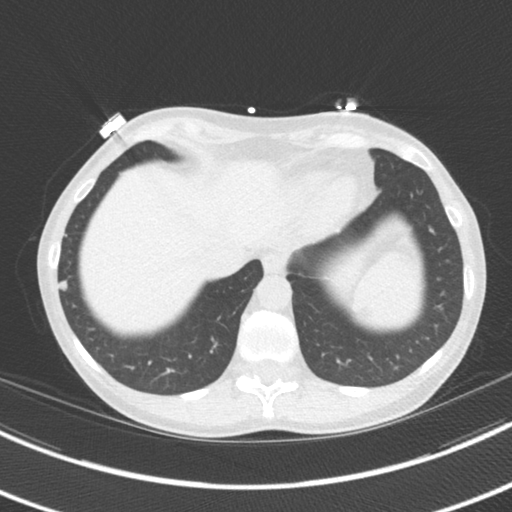
[im 11/47  lung]
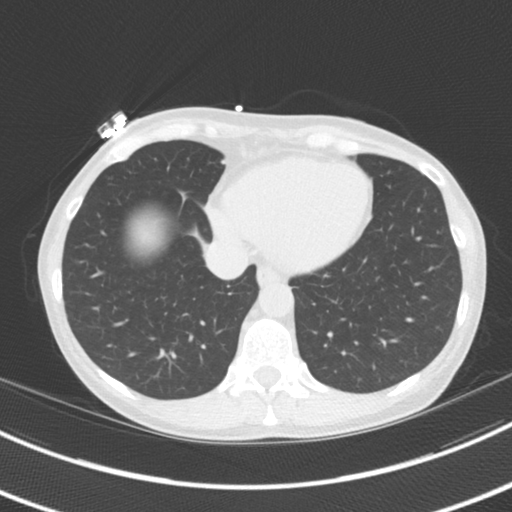
[im 16/47  lung]
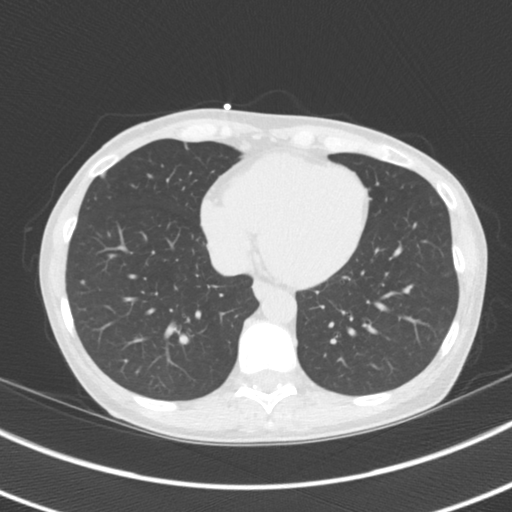
[im 21/47  lung]
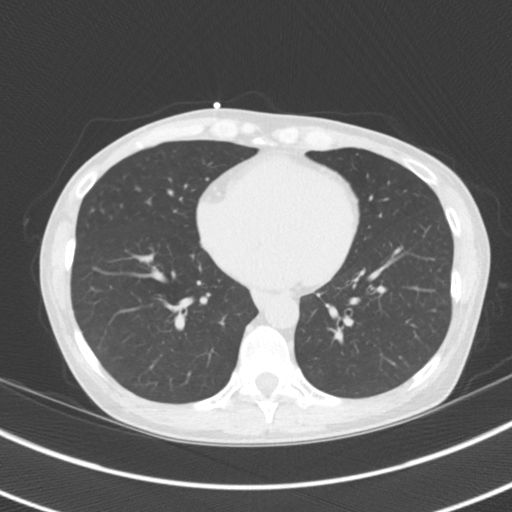
[im 26/47  lung]
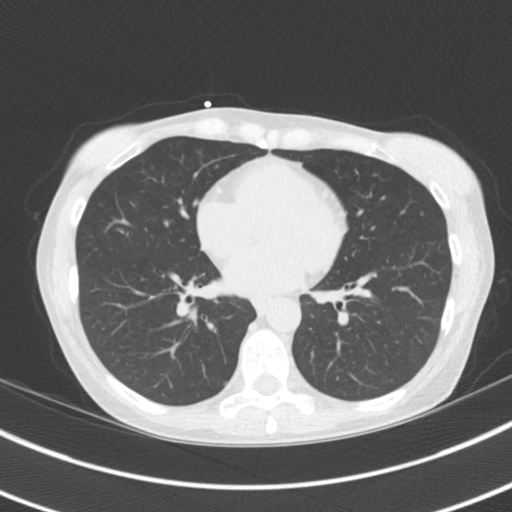
[im 31/47  lung]
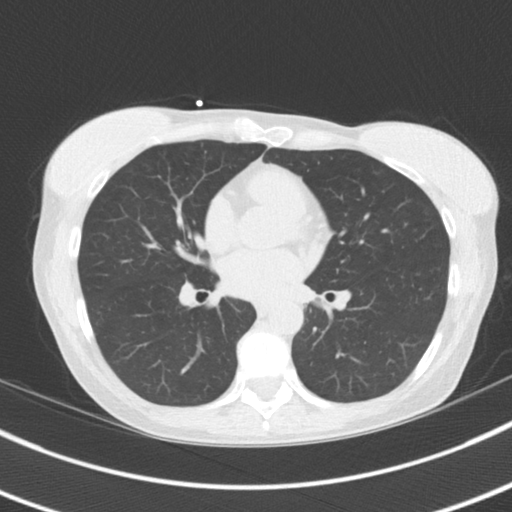
[im 36/47  lung]
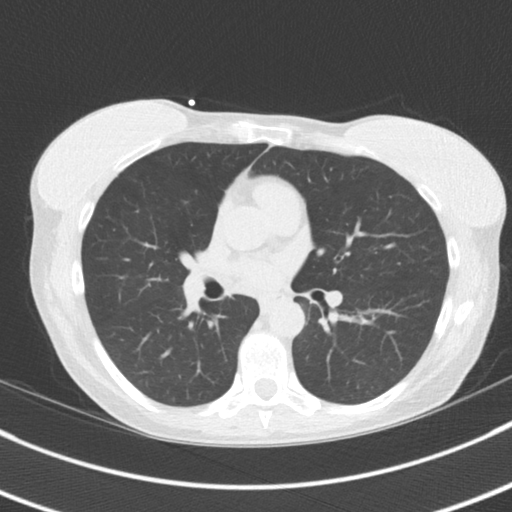
[im 41/47  lung]
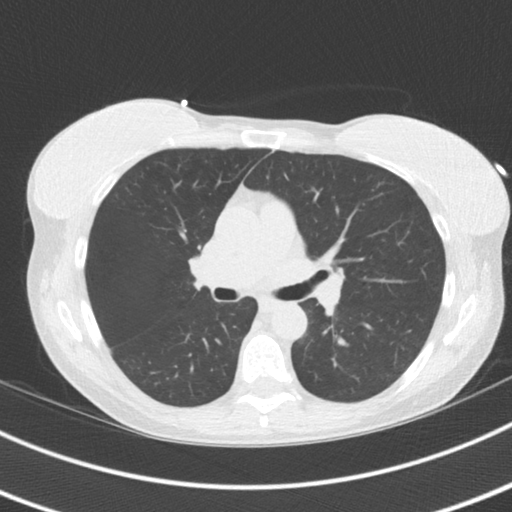

[16 of 20 positions shown; findings below may reference images not displayed]

FINDINGS: Vascular: Heart is normal size.  Visualized aorta normal caliber.

Mediastinum/Nodes: No adenopathy in the lower mediastinum or hila.

Lungs/Pleura: Right middle lobe nodule measures 4 mm on image 14.
This is stable since 1552 compatible with benign nodule. Lingular
nodule on the same image measures 2-3 mm and is stable since 1552.
No confluent opacities or effusions.

Upper Abdomen: Imaging into the upper abdomen shows no acute
findings.

Musculoskeletal: Bilateral breast implants. Chest wall soft tissues
are unremarkable otherwise. No acute bony abnormality.
IMPRESSION: Small bilateral pulmonary nodules are stable since 1552 compatible
with benign nodules.

No acute extracardiac findings or significant abnormality.
FINDINGS: Non-cardiac: See separate report from [REDACTED].

Ascending Aorta: Normal caliber.

Pericardium: Normal.

Coronary arteries: Normal origins.
IMPRESSION: Coronary calcium score of 0.

*** End of Addendum ***
EXAM:
OVER-READ INTERPRETATION  CT CHEST

The following report is an over-read performed by radiologist Dr.
Melynda Billiot [REDACTED] on 01/20/2019. This
over-read does not include interpretation of cardiac or coronary
anatomy or pathology. The coronary calcium score interpretation by
the cardiologist is attached.
FINDINGS: Vascular: Heart is normal size.  Visualized aorta normal caliber.

Mediastinum/Nodes: No adenopathy in the lower mediastinum or hila.

Lungs/Pleura: Right middle lobe nodule measures 4 mm on image 14.
This is stable since 1552 compatible with benign nodule. Lingular
nodule on the same image measures 2-3 mm and is stable since 1552.
No confluent opacities or effusions.

Upper Abdomen: Imaging into the upper abdomen shows no acute
findings.

Musculoskeletal: Bilateral breast implants. Chest wall soft tissues
are unremarkable otherwise. No acute bony abnormality.
IMPRESSION: Small bilateral pulmonary nodules are stable since 1552 compatible
with benign nodules.

No acute extracardiac findings or significant abnormality.

## 2021-05-17 NOTE — Progress Notes (Signed)
?Shelly Green D.O. ?[facility] ?Shelly Green ?Phone: [phone] ?Subjective:   ?I, Shelly Green, am serving as a scribe for Dr. Hulan Green. ? ?This visit occurred during the SARS-CoV-2 public health emergency.  Safety protocols were in place, including screening questions prior to the visit, additional usage of staff PPE, and extensive cleaning of exam room while observing appropriate contact time as indicated for disinfecting solutions.  ?I'm seeing this patient by the request  of:  Plotnikov, Evie Lacks, MD ? ?CC: back and hip pain  ? ?[id number]  ?02/22/2021 ?Patient is significantly improved since stopping the Femara.  No need for osteopathic manipulation today.  Patient's weight is stable.  No other signs that would be concern for systemic illness at this time ? ?Patient no longer wants to take the medication.  Is following up with hematology oncology.  Did have a bone scan that did show patient did have a little uptake at the T8 area.  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  Patient now has had the same 1 since April.  Discussed with patient at great length about the potential for getting another bone scan versus another x-ray.  Patient would like to consider doing that again in 2 to 3 months for the x-ray first and see.  We also discussed the possibility of an MRI but patient is concerned that with an MRI we could potentially find something else that would be needing further work-up and she would like to avoid that if possible.  Patient understands if any significant change in health she needs to seek medical attention immediately.  Patient is in agreement with the plan. ? ?Update 05/28/2021 ?Shelly Green is a 61 y.o. female coming in with complaint of thoracic spine pain. Patient states that she is doing well. Feeling better every day.  ? ? ? ?  ? ?Past Medical History:  ?Diagnosis Date  ? Arthritis   ? Cancer [facility])   ? breast    ? History of colon polyps   ? IBS (irritable bowel syndrome)   ? Menopausal disorder   ? Migraines   ? Seasonal allergies   ? Social anxiety disorder   ? ?Past Surgical History:  ?Procedure Laterality Date  ? CARPAL TUNNEL RELEASE  08/20/2011  ? Procedure: CARPAL TUNNEL RELEASE;  Surgeon: Cammie Sickle., MD;  Location: [place];  Service: Orthopedics;  Laterality: Right;  ? HEMORROIDECTOMY    ? LAPAROSCOPIC CHOLECYSTECTOMY  2007  ? TOTAL ABDOMINAL HYSTERECTOMY    ? ?Social History  ? ?Socioeconomic History  ? Marital status: Married  ?  Spouse name: Not on file  ? Number of children: Not on file  ? Years of education: Not on file  ? Highest education level: Not on file  ?Occupational History  ? Not on file  ?Tobacco Use  ? Smoking status: Every Day  ?  Packs/day: 1.00  ?  Years: 35.00  ?  Pack years: 35.00  ?  Types: Cigarettes  ? Smokeless tobacco: Never  ?Substance and Sexual Activity  ? Alcohol use: No  ?  Alcohol/week: 0.0 standard drinks  ? Drug use: No  ? Sexual activity: Yes  ?Other Topics Concern  ? Not on file  ?Social History Narrative  ? Right handed  ? ?Social Determinants of Health  ? ?Financial Resource Strain: Not on file  ?Food Insecurity: Not on file  ?Transportation Needs: Not on file  ?  Physical Activity: Not on file  ?Stress: Not on file  ?Social Connections: Not on file  ? ?Allergies  ?Allergen Reactions  ? Codeine Anaphylaxis  ? Hydrocodone Shortness Of Breath  ? Penicillins Anaphylaxis  ?  As a child ?Has patient had a PCN reaction causing immediate rash, facial/tongue/throat swelling, SOB or lightheadedness with hypotension: yes ?Has patient had a PCN reaction causing severe rash involving mucus membranes or skin necrosis: unknown ?Has patient had a PCN reaction that required hospitalization: yes ?Has patient had a PCN reaction occurring within the last 10 years: no ?If all of the above answers are "NO", then may proceed with Cephalosporin use. ?  ? Sulfa Antibiotics  Anaphylaxis and Rash  ? Tramadol Shortness Of Breath  ? Clindamycin/Lincomycin   ?  rush  ? Wellbutrin [Bupropion]   ?  HAs  ? ?Family History  ?Problem Relation Age of Onset  ? Arthritis Mother   ? Lymphoma Mother   ? Colon cancer Sister 32  ? Mental illness Daughter   ? Mental illness Maternal Aunt   ? Arthritis Maternal Grandmother   ? Hypertension Maternal Grandfather   ? ? ? ? ? ?Current Outpatient Medications (Analgesics):  ?  butalbital-acetaminophen-caffeine (FIORICET) 50-325-40 MG tablet, TAKE 2 TABLETS BY MOUTH EVERY 6 (SIX) HOURS AS NEEDED. ?  rizatriptan (MAXALT-MLT) 10 MG disintegrating tablet, Take 1 tablet earliest onset of migraine.  May repeat in 2 hours if needed.  Maximum 2 tablets in 24 hours ?  SUMAtriptan (IMITREX) 100 MG tablet, TAKE 1 TAB BY MOUTH DAILY AS NEEDED FOR MIGRAINE MAY REPEAT IN 2HOURS IF HEADACHE PERSISTS OR RECURS ? ? ?Current Outpatient Medications (Other):  ?  Cholecalciferol (VITAMIN D3) 50 MCG (2000 UT) capsule, Take 1 capsule (2,000 Units total) by mouth daily. ?  methylphenidate (RITALIN) 10 MG tablet, Take 20 mg after breakfast and 10 mg after lunch ? ? ?Objective  ?Blood pressure 120/88, pulse 95, height '5\' 2"'$  (1.575 m), weight 112 lb (50.8 kg), SpO2 99 %. ?  ?General: No apparent distress alert and oriented x3 mood and affect normal, dressed appropriately.  ?HEENT: Pupils equal, extraocular movements intact  ?Respiratory: Patient's speak in full sentences and does not appear short of breath  ?Cardiovascular: No lower extremity edema, non tender, no erythema  ?Gait normal with good balance and coordination.  ?MSK:  sitting comfortably, in good mood  ? ?  ?Impression and Recommendations:  ?  ?The above documentation has been reviewed and is accurate and complete Shelly Pulley, DO ? ? ? ?

## 2021-05-28 ENCOUNTER — Ambulatory Visit: Payer: 59 | Admitting: Family Medicine

## 2021-05-28 DIAGNOSIS — G8929 Other chronic pain: Secondary | ICD-10-CM

## 2021-05-28 DIAGNOSIS — M545 Low back pain, unspecified: Secondary | ICD-10-CM

## 2021-05-28 NOTE — Patient Instructions (Signed)
Thanks for making my day ?Great to see you smiling ?Ask oncologist about xray and we can put that in ?See me when you need me ?

## 2021-05-28 NOTE — Assessment & Plan Note (Signed)
Patient is doing better at this time.  We discussed with patient about repeating the x-ray.  Patient wants to hold and discuss with her oncologist.  Doing much better with her off of the medications.  Patient is feeling like herself and is being much more active.  Patient will follow-up with me as needed otherwise.  We did discuss that if any significant worsening symptoms to seek medical attention immediately so we can consider advanced imaging sooner secondary to past medical history. ?

## 2021-05-30 ENCOUNTER — Ambulatory Visit: Payer: 59 | Admitting: Internal Medicine

## 2021-07-02 ENCOUNTER — Ambulatory Visit (INDEPENDENT_AMBULATORY_CARE_PROVIDER_SITE_OTHER): Payer: 59 | Admitting: Internal Medicine

## 2021-07-02 ENCOUNTER — Encounter: Payer: Self-pay | Admitting: Internal Medicine

## 2021-07-02 DIAGNOSIS — F988 Other specified behavioral and emotional disorders with onset usually occurring in childhood and adolescence: Secondary | ICD-10-CM | POA: Diagnosis not present

## 2021-07-02 DIAGNOSIS — F339 Major depressive disorder, recurrent, unspecified: Secondary | ICD-10-CM

## 2021-07-02 MED ORDER — METHYLPHENIDATE HCL ER 20 MG PO TBCR: 20.0000 mg | EXTENDED_RELEASE_TABLET | Freq: Every day | ORAL | 0 refills | Status: DC

## 2021-07-02 NOTE — Progress Notes (Signed)
Subjective:  Patient ID: Shelly Green, female    DOB: [birth year]  Age: 61 y.o. MRN: [id number]  CC: No chief complaint on file.   HPI GWENDOLYNN Green presents for ADD, trying to quit smoking - not smoking, sweats. F/u on ADD "Edgy" on wellbutrin...  Outpatient Medications Prior to Visit  Medication Sig Dispense Refill   buPROPion (WELLBUTRIN SR) 150 MG 12 hr tablet Take by mouth.     butalbital-acetaminophen-caffeine (FIORICET) 50-325-40 MG tablet TAKE 2 TABLETS BY MOUTH EVERY 6 (SIX) HOURS AS NEEDED. 40 tablet 1   Cholecalciferol (VITAMIN D3) 50 MCG (2000 UT) capsule Take 1 capsule (2,000 Units total) by mouth daily. 100 capsule 3   nicotine (NICODERM CQ - DOSED IN MG/24 HR) 7 mg/24hr patch 7 mg daily.     rizatriptan (MAXALT-MLT) 10 MG disintegrating tablet Take 1 tablet earliest onset of migraine.  May repeat in 2 hours if needed.  Maximum 2 tablets in 24 hours 10 tablet 5   SUMAtriptan (IMITREX) 100 MG tablet TAKE 1 TAB BY MOUTH DAILY AS NEEDED FOR MIGRAINE MAY REPEAT IN 2HOURS IF HEADACHE PERSISTS OR RECURS 9 tablet 5   methylphenidate (RITALIN) 10 MG tablet Take 20 mg after breakfast and 10 mg after lunch 90 tablet 0   No facility-administered medications prior to visit.    ROS: Review of Systems  Constitutional:  Negative for activity change, appetite change, chills, fatigue and unexpected weight change.  HENT:  Negative for congestion, mouth sores and sinus pressure.   Eyes:  Negative for visual disturbance.  Respiratory:  Negative for cough and chest tightness.   Gastrointestinal:  Negative for abdominal pain and nausea.  Genitourinary:  Negative for difficulty urinating, frequency and vaginal pain.  Musculoskeletal:  Negative for back pain and gait problem.  Skin:  Negative for pallor and rash.  Neurological:  Negative for dizziness, tremors, weakness, numbness and headaches.  Psychiatric/Behavioral:  Negative for confusion and sleep disturbance.     Objective:   BP (!) 160/100 (BP Location: Left Arm, Patient Position: Sitting, Cuff Size: Large)   Pulse (!) 52   Temp 98.1 F (36.7 C) (Oral)   Ht '5\' 2"'$  (1.575 m)   Wt 123 lb (55.8 kg)   SpO2 96%   BMI 22.50 kg/m   BP Readings from Last 3 Encounters:  07/02/21 (!) 160/100  05/28/21 120/88  05/04/21 (!) 148/102    Wt Readings from Last 3 Encounters:  07/02/21 123 lb (55.8 kg)  05/28/21 112 lb (50.8 kg)  05/04/21 121 lb (54.9 kg)    Physical Exam Constitutional:      General: She is not in acute distress.    Appearance: She is well-developed.  HENT:     Head: Normocephalic.     Right Ear: External ear normal.     Left Ear: External ear normal.     Nose: Nose normal.  Eyes:     General:        Right eye: No discharge.        Left eye: No discharge.     Conjunctiva/sclera: Conjunctivae normal.     Pupils: Pupils are equal, round, and reactive to light.  Neck:     Thyroid: No thyromegaly.     Vascular: No JVD.     Trachea: No tracheal deviation.  Cardiovascular:     Rate and Rhythm: Normal rate and regular rhythm.     Heart sounds: Normal heart sounds.  Pulmonary:     Effort: No  respiratory distress.     Breath sounds: No stridor. No wheezing.  Abdominal:     General: Bowel sounds are normal. There is no distension.     Palpations: Abdomen is soft. There is no mass.     Tenderness: There is no abdominal tenderness. There is no guarding or rebound.  Musculoskeletal:        General: No tenderness.     Cervical back: Normal range of motion and neck supple. No rigidity.  Lymphadenopathy:     Cervical: No cervical adenopathy.  Skin:    Findings: No erythema or rash.  Neurological:     Mental Status: She is oriented to person, place, and time.     Cranial Nerves: No cranial nerve deficit.     Motor: No abnormal muscle tone.     Coordination: Coordination normal.     Deep Tendon Reflexes: Reflexes normal.  Psychiatric:        Behavior: Behavior normal.        Thought  Content: Thought content normal.        Judgment: Judgment normal.     Lab Results  Component Value Date   WBC 14.4 (H) 05/15/2020   HGB 16.1 (H) 05/15/2020   HCT 47.9 (H) 05/15/2020   PLT 210.0 05/15/2020   GLUCOSE 87 05/15/2020   CHOL 145 12/29/2018   TRIG 62.0 12/29/2018   HDL 47.70 12/29/2018   LDLCALC 85 12/29/2018   ALT 14 05/15/2020   AST 16 05/15/2020   NA 146 (H) 05/15/2020   K 3.9 05/15/2020   CL 107 05/15/2020   CREATININE 0.87 05/15/2020   BUN 19 05/15/2020   CO2 28 05/15/2020   TSH 2.64 12/29/2018   INR 1.02 09/28/2014    No results found.  Assessment & Plan:   Problem List Items Addressed This Visit     ADD (attention deficit disorder)    Will try Ritalin ER  Potential benefits of a long term amphetamines  use as well as potential risks  and complications were explained to the patient and were aknowledged.      Depression    On Wellbutrin      Relevant Medications   buPROPion (WELLBUTRIN SR) 150 MG 12 hr tablet      Meds ordered this encounter  Medications   methylphenidate (METADATE ER) 20 MG ER tablet    Sig: Take 1 tablet (20 mg total) by mouth daily.    Dispense:  30 tablet    Refill:  0    Please fill on or after 08/31/21   methylphenidate (METADATE ER) 20 MG ER tablet    Sig: Take 1 tablet (20 mg total) by mouth daily.    Dispense:  30 tablet    Refill:  0    Please fill on or after 08/01/21   methylphenidate (METADATE ER) 20 MG ER tablet    Sig: Take 1 tablet (20 mg total) by mouth daily.    Dispense:  30 tablet    Refill:  0    Please fill on or after 07/02/21      Follow-up: Return in about 3 months (around 10/02/2021) for a follow-up visit.  Walker Kehr, MD

## 2021-07-02 NOTE — Assessment & Plan Note (Signed)
Will try Ritalin ER  Potential benefits of a long term amphetamines  use as well as potential risks  and complications were explained to the patient and were aknowledged.

## 2021-07-02 NOTE — Assessment & Plan Note (Signed)
On Wellbutrin

## 2021-07-11 ENCOUNTER — Encounter: Payer: Self-pay | Admitting: Internal Medicine

## 2021-07-17 ENCOUNTER — Other Ambulatory Visit: Payer: Self-pay | Admitting: Neurology

## 2021-07-19 ENCOUNTER — Ambulatory Visit: Payer: 59 | Admitting: Internal Medicine

## 2021-07-19 ENCOUNTER — Encounter: Payer: Self-pay | Admitting: Internal Medicine

## 2021-07-19 DIAGNOSIS — F39 Unspecified mood [affective] disorder: Secondary | ICD-10-CM | POA: Insufficient documentation

## 2021-07-19 NOTE — Progress Notes (Signed)
Subjective:  Patient ID: Shelly Green, female    DOB: 01/17/1961  Age: 61 y.o. MRN: [id number]  CC: Discuss medication  (She would like to discuss a regimen to help with her mood, possibly starting a mood disorder)   HPI Shelly Green presents for mood disorder aggravated by wellbutrin. Not smoking.   Outpatient Medications Prior to Visit  Medication Sig Dispense Refill   butalbital-acetaminophen-caffeine (FIORICET) 50-325-40 MG tablet TAKE 2 TABLETS BY MOUTH EVERY 6 (SIX) HOURS AS NEEDED. 40 tablet 1   Cholecalciferol (VITAMIN D3) 50 MCG (2000 UT) capsule Take 1 capsule (2,000 Units total) by mouth daily. 100 capsule 3   methylphenidate (METADATE ER) 20 MG ER tablet Take 1 tablet (20 mg total) by mouth daily. 30 tablet 0   methylphenidate (METADATE ER) 20 MG ER tablet Take 1 tablet (20 mg total) by mouth daily. 30 tablet 0   methylphenidate (METADATE ER) 20 MG ER tablet Take 1 tablet (20 mg total) by mouth daily. 30 tablet 0   nicotine (NICODERM CQ - DOSED IN MG/24 HR) 7 mg/24hr patch 7 mg daily.     rizatriptan (MAXALT-MLT) 10 MG disintegrating tablet Take 1 tablet earliest onset of migraine.  May repeat in 2 hours if needed.  Maximum 2 tablets in 24 hours 10 tablet 5   SUMAtriptan (IMITREX) 100 MG tablet TAKE 1 TAB BY MOUTH DAILY AS NEEDED FOR MIGRAINE MAY REPEAT IN 2HOURS IF HEADACHE PERSISTS OR RECURS 9 tablet 5   buPROPion (WELLBUTRIN SR) 150 MG 12 hr tablet Take by mouth. (Patient not taking: Reported on 07/19/2021)     No facility-administered medications prior to visit.    ROS: Review of Systems  Constitutional:  Negative for activity change, appetite change, chills, fatigue and unexpected weight change.  HENT:  Negative for congestion, mouth sores and sinus pressure.   Eyes:  Negative for visual disturbance.  Respiratory:  Negative for cough and chest tightness.   Gastrointestinal:  Negative for abdominal pain and nausea.  Genitourinary:  Negative for difficulty  urinating, frequency and vaginal pain.  Musculoskeletal:  Negative for back pain and gait problem.  Skin:  Negative for pallor and rash.  Neurological:  Negative for dizziness, tremors, weakness, numbness and headaches.  Psychiatric/Behavioral:  Positive for dysphoric mood. Negative for confusion, decreased concentration, sleep disturbance and suicidal ideas. The patient is nervous/anxious.     Objective:  BP 122/70 (BP Location: Left Arm, Patient Position: Sitting, Cuff Size: Normal)   Pulse (!) 59   Temp 97.7 F (36.5 C) (Oral)   Ht '5\' 2"'$  (1.575 m)   Wt 125 lb 6.4 oz (56.9 kg)   SpO2 94%   BMI 22.94 kg/m   BP Readings from Last 3 Encounters:  07/19/21 122/70  07/02/21 (!) 160/100  05/28/21 120/88    Wt Readings from Last 3 Encounters:  07/19/21 125 lb 6.4 oz (56.9 kg)  07/02/21 123 lb (55.8 kg)  05/28/21 112 lb (50.8 kg)    Physical Exam Constitutional:      General: She is not in acute distress.    Appearance: She is well-developed.  HENT:     Head: Normocephalic.     Right Ear: External ear normal.     Left Ear: External ear normal.     Nose: Nose normal.  Eyes:     General:        Right eye: No discharge.        Left eye: No discharge.  Conjunctiva/sclera: Conjunctivae normal.     Pupils: Pupils are equal, round, and reactive to light.  Neck:     Thyroid: No thyromegaly.     Vascular: No JVD.     Trachea: No tracheal deviation.  Cardiovascular:     Rate and Rhythm: Normal rate and regular rhythm.     Heart sounds: Normal heart sounds.  Pulmonary:     Effort: No respiratory distress.     Breath sounds: No stridor. No wheezing.  Abdominal:     General: Bowel sounds are normal. There is no distension.     Palpations: Abdomen is soft. There is no mass.     Tenderness: There is no abdominal tenderness. There is no guarding or rebound.  Musculoskeletal:        General: No tenderness.     Cervical back: Normal range of motion and neck supple. No rigidity.   Lymphadenopathy:     Cervical: No cervical adenopathy.  Skin:    Findings: No erythema or rash.  Neurological:     Mental Status: She is oriented to person, place, and time.     Cranial Nerves: No cranial nerve deficit.     Motor: No abnormal muscle tone.     Coordination: Coordination normal.     Deep Tendon Reflexes: Reflexes normal.  Psychiatric:        Behavior: Behavior normal.        Thought Content: Thought content normal.        Judgment: Judgment normal.     A total time of 30 minutes was spent obtaining history and performing, counseling the patient regarding the mood issues and Vraylar.   Finally, documenting clinical information in the health records, educating the patient. I   Lab Results  Component Value Date   WBC 14.4 (H) 05/15/2020   HGB 16.1 (H) 05/15/2020   HCT 47.9 (H) 05/15/2020   PLT 210.0 05/15/2020   GLUCOSE 87 05/15/2020   CHOL 145 12/29/2018   TRIG 62.0 12/29/2018   HDL 47.70 12/29/2018   LDLCALC 85 12/29/2018   ALT 14 05/15/2020   AST 16 05/15/2020   NA 146 (H) 05/15/2020   K 3.9 05/15/2020   CL 107 05/15/2020   CREATININE 0.87 05/15/2020   BUN 19 05/15/2020   CO2 28 05/15/2020   TSH 2.64 12/29/2018   INR 1.02 09/28/2014    No results found.  Assessment & Plan:   Problem List Items Addressed This Visit     Mood disorder ([place])    Worse Start Vraylar low dose if covered         No orders of the defined types were placed in this encounter.     Follow-up: Return in about 3 months (around 10/19/2021) for a follow-up visit.  Walker Kehr, MD

## 2021-07-19 NOTE — Progress Notes (Deleted)
NEUROLOGY FOLLOW UP OFFICE NOTE  Shelly Green [id number]  Assessment/Plan:   Migraine without aura, without status migrainosus, not intractable Elevated blood pressure   Migraine prevention:  Stop propranolol.  Start topiramate '25mg'$  at bedtime.  If no improvement in 4 weeks, increase to '50mg'$  at bedtime Migraine rescue:  Stop sumatriptan.  Start rizatriptan '10mg'$  Limit use of pain relievers to no more than 2 days out of week to prevent risk of rebound or medication-overuse headache. Keep headache diary Follow up with PCP regarding blood pressure Follow up with me     Subjective:  Shelly Green is a 61 year old right-handed female with migraines, ADD, anxiety, arthritis, IBS, migraines and history of breast cancer in remission who follows up for migraines.   UPDATE: Last visit, changed from propranolol to topiramate.  . Intensity:  moderate Duration:  4 days Frequency:  2 a month Rescue protocol:  Tylenol first line, rizatriptan second line.  Will take Fioricet if onset is severe and quick. Current NSAIDS/analgesics:  ASA Current triptans:  rizatriptan '10mg'$  Current ergotamine:  none Current anti-emetic:  none Current muscle relaxants:  none Current Antihypertensive medications:  none Current Antidepressant medications:  none Current Anticonvulsant medications:  Topiramate '25mg'$  QHS, gabapentin (for hot flashes) Current anti-CGRP:  none Current Vitamins/Herbal/Supplements:  MVI, D Current Antihistamines/Decongestants:  none Other therapy:  OMM Hormone/birth control:  Femara Other medications:  Adderall XR, BuSpar, lorazepam   Caffeine:  No coffee.  Maybe 1 small coca cola daily Diet:  Drinks little water. Exercise:  yes Depression:  yes; Anxiety:  none Other pain:  Chronic low back pain Sleep:  Poor.  Hot flashes at night.  Takes gabapentin   HISTORY:  Started having headaches as a child.  They were moderate to severe bifrontal pressupe to throbbing.  They are  associated with nausea, photophobia and phonophobia.  If takes something early and lay down, they will last within an hour, otherwise all day.  They occur at least 2 to 3 days a week.  Triggers include change in weather and seasonal allergies.  Relieving factors include rest.    Increased after starting Wellbutrin.  Severe pain in jaw and back of head.  Neck pain.  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  Associated with nausea, vomiting, photophobia and phonophobia.  They were daily.  Stopped Wellbutrin after 5 days.  Since stopping Wellbutrin, headaches decreased to once a week and now back to baseline.       Past NSAIDS/analgesics:  Diclofenac, tramadol, ibuprofen, Fioricet Past abortive triptans:  sumatriptan '100mg'$  Past abortive ergotamine:  none Past muscle relaxants:  none Past anti-emetic:  Zofran, Compazine Past antihypertensive medications:  propranolol Past antidepressant medications:  Wellbutrin (aggravated headaches), paroxetine, Trintellix Past anticonvulsant medications:  lamotrigine Past anti-CGRP:  none Past vitamins/Herbal/Supplements:  none Past antihistamines/decongestants:  Benadryl, Flonase, Claritin Other past therapies:  none     Family history of headache:  Maternal grandmother, mother, sisters  PAST MEDICAL HISTORY: Past Medical History:  Diagnosis Date   Arthritis    Cancer ([place])    breast    History of colon polyps    IBS (irritable bowel syndrome)    Menopausal disorder    Migraines    Seasonal allergies    Social anxiety disorder     MEDICATIONS: Current Outpatient Medications on File Prior to Visit  Medication Sig Dispense Refill   buPROPion (WELLBUTRIN SR) 150 MG 12  hr tablet Take by mouth.     butalbital-acetaminophen-caffeine (FIORICET) 50-325-40 MG tablet TAKE 2 TABLETS BY MOUTH EVERY 6 (SIX) HOURS AS NEEDED. 40 tablet 1   Cholecalciferol (VITAMIN D3) 50 MCG  (2000 UT) capsule Take 1 capsule (2,000 Units total) by mouth daily. 100 capsule 3   methylphenidate (METADATE ER) 20 MG ER tablet Take 1 tablet (20 mg total) by mouth daily. 30 tablet 0   methylphenidate (METADATE ER) 20 MG ER tablet Take 1 tablet (20 mg total) by mouth daily. 30 tablet 0   methylphenidate (METADATE ER) 20 MG ER tablet Take 1 tablet (20 mg total) by mouth daily. 30 tablet 0   nicotine (NICODERM CQ - DOSED IN MG/24 HR) 7 mg/24hr patch 7 mg daily.     rizatriptan (MAXALT-MLT) 10 MG disintegrating tablet Take 1 tablet earliest onset of migraine.  May repeat in 2 hours if needed.  Maximum 2 tablets in 24 hours 10 tablet 5   SUMAtriptan (IMITREX) 100 MG tablet TAKE 1 TAB BY MOUTH DAILY AS NEEDED FOR MIGRAINE MAY REPEAT IN 2HOURS IF HEADACHE PERSISTS OR RECURS 9 tablet 5   No current facility-administered medications on file prior to visit.    ALLERGIES: Allergies  Allergen Reactions   Codeine Anaphylaxis   Hydrocodone Shortness Of Breath   Penicillins Anaphylaxis    As a child Has patient had a PCN reaction causing immediate rash, facial/tongue/throat swelling, SOB or lightheadedness with hypotension: yes Has patient had a PCN reaction causing severe rash involving mucus membranes or skin necrosis: unknown Has patient had a PCN reaction that required hospitalization: yes Has patient had a PCN reaction occurring within the last 10 years: no If all of the above answers are "NO", then may proceed with Cephalosporin use.    Sulfa Antibiotics Anaphylaxis and Rash   Tramadol Shortness Of Breath   Clindamycin/Lincomycin     rush   Wellbutrin [Bupropion]     HAs    FAMILY HISTORY: Family History  Problem Relation Age of Onset   Arthritis Mother    Lymphoma Mother    Colon cancer Sister 43   Mental illness Daughter    Mental illness Maternal Aunt    Arthritis Maternal Grandmother    Hypertension Maternal Grandfather       Objective:  *** General: No acute distress.   Patient appears ***-groomed.   Head:  Normocephalic/atraumatic Eyes:  Fundi examined but not visualized Neck: supple, no paraspinal tenderness, full range of motion Heart:  Regular rate and rhythm Lungs:  Clear to auscultation bilaterally Back: No paraspinal tenderness Neurological Exam: alert and oriented to person, place, and time.  Speech fluent and not dysarthric, language intact.  CN II-XII intact. Bulk and tone normal, muscle strength 5/5 throughout.  Sensation to light touch intact.  Deep tendon reflexes 2+ throughout, toes downgoing.  Finger to nose testing intact.  Gait normal, Romberg negative.   Metta Clines, DO  CC: Lew Dawes, MD

## 2021-07-19 NOTE — Assessment & Plan Note (Addendum)
Worse Start Vraylar low dose if covered

## 2021-07-20 ENCOUNTER — Ambulatory Visit: Payer: 59 | Admitting: Neurology

## 2021-07-20 ENCOUNTER — Encounter: Payer: Self-pay | Admitting: Neurology

## 2021-07-20 DIAGNOSIS — Z029 Encounter for administrative examinations, unspecified: Secondary | ICD-10-CM

## 2021-07-23 ENCOUNTER — Encounter (HOSPITAL_BASED_OUTPATIENT_CLINIC_OR_DEPARTMENT_OTHER): Payer: Self-pay | Admitting: Obstetrics and Gynecology

## 2021-07-23 ENCOUNTER — Other Ambulatory Visit: Payer: Self-pay

## 2021-07-23 ENCOUNTER — Emergency Department (HOSPITAL_BASED_OUTPATIENT_CLINIC_OR_DEPARTMENT_OTHER): Payer: 59

## 2021-07-23 ENCOUNTER — Telehealth: Payer: 59 | Admitting: Physician Assistant

## 2021-07-23 ENCOUNTER — Other Ambulatory Visit (HOSPITAL_BASED_OUTPATIENT_CLINIC_OR_DEPARTMENT_OTHER): Payer: Self-pay

## 2021-07-23 ENCOUNTER — Emergency Department (HOSPITAL_BASED_OUTPATIENT_CLINIC_OR_DEPARTMENT_OTHER)
Admission: EM | Admit: 2021-07-23 | Discharge: 2021-07-23 | Disposition: A | Discharge status: Home / Self Care | Payer: 59 | Attending: Emergency Medicine | Admitting: Emergency Medicine

## 2021-07-23 DIAGNOSIS — R001 Bradycardia, unspecified: Secondary | ICD-10-CM

## 2021-07-23 DIAGNOSIS — Z853 Personal history of malignant neoplasm of breast: Secondary | ICD-10-CM | POA: Insufficient documentation

## 2021-07-23 DIAGNOSIS — R519 Headache, unspecified: Secondary | ICD-10-CM | POA: Diagnosis present

## 2021-07-23 DIAGNOSIS — G43901 Migraine, unspecified, not intractable, with status migrainosus: Secondary | ICD-10-CM | POA: Insufficient documentation

## 2021-07-23 DIAGNOSIS — R030 Elevated blood-pressure reading, without diagnosis of hypertension: Secondary | ICD-10-CM

## 2021-07-23 LAB — CBC
HCT: 42.2 % (ref 36.0–46.0)
Hemoglobin: 14.5 g/dL (ref 12.0–15.0)
MCH: 31.9 pg (ref 26.0–34.0)
MCHC: 34.4 g/dL (ref 30.0–36.0)
MCV: 92.7 fL (ref 80.0–100.0)
Platelets: 215 10*3/uL (ref 150–400)
RBC: 4.55 MIL/uL (ref 3.87–5.11)
RDW: 12.3 % (ref 11.5–15.5)
WBC: 8.3 10*3/uL (ref 4.0–10.5)
nRBC: 0 % (ref 0.0–0.2)

## 2021-07-23 LAB — BASIC METABOLIC PANEL WITH GFR
Anion gap: 10 (ref 5–15)
BUN: 10 mg/dL (ref 6–20)
CO2: 25 mmol/L (ref 22–32)
Calcium: 9.8 mg/dL (ref 8.9–10.3)
Chloride: 104 mmol/L (ref 98–111)
Creatinine, Ser: 0.59 mg/dL (ref 0.44–1.00)
GFR, Estimated: >60 mL/min
Glucose, Bld: 119 mg/dL — ABNORMAL HIGH (ref 70–99)
Potassium: 3.5 mmol/L (ref 3.5–5.1)
Sodium: 139 mmol/L (ref 135–145)

## 2021-07-23 MED ORDER — SODIUM CHLORIDE 0.9 % IV BOLUS
500.0000 mL | Freq: Once | INTRAVENOUS | Status: AC
  Administered 2021-07-23: 500 mL via INTRAVENOUS

## 2021-07-23 MED ORDER — ACETAMINOPHEN 500 MG PO TABS
500.0000 mg | ORAL_TABLET | Freq: Four times a day (QID) | ORAL | 0 refills | Status: DC | PRN
  Filled 2021-07-23: qty 20, 5d supply, fill #0

## 2021-07-23 MED ORDER — METOCLOPRAMIDE HCL 5 MG/ML IJ SOLN
10.0000 mg | Freq: Once | INTRAMUSCULAR | Status: AC
  Administered 2021-07-23: 10 mg via INTRAMUSCULAR
  Filled 2021-07-23: qty 2

## 2021-07-23 MED ORDER — PROCHLORPERAZINE EDISYLATE 10 MG/2ML IJ SOLN
10.0000 mg | Freq: Once | INTRAMUSCULAR | Status: AC
  Administered 2021-07-23: 10 mg via INTRAVENOUS
  Filled 2021-07-23: qty 2

## 2021-07-23 MED ORDER — NAPROXEN 250 MG PO TABS
375.0000 mg | ORAL_TABLET | Freq: Once | ORAL | Status: AC
  Administered 2021-07-23: 375 mg via ORAL
  Filled 2021-07-23: qty 2

## 2021-07-23 MED ORDER — IBUPROFEN 400 MG PO TABS
400.0000 mg | ORAL_TABLET | Freq: Four times a day (QID) | ORAL | 0 refills | Status: AC | PRN
  Filled 2021-07-23: qty 20, 5d supply, fill #0

## 2021-07-23 MED ORDER — DIPHENHYDRAMINE HCL 50 MG/ML IJ SOLN
25.0000 mg | Freq: Once | INTRAMUSCULAR | Status: AC
  Administered 2021-07-23: 25 mg via INTRAVENOUS
  Filled 2021-07-23: qty 1

## 2021-07-23 NOTE — Progress Notes (Signed)
Virtual Visit Consent   Shelly Green, you are scheduled for a virtual visit with a [facility] provider today. Just as with appointments in the office, your consent must be obtained to participate. Your consent will be active for this visit and any virtual visit you may have with one of our providers in the next 365 days. If you have a MyChart account, a copy of this consent can be sent to you electronically.  As this is a virtual visit, video technology does not allow for your provider to perform a traditional examination. This may limit your provider's ability to fully assess your condition. If your provider identifies any concerns that need to be evaluated in person or the need to arrange testing (such as labs, EKG, etc.), we will make arrangements to do so. Although advances in technology are sophisticated, we cannot ensure that it will always work on either your end or our end. If the connection with a video visit is poor, the visit may have to be switched to a telephone visit. With either a video or telephone visit, we are not always able to ensure that we have a secure connection.  By engaging in this virtual visit, you consent to the provision of healthcare and authorize for your insurance to be billed (if applicable) for the services provided during this visit. Depending on your insurance coverage, you may receive a charge related to this service.  I need to obtain your verbal consent now. Are you willing to proceed with your visit today? Shelly Green has provided verbal consent on 07/23/2021 for a virtual visit (video or telephone). Mar Daring, PA-C  Date: 07/23/2021 9:16 AM  Virtual Visit via Video Note   I, Mar Daring, connected with  Shelly Green  ([id number], 21-Oct-1960) on 07/23/21 at  8:45 AM EDT by a video-enabled telemedicine application and verified that I am speaking with the correct person using two identifiers.  Location: Patient: Virtual Visit  Location Patient: Home Provider: Virtual Visit Location Provider: Home Office   I discussed the limitations of evaluation and management by telemedicine and the availability of in person appointments. The patient expressed understanding and agreed to proceed.    History of Present Illness: Shelly Green is a 61 y.o. who identifies as a female who was assigned female at birth, and is being seen today for migraine x 3 days.  HPI: Migraine  This is a new problem. The current episode started in the past 7 days (3 days; starts in the afternoon). The problem occurs daily. The problem has been gradually worsening. The pain does not radiate. The pain quality is not similar to prior headaches. The quality of the pain is described as aching and sharp. Associated symptoms include nausea and vomiting. Pertinent negatives include no blurred vision, dizziness, ear pain, eye pain or eye redness.    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   Anxiety disorder 02/10/2019   Nonallopathic lesion of sacral region 01/19/2019   Nonallopathic lesion of lumbosacral region  01/19/2019   Nonallopathic lesion of thoracic region 01/19/2019   Well adult exam 12/29/2018   Ganglion cyst of joint of finger of right hand 12/15/2018   Aromatase inhibitor use 12/08/2018   Hand pain, right 12/03/2018   ADD (attention deficit disorder) 12/03/2018   Bilateral carpal tunnel syndrome 06/24/2018   Depression 11/20/2017   History of breast cancer 07/19/2017    History of mastectomy 07/19/2017   Urinary incontinence 03/07/2017   GERD (gastroesophageal reflux disease) 01/29/2016   Abscess of right leg 12/25/2015   Breast cancer ([place]) 12/25/2015   Rash 12/25/2015   Hypokalemia 12/25/2015   Cellulitis 12/25/2015   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[place]) 09/20/2015   Breast lump in female 08/23/2015   Cough 08/23/2015   Migraine headache with aura 04/05/2015   IBS (irritable bowel syndrome) 11/15/2014   Acute blood loss anemia 09/30/2014   Spleen laceration 09/28/2014   Low libido 11/12/2013   Abdominal pain 09/24/2013   Chronic low back pain 09/24/2013   Tobacco use disorder 09/24/2013   Menopause syndrome 09/28/2012    Allergies:  Allergies  Allergen Reactions   Codeine Anaphylaxis   Hydrocodone Shortness Of Breath   Penicillins Anaphylaxis    As a child Has patient had a PCN reaction causing immediate rash, facial/tongue/throat swelling, SOB or lightheadedness with hypotension: yes Has patient had a PCN reaction causing severe rash involving mucus membranes or skin necrosis: unknown Has patient had a PCN reaction that required hospitalization: yes Has patient had a PCN reaction occurring within the last 10 years: no If all of the above answers are "NO", then may proceed with Cephalosporin use.    Sulfa Antibiotics Anaphylaxis and Rash   Tramadol Shortness Of Breath   Clindamycin/Lincomycin     rush   Wellbutrin [Bupropion]     HAs, anger   Medications:  Current Outpatient Medications:    butalbital-acetaminophen-caffeine (FIORICET) 50-325-40 MG tablet, TAKE 2 TABLETS BY MOUTH EVERY 6 (SIX) HOURS AS NEEDED., Disp: 40 tablet, Rfl: 1   Cholecalciferol (VITAMIN D3) 50 MCG (2000 UT) capsule, Take 1 capsule (2,000 Units total) by mouth daily., Disp: 100 capsule, Rfl: 3   methylphenidate (METADATE ER) 20 MG ER tablet, Take 1 tablet (20 mg total) by mouth daily., Disp: 30 tablet,  Rfl: 0   methylphenidate (METADATE ER) 20 MG ER tablet, Take 1 tablet (20 mg total) by mouth daily., Disp: 30 tablet, Rfl: 0   methylphenidate (METADATE ER) 20 MG ER tablet, Take 1 tablet (20 mg total) by mouth daily., Disp: 30 tablet, Rfl: 0   nicotine (NICODERM CQ - DOSED IN MG/24 HR) 7 mg/24hr patch, 7 mg daily., Disp: , Rfl:    rizatriptan (MAXALT-MLT) 10 MG disintegrating tablet, TAKE 1 TABLET EARLIEST ONSET OF MIGRAINE. MAY REPEAT IN 2 HOURS IF NEEDED. MAXIMUM 2 TABLETS IN 24 HOURS, Disp: 10 tablet, Rfl: 5   SUMAtriptan (IMITREX) 100 MG tablet, TAKE 1 TAB BY MOUTH DAILY AS NEEDED FOR MIGRAINE MAY REPEAT IN 2HOURS IF HEADACHE PERSISTS OR RECURS, Disp: 9 tablet, Rfl: 5  Observations/Objective: Patient is well-developed, well-nourished in no acute distress.  Resting comfortably at home.  Head is normocephalic, atraumatic.  No labored breathing.  Speech is clear and coherent with logical content.  Patient is alert and oriented at baseline.    Assessment and Plan: 1. Acute intractable headache, unspecified headache type  2. Elevated blood pressure reading  3. Bradycardia  - Advised patient she should  seek in person evaluation for new elevated blood pressure with headache and nausea - Also in reviewing chart, blood pressure has been more elevated this year, but of note had HR in the 70s-90s, but last 2 visits has had HR in 50s, advised she needs EKG to evaluate HR change - She voiced understanding and will proceed to in person evaluation at PCP or local ER  Follow Up Instructions: I discussed the assessment and treatment plan with the patient. The patient was provided an opportunity to ask questions and all were answered. The patient agreed with the plan and demonstrated an understanding of the instructions.  A copy of instructions were sent to the patient via MyChart unless otherwise noted below.    The patient was advised to call back or seek an in-person evaluation if the symptoms  worsen or if the condition fails to improve as anticipated.  Time:  I spent 15 minutes with the patient via telehealth technology discussing the above problems/concerns.    Mar Daring, PA-C

## 2021-07-23 NOTE — Patient Instructions (Signed)
  Shelly Green, thank you for joining Mar Daring, PA-C for today's virtual visit.  While this provider is not your primary care provider (PCP), if your PCP is located in our provider database this encounter information will be shared with them immediately following your visit.  Consent: (Patient) Shelly Green provided verbal consent for this virtual visit at the beginning of the encounter.  Current Medications:  Current Outpatient Medications:    butalbital-acetaminophen-caffeine (FIORICET) 50-325-40 MG tablet, TAKE 2 TABLETS BY MOUTH EVERY 6 (SIX) HOURS AS NEEDED., Disp: 40 tablet, Rfl: 1   Cholecalciferol (VITAMIN D3) 50 MCG (2000 UT) capsule, Take 1 capsule (2,000 Units total) by mouth daily., Disp: 100 capsule, Rfl: 3   methylphenidate (METADATE ER) 20 MG ER tablet, Take 1 tablet (20 mg total) by mouth daily., Disp: 30 tablet, Rfl: 0   methylphenidate (METADATE ER) 20 MG ER tablet, Take 1 tablet (20 mg total) by mouth daily., Disp: 30 tablet, Rfl: 0   methylphenidate (METADATE ER) 20 MG ER tablet, Take 1 tablet (20 mg total) by mouth daily., Disp: 30 tablet, Rfl: 0   nicotine (NICODERM CQ - DOSED IN MG/24 HR) 7 mg/24hr patch, 7 mg daily., Disp: , Rfl:    rizatriptan (MAXALT-MLT) 10 MG disintegrating tablet, TAKE 1 TABLET EARLIEST ONSET OF MIGRAINE. MAY REPEAT IN 2 HOURS IF NEEDED. MAXIMUM 2 TABLETS IN 24 HOURS, Disp: 10 tablet, Rfl: 5   SUMAtriptan (IMITREX) 100 MG tablet, TAKE 1 TAB BY MOUTH DAILY AS NEEDED FOR MIGRAINE MAY REPEAT IN 2HOURS IF HEADACHE PERSISTS OR RECURS, Disp: 9 tablet, Rfl: 5   Medications ordered in this encounter:  No orders of the defined types were placed in this encounter.    *If you need refills on other medications prior to your next appointment, please contact your pharmacy*  Follow-Up: Call back or seek an in-person evaluation if the symptoms worsen or if the condition fails to improve as anticipated.   If you have been instructed to have  an in-person evaluation today at a local Urgent Care facility, please use the link below. It will take you to a list of all of our available [facility], including address, phone number and hours of operation. Please do not delay care.  [facility]  If you or a family member do not have a primary care provider, use the link below to schedule a visit and establish care. When you choose a [facility] primary care physician or advanced practice provider, you gain a long-term partner in health. Find a Primary Care Provider  Learn more about [facility]'s in-office and virtual care options: [facility]

## 2021-07-23 NOTE — ED Triage Notes (Signed)
Patient reports to the ER for headaches and hypertension. Patient reports she did a virtual visit with her provider as she has had this headache for 48 hours with no relief with her usual headache medication and was told to come to the ER for evaluation.

## 2021-07-23 NOTE — Discharge Instructions (Addendum)
Please follow-up with your neurologist for optimal management of your headaches. Until seen by neurologist, in addition to your headache medication, consider taking Tylenol or Motrin every 6 hours for optimal pain control.  Please return to the ER if the headache gets severe and in not improving, you have associated new one sided numbness, tingling, weakness or confusion, seizures, poor balance or poor vision.

## 2021-07-23 NOTE — ED Notes (Signed)
Dc instructions reviewed with patient. Patient voiced understanding. Dc with belongings.  °

## 2021-07-23 NOTE — ED Provider Notes (Signed)
[facility] EMERGENCY DEPT Provider Note   CSN: [id number] Arrival date & time: 07/23/21  1002     History  Chief Complaint  Patient presents with   Headache   Hypertension    Shelly Green is a 61 y.o. female.  HPI     61 year old patient comes in with chief complaint of headache. Patient has history of migraine, complex headache, remote history of breast cancer. She states that she started having this current headache about 10 days ago.  Headaches have been present every day since, usually they are noticeable in the afternoon.  She has been taking as needed rizatriptan, but does not think they are helping.  She does get headaches of similar nature normally, but they are infrequent.  2 days ago, she started noticing that the headaches were more severe and she started having some nausea and vomiting.  Same thing continued yesterday, prompting her to come to the ER.  Patient describes the headaches as frontal, sharp and there is associated photophobia.  Occasionally she will notice some pain over her neck area as well.  Pt has no associated seizures, loss of consciousness or new visual complains, weakness, numbness, dizziness or gait instability.  Patient used to smoke heavily, but has quit about 6 months ago.  Headaches are typically worse with her being active and walking.  Headaches are usually not worse when laying flat or at night /forcing in the morning.  Home Medications Prior to Admission medications   Medication Sig Start Date End Date Taking? Authorizing Provider  acetaminophen (TYLENOL) 500 MG tablet Take 1 tablet (500 mg total) by mouth every 6 (six) hours as needed. 07/23/21  Yes Varney Biles, MD  ibuprofen (ADVIL) 400 MG tablet Take 1 tablet (400 mg total) by mouth every 6 (six) hours as needed. 07/23/21  Yes Varney Biles, MD  Cholecalciferol (VITAMIN D3) 50 MCG (2000 UT) capsule Take 1 capsule (2,000 Units total) by mouth daily. 12/29/18   Plotnikov,  Evie Lacks, MD  methylphenidate (METADATE ER) 20 MG ER tablet Take 1 tablet (20 mg total) by mouth daily. 07/02/21   Plotnikov, Evie Lacks, MD  methylphenidate (METADATE ER) 20 MG ER tablet Take 1 tablet (20 mg total) by mouth daily. 07/02/21   Plotnikov, Evie Lacks, MD  methylphenidate (METADATE ER) 20 MG ER tablet Take 1 tablet (20 mg total) by mouth daily. 07/02/21   Plotnikov, Evie Lacks, MD  nicotine (NICODERM CQ - DOSED IN MG/24 HR) 7 mg/24hr patch 7 mg daily. 06/05/21   [provider]  rizatriptan (MAXALT-MLT) 10 MG disintegrating tablet TAKE 1 TABLET EARLIEST ONSET OF MIGRAINE. MAY REPEAT IN 2 HOURS IF NEEDED. MAXIMUM 2 TABLETS IN 24 HOURS 07/20/21   Tomi Likens, Adam R, DO  SUMAtriptan (IMITREX) 100 MG tablet TAKE 1 TAB BY MOUTH DAILY AS NEEDED FOR MIGRAINE MAY REPEAT IN 2HOURS IF HEADACHE PERSISTS OR RECURS 01/05/21   Plotnikov, Evie Lacks, MD      Allergies    Codeine, Hydrocodone, Penicillins, Sulfa antibiotics, Tramadol, Clindamycin/lincomycin, and Wellbutrin [bupropion]    Review of Systems   Review of Systems  All other systems reviewed and are negative.   Physical Exam Updated Vital Signs BP (!) 163/108   Pulse 72   Temp 98 F (36.7 C)   Resp 16   SpO2 100%  Physical Exam Vitals and nursing note reviewed.  Constitutional:      Appearance: She is well-developed.  HENT:     Head: Atraumatic.  Eyes:  General: No visual field deficit.    Extraocular Movements: Extraocular movements intact.     Pupils: Pupils are equal, round, and reactive to light.     Comments: No nystagmus, normal visual field exam and normal peripheral vision on bedside finger counting  Neck:     Comments: No meningismus Cardiovascular:     Rate and Rhythm: Normal rate.  Pulmonary:     Effort: Pulmonary effort is normal.  Musculoskeletal:     Cervical back: Normal range of motion and neck supple.  Skin:    General: Skin is warm and dry.  Neurological:     Mental Status: She is alert and  oriented to person, place, and time.     GCS: GCS eye subscore is 4. GCS verbal subscore is 5. GCS motor subscore is 6.     Cranial Nerves: No cranial nerve deficit, dysarthria or facial asymmetry.     ED Results / Procedures / Treatments   Labs (all labs ordered are listed, but only abnormal results are displayed) Labs Reviewed  BASIC METABOLIC PANEL - Abnormal; Notable for the following components:      Result Value   Glucose, Bld 119 (*)    All other components within normal limits  CBC    EKG None  Radiology CT HEAD WO CONTRAST  Result Date: 07/23/2021 CLINICAL DATA:  Headache, new or worsening (Age >= 50y) EXAM: CT HEAD WITHOUT CONTRAST TECHNIQUE: Contiguous axial images were obtained from the base of the skull through the vertex without intravenous contrast. RADIATION DOSE REDUCTION: This exam was performed according to the departmental dose-optimization program which includes automated exposure control, adjustment of the mA and/or kV according to patient size and/or use of iterative reconstruction technique. COMPARISON:  None Available. FINDINGS: Brain: There is no acute intracranial hemorrhage, mass effect, or edema. Gray-white differentiation is preserved. There is no extra-axial fluid collection. Ventricles and sulci are within normal limits in size and configuration. Patchy hypoattenuation in the supratentorial white matter is nonspecific but may reflect mild chronic microvascular ischemic changes. Vascular: No hyperdense vessel. Skull: Calvarium is unremarkable. Sinuses/Orbits: No acute finding. Other: None. IMPRESSION: No acute intracranial abnormality. Probable mild chronic microvascular ischemic changes. Electronically Signed   By: Macy Mis M.D.   On: 07/23/2021 10:48    Procedures Procedures    Medications Ordered in ED Medications  naproxen (NAPROSYN) tablet 375 mg (has no administration in time range)  metoCLOPramide (REGLAN) injection 10 mg (has no  administration in time range)  prochlorperazine (COMPAZINE) injection 10 mg (10 mg Intravenous Given 07/23/21 1219)  diphenhydrAMINE (BENADRYL) injection 25 mg (25 mg Intravenous Given 07/23/21 1219)  sodium chloride 0.9 % bolus 500 mL (500 mLs Intravenous New Bag/Given 07/23/21 1210)    ED Course/ Medical Decision Making/ A&P Clinical Course as of 07/23/21 1332  Mon Jul 23, 2021  1332 Upon reassessment, patient reports that the headache has resolved. She continued to have no neurologic complains. Strict return precautions discussed, pt will return to the ER if there is visual complains, seizures, altered mental status, loss of consciousness, dizziness, new focal weakness, or numbness.    [AN]    Clinical Course User Index [AN] Varney Biles, MD                           Medical Decision Making Amount and/or Complexity of Data Reviewed Labs: ordered. Radiology: ordered.  Risk OTC drugs. Prescription drug management.   This patient presents to  the ED with chief complaint(s) of headache with pertinent past medical history of remote history of breast cancer, in remission and previous history of migraine which further complicates the presenting complaint. The complaint involves an extensive differential diagnosis and also carries with it a high risk of complications and morbidity.    The differential diagnosis includes  Complex headaches, status migrainosus, metastatic lesion, intracranial hemorrhage, venous thrombosis.  Patient does not have any neck pain, meningismus right now.  No neurodeficits.  Vertebral dissection less likely.  The initial plan is to focus on pain control.  Patient had basic labs and CT scan of the brain ordered in triage, CT scan has been visualized and interpreted independently.  It is negative for brain bleed.  CT scan was appropriately ordered.  If patient is not responding to the headache cocktail in the ED, then we will give her more pain medication and  consider CT venogram.   Additional history obtained: Records reviewed Primary Care Documents and heme-onc documentation  Independent labs interpretation:  The following labs were independently interpreted: CBC and metabolic profile with reassuring lab values.  Independent visualization of imaging: - I independently visualized the following imaging with scope of interpretation limited to determining acute life threatening conditions related to emergency care: CT scan of the brain, which revealed no evidence of acute brain bleed  Treatment and Reassessment: Patient reassessed at 1:15 PM.  She received IV pain medication and is feeling a lot better.  Headache was at 8 out of 10.  Now the headache is at most 1 or 2 out of 10.  She was able to sit up and have no worsening of her symptoms and the light is not bothering her as much.  At this time, plan is for her to follow-up with her neurologist as soon as possible.  She will take as needed Tylenol and ibuprofen. Strict ER return precautions discussed with the patient.   Final Clinical Impression(s) / ED Diagnoses Final diagnoses:  Status migrainosus    Rx / DC Orders ED Discharge Orders          Ordered    acetaminophen (TYLENOL) 500 MG tablet  Every 6 hours PRN        07/23/21 1331    ibuprofen (ADVIL) 400 MG tablet  Every 6 hours PRN        07/23/21 [place], Ovella Manygoats, MD 07/23/21 1334

## 2021-07-31 ENCOUNTER — Ambulatory Visit: Payer: 59 | Admitting: Internal Medicine

## 2021-07-31 ENCOUNTER — Encounter: Payer: Self-pay | Admitting: Internal Medicine

## 2021-07-31 DIAGNOSIS — G43109 Migraine with aura, not intractable, without status migrainosus: Secondary | ICD-10-CM

## 2021-07-31 DIAGNOSIS — F339 Major depressive disorder, recurrent, unspecified: Secondary | ICD-10-CM | POA: Diagnosis not present

## 2021-07-31 DIAGNOSIS — I10 Essential (primary) hypertension: Secondary | ICD-10-CM | POA: Diagnosis not present

## 2021-07-31 DIAGNOSIS — F39 Unspecified mood [affective] disorder: Secondary | ICD-10-CM

## 2021-07-31 HISTORY — DX: Essential (primary) hypertension: I10

## 2021-07-31 MED ORDER — ONDANSETRON HCL 4 MG PO TABS: 4.0000 mg | ORAL_TABLET | Freq: Three times a day (TID) | ORAL | 1 refills | Status: DC | PRN

## 2021-07-31 MED ORDER — SUMATRIPTAN SUCCINATE 100 MG PO TABS
ORAL_TABLET | ORAL | 5 refills | Status: DC
Start: 2021-07-31 — End: 2021-12-21

## 2021-07-31 MED ORDER — PROPRANOLOL HCL 20 MG PO TABS: 20.0000 mg | ORAL_TABLET | Freq: Two times a day (BID) | ORAL | 11 refills | Status: DC

## 2021-07-31 NOTE — Assessment & Plan Note (Signed)
Worse Start Propranolol bid Use Imitrex/Zofran prn Neck massage, contour pillow

## 2021-07-31 NOTE — Assessment & Plan Note (Signed)
Mood disorder - better on Vraylar - not covered Pt will ask her pharmacist what preferred brand is

## 2021-07-31 NOTE — Patient Instructions (Signed)
Sign up for [employer] library ( via [employer] app on your phone or your ipad). If you don't have a library card  - go to any library branch. They will set you up in 15 minutes. It is free. You can check out books to read and to listen, check out magazines and newspapers, movies etc.   

## 2021-07-31 NOTE — Assessment & Plan Note (Signed)
New Start Propranolol bid

## 2021-07-31 NOTE — Progress Notes (Signed)
Subjective:  Patient ID: Shelly Green, female    DOB: [birth year]  Age: 61 y.o. MRN: [id number]  CC: No chief complaint on file.   HPI Shelly Green presents for HAs in PMs C/o elevated BP in pm Vraylar is helping but not covered ($$$) H/o migraines  Outpatient Medications Prior to Visit  Medication Sig Dispense Refill   acetaminophen (TYLENOL) 500 MG tablet Take 1 tablet (500 mg total) by mouth every 6 (six) hours as needed. 20 tablet 0   Cholecalciferol (VITAMIN D3) 50 MCG (2000 UT) capsule Take 1 capsule (2,000 Units total) by mouth daily. 100 capsule 3   ibuprofen (ADVIL) 400 MG tablet Take 1 tablet (400 mg total) by mouth every 6 (six) hours as needed. 20 tablet 0   methylphenidate (METADATE ER) 20 MG ER tablet Take 1 tablet (20 mg total) by mouth daily. 30 tablet 0   methylphenidate (METADATE ER) 20 MG ER tablet Take 1 tablet (20 mg total) by mouth daily. 30 tablet 0   methylphenidate (METADATE ER) 20 MG ER tablet Take 1 tablet (20 mg total) by mouth daily. 30 tablet 0   nicotine (NICODERM CQ - DOSED IN MG/24 HR) 7 mg/24hr patch 7 mg daily.     rizatriptan (MAXALT-MLT) 10 MG disintegrating tablet TAKE 1 TABLET EARLIEST ONSET OF MIGRAINE. MAY REPEAT IN 2 HOURS IF NEEDED. MAXIMUM 2 TABLETS IN 24 HOURS 10 tablet 5   SUMAtriptan (IMITREX) 100 MG tablet TAKE 1 TAB BY MOUTH DAILY AS NEEDED FOR MIGRAINE MAY REPEAT IN 2HOURS IF HEADACHE PERSISTS OR RECURS 9 tablet 5   No facility-administered medications prior to visit.    ROS: Review of Systems  Constitutional:  Positive for fatigue. Negative for activity change, appetite change, chills and unexpected weight change.  HENT:  Negative for congestion, mouth sores and sinus pressure.   Eyes:  Negative for visual disturbance.  Respiratory:  Negative for cough and chest tightness.   Gastrointestinal:  Negative for abdominal pain and nausea.  Genitourinary:  Negative for difficulty urinating, frequency and vaginal pain.   Musculoskeletal:  Positive for neck pain. Negative for back pain and gait problem.  Skin:  Negative for pallor and rash.  Neurological:  Positive for headaches. Negative for dizziness, tremors, weakness and numbness.  Psychiatric/Behavioral:  Negative for confusion and sleep disturbance.     Objective:  BP (!) 140/100 (BP Location: Left Arm, Patient Position: Sitting, Cuff Size: Normal)   Pulse 81   Temp 99 F (37.2 C) (Oral)   Ht '5\' 2"'$  (1.575 m)   Wt 124 lb (56.2 kg)   SpO2 95%   BMI 22.68 kg/m   BP Readings from Last 3 Encounters:  07/31/21 (!) 140/100  07/23/21 (!) 141/111  07/19/21 122/70    Wt Readings from Last 3 Encounters:  07/31/21 124 lb (56.2 kg)  07/19/21 125 lb 6.4 oz (56.9 kg)  07/02/21 123 lb (55.8 kg)    Physical Exam Constitutional:      General: She is not in acute distress.    Appearance: Normal appearance. She is well-developed.  HENT:     Head: Normocephalic.     Right Ear: External ear normal.     Left Ear: External ear normal.     Nose: Nose normal.  Eyes:     General:        Right eye: No discharge.        Left eye: No discharge.     Conjunctiva/sclera: Conjunctivae normal.  Pupils: Pupils are equal, round, and reactive to light.  Neck:     Thyroid: No thyromegaly.     Vascular: No JVD.     Trachea: No tracheal deviation.  Cardiovascular:     Rate and Rhythm: Normal rate and regular rhythm.     Heart sounds: Normal heart sounds.  Pulmonary:     Effort: No respiratory distress.     Breath sounds: No stridor. No wheezing.  Abdominal:     General: Bowel sounds are normal. There is no distension.     Palpations: Abdomen is soft. There is no mass.     Tenderness: There is no abdominal tenderness. There is no guarding or rebound.  Musculoskeletal:        General: No tenderness.     Cervical back: Normal range of motion and neck supple. No rigidity.  Lymphadenopathy:     Cervical: No cervical adenopathy.  Skin:    Findings: No  erythema or rash.  Neurological:     Mental Status: She is oriented to person, place, and time.     Cranial Nerves: No cranial nerve deficit.     Motor: No abnormal muscle tone.     Coordination: Coordination normal.     Deep Tendon Reflexes: Reflexes normal.  Psychiatric:        Behavior: Behavior normal.        Thought Content: Thought content normal.        Judgment: Judgment normal.   Neck - tender w/ROM  Lab Results  Component Value Date   WBC 8.3 07/23/2021   HGB 14.5 07/23/2021   HCT 42.2 07/23/2021   PLT 215 07/23/2021   GLUCOSE 119 (H) 07/23/2021   CHOL 145 12/29/2018   TRIG 62.0 12/29/2018   HDL 47.70 12/29/2018   LDLCALC 85 12/29/2018   ALT 14 05/15/2020   AST 16 05/15/2020   NA 139 07/23/2021   K 3.5 07/23/2021   CL 104 07/23/2021   CREATININE 0.59 07/23/2021   BUN 10 07/23/2021   CO2 25 07/23/2021   TSH 2.64 12/29/2018   INR 1.02 09/28/2014    CT HEAD WO CONTRAST  Result Date: 07/23/2021 CLINICAL DATA:  Headache, new or worsening (Age >= 50y) EXAM: CT HEAD WITHOUT CONTRAST TECHNIQUE: Contiguous axial images were obtained from the base of the skull through the vertex without intravenous contrast. RADIATION DOSE REDUCTION: This exam was performed according to the departmental dose-optimization program which includes automated exposure control, adjustment of the mA and/or kV according to patient size and/or use of iterative reconstruction technique. COMPARISON:  None Available. FINDINGS: Brain: There is no acute intracranial hemorrhage, mass effect, or edema. Gray-white differentiation is preserved. There is no extra-axial fluid collection. Ventricles and sulci are within normal limits in size and configuration. Patchy hypoattenuation in the supratentorial white matter is nonspecific but may reflect mild chronic microvascular ischemic changes. Vascular: No hyperdense vessel. Skull: Calvarium is unremarkable. Sinuses/Orbits: No acute finding. Other: None. IMPRESSION: No  acute intracranial abnormality. Probable mild chronic microvascular ischemic changes. Electronically Signed   By: Macy Mis M.D.   On: 07/23/2021 10:48    Assessment & Plan:   Problem List Items Addressed This Visit     Depression   HTN (hypertension)    New Start Propranolol bid       Relevant Medications   propranolol (INDERAL) 20 MG tablet   Migraine headache with aura    Worse Start Propranolol bid Use Imitrex/Zofran prn Neck massage, contour pillow  Relevant Medications   SUMAtriptan (IMITREX) 100 MG tablet   propranolol (INDERAL) 20 MG tablet   Mood disorder (HCC)    Mood disorder - better on Vraylar - not covered Pt will ask her pharmacist what preferred brand is         Meds ordered this encounter  Medications   SUMAtriptan (IMITREX) 100 MG tablet    Sig: TAKE 1 TAB BY MOUTH DAILY AS NEEDED FOR MIGRAINE MAY REPEAT IN 2HOURS IF HEADACHE PERSISTS OR RECURS    Dispense:  12 tablet    Refill:  5   ondansetron (ZOFRAN) 4 MG tablet    Sig: Take 1 tablet (4 mg total) by mouth every 8 (eight) hours as needed for nausea or vomiting.    Dispense:  20 tablet    Refill:  1   propranolol (INDERAL) 20 MG tablet    Sig: Take 1 tablet (20 mg total) by mouth 2 (two) times daily.    Dispense:  60 tablet    Refill:  11      Follow-up: Return in about 6 weeks (around 09/11/2021) for a follow-up visit.  Walker Kehr, MD

## 2021-08-03 ENCOUNTER — Encounter: Payer: Self-pay | Admitting: Internal Medicine

## 2021-08-06 MED ORDER — CARIPRAZINE HCL 1.5 MG PO CAPS: 1.5000 mg | ORAL_CAPSULE | Freq: Every day | ORAL | 3 refills | Status: DC

## 2021-08-06 NOTE — Telephone Encounter (Signed)
Submitted  PA for [employer] w/ (Key: [id number]). Rec'd instant msg that med was APPROVED. Effective  08/06/2021 to 08/06/2022. Faxing approval to pof & notified pt../l;mb

## 2021-08-09 ENCOUNTER — Other Ambulatory Visit: Payer: Self-pay

## 2021-08-09 ENCOUNTER — Emergency Department (HOSPITAL_BASED_OUTPATIENT_CLINIC_OR_DEPARTMENT_OTHER)
Admission: EM | Admit: 2021-08-09 | Discharge: 2021-08-09 | Disposition: A | Discharge status: Home / Self Care | Payer: 59 | Attending: Emergency Medicine | Admitting: Emergency Medicine

## 2021-08-09 ENCOUNTER — Encounter (HOSPITAL_BASED_OUTPATIENT_CLINIC_OR_DEPARTMENT_OTHER): Payer: Self-pay | Admitting: Emergency Medicine

## 2021-08-09 ENCOUNTER — Emergency Department (HOSPITAL_BASED_OUTPATIENT_CLINIC_OR_DEPARTMENT_OTHER): Payer: 59 | Admitting: Radiology

## 2021-08-09 DIAGNOSIS — S92254A Nondisplaced fracture of navicular [scaphoid] of right foot, initial encounter for closed fracture: Secondary | ICD-10-CM | POA: Diagnosis not present

## 2021-08-09 DIAGNOSIS — W1842XA Slipping, tripping and stumbling without falling due to stepping into hole or opening, initial encounter: Secondary | ICD-10-CM | POA: Diagnosis not present

## 2021-08-09 DIAGNOSIS — S99921A Unspecified injury of right foot, initial encounter: Secondary | ICD-10-CM | POA: Diagnosis present

## 2021-08-09 NOTE — ED Provider Notes (Signed)
[facility] EMERGENCY DEPT Provider Note   CSN: [id number] Arrival date & time: 08/09/21  1426     History  Chief Complaint  Patient presents with   Ankle Injury    Shelly Green is a 61 y.o. female.  Pt complains of pain in her foot.    The history is provided by the patient. No language interpreter was used.  Ankle Injury This is a new problem. The problem occurs constantly. The problem has not changed since onset.Nothing aggravates the symptoms. Nothing relieves the symptoms. She has tried nothing for the symptoms. The treatment provided no relief.       Home Medications Prior to Admission medications   Medication Sig Start Date End Date Taking? Authorizing Provider  acetaminophen (TYLENOL) 500 MG tablet Take 1 tablet (500 mg total) by mouth every 6 (six) hours as needed. 07/23/21   Varney Biles, MD  cariprazine (VRAYLAR) 1.5 MG capsule Take 1 capsule (1.5 mg total) by mouth daily. 08/06/21   Plotnikov, Evie Lacks, MD  Cholecalciferol (VITAMIN D3) 50 MCG (2000 UT) capsule Take 1 capsule (2,000 Units total) by mouth daily. 12/29/18   Plotnikov, Evie Lacks, MD  ibuprofen (ADVIL) 400 MG tablet Take 1 tablet (400 mg total) by mouth every 6 (six) hours as needed. 07/23/21   Varney Biles, MD  methylphenidate (METADATE ER) 20 MG ER tablet Take 1 tablet (20 mg total) by mouth daily. 07/02/21   Plotnikov, Evie Lacks, MD  methylphenidate (METADATE ER) 20 MG ER tablet Take 1 tablet (20 mg total) by mouth daily. 07/02/21   Plotnikov, Evie Lacks, MD  methylphenidate (METADATE ER) 20 MG ER tablet Take 1 tablet (20 mg total) by mouth daily. 07/02/21   Plotnikov, Evie Lacks, MD  nicotine (NICODERM CQ - DOSED IN MG/24 HR) 7 mg/24hr patch 7 mg daily. 06/05/21   [provider]  ondansetron (ZOFRAN) 4 MG tablet Take 1 tablet (4 mg total) by mouth every 8 (eight) hours as needed for nausea or vomiting. 07/31/21   Plotnikov, Evie Lacks, MD  propranolol (INDERAL) 20 MG tablet Take 1  tablet (20 mg total) by mouth 2 (two) times daily. 07/31/21   Plotnikov, Evie Lacks, MD  SUMAtriptan (IMITREX) 100 MG tablet TAKE 1 TAB BY MOUTH DAILY AS NEEDED FOR MIGRAINE MAY REPEAT IN 2HOURS IF HEADACHE PERSISTS OR RECURS 07/31/21   Plotnikov, Evie Lacks, MD      Allergies    Codeine, Hydrocodone, Penicillins, Pholcodine, Sulfa antibiotics, Tramadol, Clindamycin/lincomycin, and Wellbutrin [bupropion]    Review of Systems   Review of Systems  Musculoskeletal:  Positive for joint swelling.  All other systems reviewed and are negative.   Physical Exam Updated Vital Signs BP (!) 126/99   Pulse 63   Temp 98 F (36.7 C) (Temporal)   Resp 16   Ht '5\' 2"'$  (1.575 m)   Wt 56.7 kg   SpO2 97%   BMI 22.86 kg/m  Physical Exam Vitals reviewed.  Constitutional:      Appearance: Normal appearance.  Musculoskeletal:        General: Swelling and tenderness present.     Comments: Swollen, tender right foot,  bruised,  pain with range of motion,  nv and ns intact   Skin:    General: Skin is warm.  Neurological:     General: No focal deficit present.     Mental Status: She is alert.  Psychiatric:        Mood and Affect: Mood normal.  ED Results / Procedures / Treatments   Labs (all labs ordered are listed, but only abnormal results are displayed) Labs Reviewed - No data to display  EKG None  Radiology DG Foot Complete Right  Result Date: 08/09/2021 CLINICAL DATA:  Swelling and pain, history of RIGHT ankle injury. EXAM: RIGHT FOOT COMPLETE - 3+ VIEW COMPARISON:  Ankle evaluation of the same date. FINDINGS: Abundant soft tissue swelling over the lateral aspect of the foot and ankle mainly about the hindfoot and ankle. Swelling over the dorsum of the midfoot as well. Avulsion fracture from the navicular on lateral projection. Also seen on ankle radiographs. IMPRESSION: Avulsion fracture from the navicular with extensive soft tissue swelling over the dorsum of the midfoot and lateral  aspect of the foot and ankle. Electronically Signed   By: Zetta Bills M.D.   On: 08/09/2021 15:53   DG Ankle Complete Right  Result Date: 08/09/2021 CLINICAL DATA:  Swelling and pain. Complains of right ankle injury after stepping in hole rolling ankle. EXAM: RIGHT ANKLE - COMPLETE 3+ VIEW COMPARISON:  None Available. FINDINGS: There is marked lateral and dorsal soft tissue swelling overlying the ankle. Avulsion fracture is identified along the dorsal surface of the navicular bone. No additional fracture identified. IMPRESSION: 1. Avulsion fracture arising from the dorsal surface of the navicular bone. 2. Marked lateral and dorsal soft tissue swelling. Electronically Signed   By: Kerby Moors M.D.   On: 08/09/2021 15:51    Procedures Procedures    Medications Ordered in ED Medications - No data to display  ED Course/ Medical Decision Making/ A&P                           Medical Decision Making Amount and/or Complexity of Data Reviewed Radiology: ordered.   Pt placed ina walker boot and given crutches.  Pt advised tylenol for pain Follow up with Orthopaedist for recheck.         Final Clinical Impression(s) / ED Diagnoses Final diagnoses:  Closed nondisplaced fracture of navicular bone of right foot, initial encounter    Rx / DC Orders ED Discharge Orders     None     An After Visit Summary was printed and given to the patient.     Fransico Meadow, [place] 08/09/21 2251    Tegeler, Gwenyth Allegra, MD 08/09/21 2352

## 2021-08-09 NOTE — Discharge Instructions (Addendum)
Return if any problems.  Follow up with Orthopaedist for recheck in 1 week

## 2021-08-09 NOTE — ED Triage Notes (Signed)
Pt arrives to ED with c/o right ankle injury. Pt reports that she step in a hole and rolled her ankle, hearing a snapping sound.

## 2021-08-09 NOTE — ED Notes (Signed)
Patient verbalizes understanding of discharge instructions. Opportunity for questioning and answers were provided. Patient discharged from ED.  °

## 2021-08-13 DIAGNOSIS — S92253A Displaced fracture of navicular [scaphoid] of unspecified foot, initial encounter for closed fracture: Secondary | ICD-10-CM | POA: Insufficient documentation

## 2021-08-13 DIAGNOSIS — S93401A Sprain of unspecified ligament of right ankle, initial encounter: Secondary | ICD-10-CM | POA: Insufficient documentation

## 2021-08-27 ENCOUNTER — Encounter: Payer: Self-pay | Admitting: Internal Medicine

## 2021-10-02 ENCOUNTER — Encounter: Payer: Self-pay | Admitting: Internal Medicine

## 2021-10-02 ENCOUNTER — Ambulatory Visit: Payer: 59 | Admitting: Internal Medicine

## 2021-10-02 DIAGNOSIS — F39 Unspecified mood [affective] disorder: Secondary | ICD-10-CM

## 2021-10-02 DIAGNOSIS — F988 Other specified behavioral and emotional disorders with onset usually occurring in childhood and adolescence: Secondary | ICD-10-CM | POA: Diagnosis not present

## 2021-10-02 MED ORDER — LAMOTRIGINE 25 MG PO TABS: 25.0000 mg | ORAL_TABLET | Freq: Two times a day (BID) | ORAL | 3 refills | Status: DC

## 2021-10-02 MED ORDER — HYDROXYZINE HCL 25 MG PO TABS: 25.0000 mg | ORAL_TABLET | Freq: Three times a day (TID) | ORAL | 0 refills | Status: DC | PRN

## 2021-10-02 NOTE — Progress Notes (Signed)
Subjective:  Patient ID: Shelly Green, female    DOB: Jul 20, 1960  Age: 61 y.o. MRN: [id number]  CC: Follow-up (3 month f/u)   HPI Shelly Green presents for depression, ADD Pt stopped Vraylar after 1 mo due to increased urge to smoke and to eat Dtr is using Lamotrigine and Hydroxyzine for mood disorder and is doing well  Outpatient Medications Prior to Visit  Medication Sig Dispense Refill  . acetaminophen (TYLENOL) 500 MG tablet Take 1 tablet (500 mg total) by mouth every 6 (six) hours as needed. 20 tablet 0  . Cholecalciferol (VITAMIN D3) 50 MCG (2000 UT) capsule Take 1 capsule (2,000 Units total) by mouth daily. 100 capsule 3  . ibuprofen (ADVIL) 400 MG tablet Take 1 tablet (400 mg total) by mouth every 6 (six) hours as needed. 20 tablet 0  . methylphenidate (METADATE ER) 20 MG ER tablet Take 1 tablet (20 mg total) by mouth daily. 30 tablet 0  . methylphenidate (METADATE ER) 20 MG ER tablet Take 1 tablet (20 mg total) by mouth daily. 30 tablet 0  . methylphenidate (METADATE ER) 20 MG ER tablet Take 1 tablet (20 mg total) by mouth daily. 30 tablet 0  . nicotine (NICODERM CQ - DOSED IN MG/24 HR) 7 mg/24hr patch 7 mg daily.    . propranolol (INDERAL) 20 MG tablet Take 1 tablet (20 mg total) by mouth 2 (two) times daily. 60 tablet 11  . SUMAtriptan (IMITREX) 100 MG tablet TAKE 1 TAB BY MOUTH DAILY AS NEEDED FOR MIGRAINE MAY REPEAT IN 2HOURS IF HEADACHE PERSISTS OR RECURS 12 tablet 5  . cariprazine (VRAYLAR) 1.5 MG capsule Take 1 capsule (1.5 mg total) by mouth daily. (Patient not taking: Reported on 10/02/2021) 30 capsule 3  . ondansetron (ZOFRAN) 4 MG tablet Take 1 tablet (4 mg total) by mouth every 8 (eight) hours as needed for nausea or vomiting. (Patient not taking: Reported on 10/02/2021) 20 tablet 1   No facility-administered medications prior to visit.    ROS: Review of Systems  Constitutional:  Negative for activity change, appetite change, chills, fatigue and unexpected  weight change.  HENT:  Negative for congestion, mouth sores and sinus pressure.   Eyes:  Negative for visual disturbance.  Respiratory:  Negative for cough and chest tightness.   Gastrointestinal:  Negative for abdominal pain and nausea.  Genitourinary:  Negative for difficulty urinating, frequency and vaginal pain.  Musculoskeletal:  Negative for back pain and gait problem.  Skin:  Negative for pallor and rash.  Neurological:  Negative for dizziness, tremors, weakness, numbness and headaches.  Psychiatric/Behavioral:  Positive for dysphoric mood. Negative for confusion, sleep disturbance and suicidal ideas. The patient is nervous/anxious.     Objective:  BP 132/84 (BP Location: Left Arm)   Pulse (!) 57   Temp 98.3 F (36.8 C) (Oral)   Ht '5\' 2"'$  (1.575 m)   Wt 129 lb (58.5 kg)   SpO2 97%   BMI 23.59 kg/m   BP Readings from Last 3 Encounters:  10/02/21 132/84  08/09/21 (!) 126/99  07/31/21 (!) 140/100    Wt Readings from Last 3 Encounters:  10/02/21 129 lb (58.5 kg)  08/09/21 125 lb (56.7 kg)  07/31/21 124 lb (56.2 kg)    Physical Exam Constitutional:      General: She is not in acute distress.    Appearance: Normal appearance. She is well-developed.  HENT:     Head: Normocephalic.     Right Ear: External  ear normal.     Left Ear: External ear normal.     Nose: Nose normal.  Eyes:     General:        Right eye: No discharge.        Left eye: No discharge.     Conjunctiva/sclera: Conjunctivae normal.     Pupils: Pupils are equal, round, and reactive to light.  Neck:     Thyroid: No thyromegaly.     Vascular: No JVD.     Trachea: No tracheal deviation.  Cardiovascular:     Rate and Rhythm: Normal rate and regular rhythm.     Heart sounds: Normal heart sounds.  Pulmonary:     Effort: No respiratory distress.     Breath sounds: No stridor. No wheezing.  Abdominal:     General: Bowel sounds are normal. There is no distension.     Palpations: Abdomen is soft.  There is no mass.     Tenderness: There is no abdominal tenderness. There is no guarding or rebound.  Musculoskeletal:        General: No tenderness.     Cervical back: Normal range of motion and neck supple. No rigidity.  Lymphadenopathy:     Cervical: No cervical adenopathy.  Skin:    Findings: No erythema or rash.  Neurological:     Cranial Nerves: No cranial nerve deficit.     Motor: No abnormal muscle tone.     Coordination: Coordination normal.     Deep Tendon Reflexes: Reflexes normal.  Psychiatric:        Behavior: Behavior normal.        Thought Content: Thought content normal.        Judgment: Judgment normal.    Lab Results  Component Value Date   WBC 8.3 07/23/2021   HGB 14.5 07/23/2021   HCT 42.2 07/23/2021   PLT 215 07/23/2021   GLUCOSE 119 (H) 07/23/2021   CHOL 145 12/29/2018   TRIG 62.0 12/29/2018   HDL 47.70 12/29/2018   LDLCALC 85 12/29/2018   ALT 14 05/15/2020   AST 16 05/15/2020   NA 139 07/23/2021   K 3.5 07/23/2021   CL 104 07/23/2021   CREATININE 0.59 07/23/2021   BUN 10 07/23/2021   CO2 25 07/23/2021   TSH 2.64 12/29/2018   INR 1.02 09/28/2014    DG Foot Complete Right  Result Date: 08/09/2021 CLINICAL DATA:  Swelling and pain, history of RIGHT ankle injury. EXAM: RIGHT FOOT COMPLETE - 3+ VIEW COMPARISON:  Ankle evaluation of the same date. FINDINGS: Abundant soft tissue swelling over the lateral aspect of the foot and ankle mainly about the hindfoot and ankle. Swelling over the dorsum of the midfoot as well. Avulsion fracture from the navicular on lateral projection. Also seen on ankle radiographs. IMPRESSION: Avulsion fracture from the navicular with extensive soft tissue swelling over the dorsum of the midfoot and lateral aspect of the foot and ankle. Electronically Signed   By: Zetta Bills M.D.   On: 08/09/2021 15:53   DG Ankle Complete Right  Result Date: 08/09/2021 CLINICAL DATA:  Swelling and pain. Complains of right ankle injury  after stepping in hole rolling ankle. EXAM: RIGHT ANKLE - COMPLETE 3+ VIEW COMPARISON:  None Available. FINDINGS: There is marked lateral and dorsal soft tissue swelling overlying the ankle. Avulsion fracture is identified along the dorsal surface of the navicular bone. No additional fracture identified. IMPRESSION: 1. Avulsion fracture arising from the dorsal surface of the navicular bone.  2. Marked lateral and dorsal soft tissue swelling. Electronically Signed   By: Kerby Moors M.D.   On: 08/09/2021 15:51    Assessment & Plan:   Problem List Items Addressed This Visit     ADD (attention deficit disorder)    Adderall worked better, but is n/a On Ritalin  Potential benefits of a long term amphetamines  use as well as potential risks  and complications were explained to the patient and were aknowledged.      Mood disorder ([place])    Pt stopped Vraylar after 1 mo due to increased urge to smoke and to eat Dtr is using Lamotrigine and Hydroxyzine for mood disorder and is doing well         Meds ordered this encounter  Medications  . hydrOXYzine (ATARAX) 25 MG tablet    Sig: Take 1 tablet (25 mg total) by mouth every 8 (eight) hours as needed for anxiety.    Dispense:  60 tablet    Refill:  0  . lamoTRIgine (LAMICTAL) 25 MG tablet    Sig: Take 1 tablet (25 mg total) by mouth 2 (two) times daily.    Dispense:  60 tablet    Refill:  3      Follow-up: Return in about 6 weeks (around 11/13/2021) for a follow-up visit.  Walker Kehr, MD

## 2021-10-02 NOTE — Assessment & Plan Note (Signed)
Adderall worked better, but is n/a On Ritalin  Potential benefits of a long term amphetamines  use as well as potential risks  and complications were explained to the patient and were aknowledged.

## 2021-10-02 NOTE — Assessment & Plan Note (Addendum)
Pt stopped Vraylar after 1 mo due to increased urge to smoke and to eat Dtr is using Lamotrigine and Hydroxyzine for mood disorder and is doing well

## 2021-10-14 ENCOUNTER — Other Ambulatory Visit: Payer: Self-pay | Admitting: Internal Medicine

## 2021-10-15 MED ORDER — METHYLPHENIDATE HCL ER 20 MG PO TBCR
20.0000 mg | EXTENDED_RELEASE_TABLET | Freq: Every day | ORAL | 0 refills | Status: DC
Start: 2021-10-15 — End: 2021-11-13

## 2021-10-25 ENCOUNTER — Ambulatory Visit: Payer: 59 | Admitting: Physician Assistant

## 2021-11-13 ENCOUNTER — Encounter: Payer: Self-pay | Admitting: Internal Medicine

## 2021-11-13 ENCOUNTER — Ambulatory Visit: Payer: 59 | Admitting: Internal Medicine

## 2021-11-13 DIAGNOSIS — F988 Other specified behavioral and emotional disorders with onset usually occurring in childhood and adolescence: Secondary | ICD-10-CM

## 2021-11-13 DIAGNOSIS — R112 Nausea with vomiting, unspecified: Secondary | ICD-10-CM | POA: Diagnosis not present

## 2021-11-13 DIAGNOSIS — F39 Unspecified mood [affective] disorder: Secondary | ICD-10-CM | POA: Diagnosis not present

## 2021-11-13 DIAGNOSIS — G43109 Migraine with aura, not intractable, without status migrainosus: Secondary | ICD-10-CM

## 2021-11-13 DIAGNOSIS — F419 Anxiety disorder, unspecified: Secondary | ICD-10-CM

## 2021-11-13 MED ORDER — FLUOXETINE HCL 10 MG PO TABS: 10.0000 mg | ORAL_TABLET | Freq: Every day | ORAL | 5 refills | Status: DC

## 2021-11-13 MED ORDER — HYDROXYZINE HCL 25 MG PO TABS: 25.0000 mg | ORAL_TABLET | Freq: Three times a day (TID) | ORAL | 1 refills | Status: DC | PRN

## 2021-11-13 MED ORDER — METHYLPHENIDATE HCL ER 20 MG PO TBCR: 20.0000 mg | EXTENDED_RELEASE_TABLET | Freq: Every day | ORAL | 0 refills | Status: DC

## 2021-11-13 MED ORDER — METHYLPHENIDATE HCL ER 20 MG PO TBCR
20.0000 mg | EXTENDED_RELEASE_TABLET | Freq: Every day | ORAL | 0 refills | Status: DC
Start: 2021-11-13 — End: 2022-02-13

## 2021-11-13 NOTE — Progress Notes (Signed)
Subjective:  Patient ID: Shelly Green, female    DOB: September 17, 1960  Age: 61 y.o. MRN: [id number]  CC: Follow-up (6 week f/u)   HPI Shelly Green presents for ADD, anxiety, HAs Pt d/c Lamictal due to depression side effetcs  Outpatient Medications Prior to Visit  Medication Sig Dispense Refill   acetaminophen (TYLENOL) 500 MG tablet Take 1 tablet (500 mg total) by mouth every 6 (six) hours as needed. 20 tablet 0   Cholecalciferol (VITAMIN D3) 50 MCG (2000 UT) capsule Take 1 capsule (2,000 Units total) by mouth daily. 100 capsule 3   ibuprofen (ADVIL) 400 MG tablet Take 1 tablet (400 mg total) by mouth every 6 (six) hours as needed. 20 tablet 0   propranolol (INDERAL) 20 MG tablet Take 1 tablet (20 mg total) by mouth 2 (two) times daily. 60 tablet 11   SUMAtriptan (IMITREX) 100 MG tablet TAKE 1 TAB BY MOUTH DAILY AS NEEDED FOR MIGRAINE MAY REPEAT IN 2HOURS IF HEADACHE PERSISTS OR RECURS 12 tablet 5   methylphenidate (METADATE ER) 20 MG ER tablet Take 1 tablet (20 mg total) by mouth daily. 30 tablet 0   methylphenidate (METADATE ER) 20 MG ER tablet Take 1 tablet (20 mg total) by mouth daily. 30 tablet 0   methylphenidate (METADATE ER) 20 MG ER tablet Take 1 tablet (20 mg total) by mouth daily. 30 tablet 0   hydrOXYzine (ATARAX) 25 MG tablet Take 1 tablet (25 mg total) by mouth every 8 (eight) hours as needed for anxiety. (Patient not taking: Reported on 11/13/2021) 60 tablet 0   lamoTRIgine (LAMICTAL) 25 MG tablet Take 1 tablet (25 mg total) by mouth 2 (two) times daily. (Patient not taking: Reported on 11/13/2021) 60 tablet 3   nicotine (NICODERM CQ - DOSED IN MG/24 HR) 7 mg/24hr patch 7 mg daily. (Patient not taking: Reported on 11/13/2021)     No facility-administered medications prior to visit.    ROS: Review of Systems  Constitutional:  Positive for unexpected weight change. Negative for activity change, appetite change, chills and fatigue.  HENT:  Negative for congestion,  mouth sores and sinus pressure.   Eyes:  Negative for visual disturbance.  Respiratory:  Negative for cough and chest tightness.   Gastrointestinal:  Negative for abdominal pain and nausea.  Genitourinary:  Negative for difficulty urinating, frequency and vaginal pain.  Musculoskeletal:  Negative for back pain and gait problem.  Skin:  Negative for pallor and rash.  Neurological:  Negative for dizziness, tremors, weakness, numbness and headaches.  Psychiatric/Behavioral:  Negative for confusion and sleep disturbance.     Objective:  BP 130/84 (BP Location: Left Arm)   Pulse (!) 53   Temp 97.8 F (36.6 C) (Oral)   Ht '5\' 2"'$  (1.575 m)   Wt 135 lb 12.8 oz (61.6 kg)   SpO2 97%   BMI 24.84 kg/m   BP Readings from Last 3 Encounters:  11/13/21 130/84  10/02/21 132/84  08/09/21 (!) 126/99    Wt Readings from Last 3 Encounters:  11/13/21 135 lb 12.8 oz (61.6 kg)  10/02/21 129 lb (58.5 kg)  08/09/21 125 lb (56.7 kg)    Physical Exam Constitutional:      General: She is not in acute distress.    Appearance: She is well-developed. She is obese.  HENT:     Head: Normocephalic.     Right Ear: External ear normal.     Left Ear: External ear normal.     Nose:  Nose normal.  Eyes:     General:        Right eye: No discharge.        Left eye: No discharge.     Conjunctiva/sclera: Conjunctivae normal.     Pupils: Pupils are equal, round, and reactive to light.  Neck:     Thyroid: No thyromegaly.     Vascular: No JVD.     Trachea: No tracheal deviation.  Cardiovascular:     Rate and Rhythm: Normal rate and regular rhythm.     Heart sounds: Normal heart sounds.  Pulmonary:     Effort: No respiratory distress.     Breath sounds: No stridor. No wheezing.  Abdominal:     General: Bowel sounds are normal. There is no distension.     Palpations: Abdomen is soft. There is no mass.     Tenderness: There is no abdominal tenderness. There is no guarding or rebound.  Musculoskeletal:         General: No tenderness.     Cervical back: Normal range of motion and neck supple. No rigidity.  Lymphadenopathy:     Cervical: No cervical adenopathy.  Skin:    Findings: No erythema or rash.  Neurological:     Mental Status: She is oriented to person, place, and time.     Cranial Nerves: No cranial nerve deficit.     Motor: No abnormal muscle tone.     Coordination: Coordination normal.     Deep Tendon Reflexes: Reflexes normal.  Psychiatric:        Behavior: Behavior normal.        Thought Content: Thought content normal.        Judgment: Judgment normal.     Lab Results  Component Value Date   WBC 8.3 07/23/2021   HGB 14.5 07/23/2021   HCT 42.2 07/23/2021   PLT 215 07/23/2021   GLUCOSE 119 (H) 07/23/2021   CHOL 145 12/29/2018   TRIG 62.0 12/29/2018   HDL 47.70 12/29/2018   LDLCALC 85 12/29/2018   ALT 14 05/15/2020   AST 16 05/15/2020   NA 139 07/23/2021   K 3.5 07/23/2021   CL 104 07/23/2021   CREATININE 0.59 07/23/2021   BUN 10 07/23/2021   CO2 25 07/23/2021   TSH 2.64 12/29/2018   INR 1.02 09/28/2014    DG Foot Complete Right  Result Date: 08/09/2021 CLINICAL DATA:  Swelling and pain, history of RIGHT ankle injury. EXAM: RIGHT FOOT COMPLETE - 3+ VIEW COMPARISON:  Ankle evaluation of the same date. FINDINGS: Abundant soft tissue swelling over the lateral aspect of the foot and ankle mainly about the hindfoot and ankle. Swelling over the dorsum of the midfoot as well. Avulsion fracture from the navicular on lateral projection. Also seen on ankle radiographs. IMPRESSION: Avulsion fracture from the navicular with extensive soft tissue swelling over the dorsum of the midfoot and lateral aspect of the foot and ankle. Electronically Signed   By: Zetta Bills M.D.   On: 08/09/2021 15:53   DG Ankle Complete Right  Result Date: 08/09/2021 CLINICAL DATA:  Swelling and pain. Complains of right ankle injury after stepping in hole rolling ankle. EXAM: RIGHT ANKLE -  COMPLETE 3+ VIEW COMPARISON:  None Available. FINDINGS: There is marked lateral and dorsal soft tissue swelling overlying the ankle. Avulsion fracture is identified along the dorsal surface of the navicular bone. No additional fracture identified. IMPRESSION: 1. Avulsion fracture arising from the dorsal surface of the navicular bone. 2.  Marked lateral and dorsal soft tissue swelling. Electronically Signed   By: Kerby Moors M.D.   On: 08/09/2021 15:51    Assessment & Plan:   Problem List Items Addressed This Visit     ADD (attention deficit disorder)    On Ritalin      Anxiety disorder    Will try Fluoxetine 10-20 mg/d (added)      Relevant Medications   hydrOXYzine (ATARAX) 25 MG tablet   FLUoxetine (PROZAC) 10 MG tablet   Migraine headache with aura    Neck massage, contour pillow      Relevant Medications   FLUoxetine (PROZAC) 10 MG tablet   Mood disorder (HCC)    Lamotrigine  - pt stopped (she was worse) - d/c      Nausea & vomiting    Hydroxyzine prn         Meds ordered this encounter  Medications   hydrOXYzine (ATARAX) 25 MG tablet    Sig: Take 1 tablet (25 mg total) by mouth every 8 (eight) hours as needed for anxiety.    Dispense:  60 tablet    Refill:  1   FLUoxetine (PROZAC) 10 MG tablet    Sig: Take 1-2 tablets (10-20 mg total) by mouth daily.    Dispense:  60 tablet    Refill:  5   methylphenidate (METADATE ER) 20 MG ER tablet    Sig: Take 1 tablet (20 mg total) by mouth daily.    Dispense:  30 tablet    Refill:  0    Please fill on or after 01/12/22   methylphenidate (METADATE ER) 20 MG ER tablet    Sig: Take 1 tablet (20 mg total) by mouth daily.    Dispense:  30 tablet    Refill:  0    Please fill on or after 12/13/21   methylphenidate (METADATE ER) 20 MG ER tablet    Sig: Take 1 tablet (20 mg total) by mouth daily.    Dispense:  30 tablet    Refill:  0    Please fill on or after 11/13/21      Follow-up: No follow-ups on file.  Walker Kehr, MD

## 2021-11-13 NOTE — Assessment & Plan Note (Signed)
Lamotrigine  - pt stopped (she was worse) - d/c

## 2021-11-13 NOTE — Assessment & Plan Note (Signed)
Hydroxyzine prn

## 2021-11-13 NOTE — Assessment & Plan Note (Signed)
On Ritalin

## 2021-11-13 NOTE — Assessment & Plan Note (Signed)
Neck massage, contour pillow

## 2021-11-13 NOTE — Assessment & Plan Note (Signed)
Will try Fluoxetine 10-20 mg/d (added)

## 2021-11-27 ENCOUNTER — Ambulatory Visit: Payer: 59 | Admitting: Neurology

## 2021-12-06 ENCOUNTER — Other Ambulatory Visit: Payer: Self-pay | Admitting: Internal Medicine

## 2021-12-19 NOTE — Progress Notes (Signed)
NEUROLOGY FOLLOW UP OFFICE NOTE  Shelly Green [id number]  Assessment/Plan:   Migraine without aura, with status migrainosus, not intractable - 5 days a month of migraine would qualify for preventative medication.    Migraine prevention:  She is changing insurance next month.  When she gets her new insurance information, she will contact us and we can start Emgality every 28 days.  Would avoid Aimovig due to hypertension (just recently got it controlled with medication) Migraine rescue:  sumatriptan tab Limit use of pain relievers to no more than 2 days out of week to prevent risk of rebound or medication-overuse headache. Keep headache diary Follow up in June     Subjective:  Shelly Green is a 61 year old right-handed female with migraines, ADD, anxiety, arthritis, IBS, migraines and history of breast cancer in remission who follows up for migraines.   UPDATE: Last seen a year ago.  At that time, she reported migraines started increasing after getting COVID, so she was switched from propranolol to topiramate.  Eventually, she stopped topiramate and other medications due to side effects.    She was seen in the ED on 07/23/2021 for an intractable migraine where CT Head (personally reviewed) revealed no acute findings.  She followed up with her PCP who restarted her on propranolol for blood pressure and sumatriptan  Intensity:  moderate Duration:  4-5 days (if treated, it is severe for an hour but has a low-grade/manageable headache for the next 4 to 5 days) Frequency:  1 a month (sometimes 2 times a month) Rescue protocol:  Tylenol first line, sumatriptan second line.  Will take Fioricet if onset is severe and quick. Current NSAIDS/analgesics:  ASA Current triptans:  sumatriptan '100mg'$  Current ergotamine:  none Current anti-emetic:  none Current muscle relaxants:  none Current Antihypertensive medications:  propranolol '20mg'$  BID Current Antidepressant medications:  fluoxetine  '10mg'$ -'20mg'$  Current Anticonvulsant medications:  none Current anti-CGRP:  none Current Vitamins/Herbal/Supplements:  MVI, D Current Antihistamines/Decongestants:  none Other therapy:  OMM Hormone/birth control:  Femara Other medications:  Adderall XR, BuSpar, lorazepam   Caffeine:  No coffee.  Maybe 1 small coca cola daily Diet:  Drinks little water. Exercise:  yes Depression:  yes; Anxiety:  none Other pain:  Chronic low back pain Sleep:  Poor.  Hot flashes at night.  Takes gabapentin   HISTORY:  Started having headaches as a child.  They were moderate to severe bifrontal pressupe to throbbing.  They are associated with nausea, photophobia and phonophobia.  If takes something early and lay down, they will last within an hour, otherwise all day.  They occur at least 2 to 3 days a week.  Triggers include change in weather and seasonal allergies.  Relieving factors include rest.    Increased after starting Wellbutrin.  Severe pain in jaw and back of head.  Neck pain.  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  Associated with nausea, vomiting, photophobia and phonophobia.  They were daily.  Stopped Wellbutrin after 5 days.  Since stopping Wellbutrin, headaches decreased to once a week and now back to baseline.       Past NSAIDS/analgesics:  Diclofenac, tramadol, ibuprofen, Fioricet Past abortive triptans:  rizatriptan Past abortive ergotamine:  none Past muscle relaxants:  none Past anti-emetic:  Zofran, Compazine Past antihypertensive medications:  propranolol Past antidepressant medications:  Cymbalta, Wellbutrin (aggravated headaches), paroxetine, Trintellix Past anticonvulsant medications:  topiramate, gabapentin, lamotrigine Past  anti-CGRP:  none Past vitamins/Herbal/Supplements:  none Past antihistamines/decongestants:  Benadryl, Flonase, Claritin Other past therapies:  none     Family history of  headache:  Maternal grandmother, mother, sisters  PAST MEDICAL HISTORY: Past Medical History:  Diagnosis Date   Arthritis    Cancer ([place])    breast    History of colon polyps    HTN (hypertension) 07/31/2021   IBS (irritable bowel syndrome)    Menopausal disorder    Migraines    Seasonal allergies    Social anxiety disorder     MEDICATIONS: Current Outpatient Medications on File Prior to Visit  Medication Sig Dispense Refill   Cholecalciferol (VITAMIN D3) 50 MCG (2000 UT) capsule Take 1 capsule (2,000 Units total) by mouth daily. 100 capsule 3   FLUoxetine (PROZAC) 10 MG tablet TAKE 1-2 TABLETS (10-20 MG TOTAL) BY MOUTH DAILY. 180 tablet 2   hydrOXYzine (ATARAX) 25 MG tablet Take 1 tablet (25 mg total) by mouth every 8 (eight) hours as needed for anxiety. 60 tablet 1   ibuprofen (ADVIL) 400 MG tablet Take 1 tablet (400 mg total) by mouth every 6 (six) hours as needed. 20 tablet 0   methylphenidate (METADATE ER) 20 MG ER tablet Take 1 tablet (20 mg total) by mouth daily. 30 tablet 0   methylphenidate (METADATE ER) 20 MG ER tablet Take 1 tablet (20 mg total) by mouth daily. 30 tablet 0   methylphenidate (METADATE ER) 20 MG ER tablet Take 1 tablet (20 mg total) by mouth daily. 30 tablet 0   propranolol (INDERAL) 20 MG tablet Take 1 tablet (20 mg total) by mouth 2 (two) times daily. 60 tablet 11   No current facility-administered medications on file prior to visit.     ALLERGIES: Allergies  Allergen Reactions   Codeine Anaphylaxis   Hydrocodone Shortness Of Breath   Penicillins Anaphylaxis    As a child Has patient had a PCN reaction causing immediate rash, facial/tongue/throat swelling, SOB or lightheadedness with hypotension: yes Has patient had a PCN reaction causing severe rash involving mucus membranes or skin necrosis: unknown Has patient had a PCN reaction that required hospitalization: yes Has patient had a PCN reaction occurring within the last 10 years: no If all of  the above answers are "NO", then may proceed with Cephalosporin use.    Pholcodine Other (See Comments)   Sulfa Antibiotics Anaphylaxis and Rash   Tramadol Shortness Of Breath   Clindamycin/Lincomycin     rush   Lamictal [Lamotrigine]     depression   Wellbutrin [Bupropion]     HAs, anger    FAMILY HISTORY: Family History  Problem Relation Age of Onset   Arthritis Mother    Lymphoma Mother    Colon cancer Sister 20   Mental illness Daughter    Mental illness Maternal Aunt    Arthritis Maternal Grandmother    Hypertension Maternal Grandfather       Objective:  Blood pressure 130/84, pulse 73, height '5\' 2"'$  (1.575 m), weight 137 lb 3.2 oz (62.2 kg), SpO2 98 %. General: No acute distress.  Patient appears well-groomed.   Head:  Normocephalic/atraumatic Eyes:  Fundi examined but not visualized Neck: supple, no paraspinal tenderness, full range of motion Heart:  Regular rate and rhythm Lungs:  Clear to auscultation bilaterally Back: No paraspinal tenderness Neurological Exam: alert and oriented to person, place, and time.  Speech fluent and not dysarthric, language intact.  CN II-XII intact. Bulk and tone normal, muscle strength 5/5  throughout.  Sensation to light touch intact.  Deep tendon reflexes 2+ throughout, toes downgoing.  Finger to nose testing intact.  Gait normal, Romberg negative.   Metta Clines, DO  CC: Lew Dawes, MD

## 2021-12-21 ENCOUNTER — Encounter: Payer: Self-pay | Admitting: Neurology

## 2021-12-21 ENCOUNTER — Ambulatory Visit (INDEPENDENT_AMBULATORY_CARE_PROVIDER_SITE_OTHER): Payer: 59 | Admitting: Neurology

## 2021-12-21 VITALS — BP 130/84 | HR 73 | Ht 62.0 in | Wt 137.2 lb

## 2021-12-21 DIAGNOSIS — G43101 Migraine with aura, not intractable, with status migrainosus: Secondary | ICD-10-CM

## 2021-12-21 MED ORDER — SUMATRIPTAN SUCCINATE 100 MG PO TABS
ORAL_TABLET | ORAL | 5 refills | Status: DC
Start: 2021-12-21 — End: 2022-06-25

## 2021-12-21 NOTE — Patient Instructions (Addendum)
Plan is to start Emgality, a monthly injection for migraines.  When you get your new insurance information, contact us and we can send a prescription  Sumatriptan as needed but Limit use to no more than 2 days out of week on regular basis to prevent risk of rebound or medication-overuse headache.  Follow up in June   2 antidepressants to consider:  sertraline, venlafaxine

## 2021-12-28 ENCOUNTER — Other Ambulatory Visit: Payer: Self-pay | Admitting: Internal Medicine

## 2022-02-13 ENCOUNTER — Ambulatory Visit: Payer: Managed Care, Other (non HMO) | Admitting: Internal Medicine

## 2022-02-13 ENCOUNTER — Encounter: Payer: Self-pay | Admitting: Internal Medicine

## 2022-02-13 VITALS — Ht 62.0 in

## 2022-02-13 DIAGNOSIS — R232 Flushing: Secondary | ICD-10-CM

## 2022-02-13 DIAGNOSIS — T451X5A Adverse effect of antineoplastic and immunosuppressive drugs, initial encounter: Secondary | ICD-10-CM | POA: Diagnosis not present

## 2022-02-13 DIAGNOSIS — F419 Anxiety disorder, unspecified: Secondary | ICD-10-CM

## 2022-02-13 DIAGNOSIS — F988 Other specified behavioral and emotional disorders with onset usually occurring in childhood and adolescence: Secondary | ICD-10-CM | POA: Diagnosis not present

## 2022-02-13 MED ORDER — AMPHETAMINE-DEXTROAMPHET ER 30 MG PO CP24: 30.0000 mg | ORAL_CAPSULE | ORAL | 0 refills | Status: DC

## 2022-02-13 MED ORDER — AMPHETAMINE-DEXTROAMPHET ER 30 MG PO CP24: 30.0000 mg | ORAL_CAPSULE | Freq: Every day | ORAL | 0 refills | Status: DC

## 2022-02-13 MED ORDER — VENLAFAXINE HCL ER 37.5 MG PO CP24: 75.0000 mg | ORAL_CAPSULE | Freq: Every day | ORAL | 5 refills | Status: DC

## 2022-02-13 NOTE — Progress Notes (Signed)
Subjective:  Patient ID: Shelly Green, female    DOB: [birth year]  Age: 62 y.o. MRN: [id number]  CC: No chief complaint on file.   HPI Shelly Green presents for anxiety, ADD. C/o problems w/Ritalin. Adderall worked better Dr Tomi Likens suggested Effexor  Outpatient Medications Prior to Visit  Medication Sig Dispense Refill   Cholecalciferol (VITAMIN D3) 50 MCG (2000 UT) capsule Take 1 capsule (2,000 Units total) by mouth daily. 100 capsule 3   hydrOXYzine (ATARAX) 25 MG tablet Take 1 tablet (25 mg total) by mouth every 8 (eight) hours as needed for anxiety. 60 tablet 1   ibuprofen (ADVIL) 400 MG tablet Take 1 tablet (400 mg total) by mouth every 6 (six) hours as needed. 20 tablet 0   lamoTRIgine (LAMICTAL) 25 MG tablet TAKE 1 TABLET BY MOUTH TWICE A DAY 180 tablet 1   propranolol (INDERAL) 20 MG tablet Take 1 tablet (20 mg total) by mouth 2 (two) times daily. 60 tablet 11   SUMAtriptan (IMITREX) 100 MG tablet TAKE 1 TAB BY MOUTH DAILY AS NEEDED FOR MIGRAINE MAY REPEAT IN 2HOURS IF HEADACHE PERSISTS OR RECURS 12 tablet 5   FLUoxetine (PROZAC) 10 MG tablet TAKE 1-2 TABLETS (10-20 MG TOTAL) BY MOUTH DAILY. 180 tablet 2   methylphenidate (METADATE ER) 20 MG ER tablet Take 1 tablet (20 mg total) by mouth daily. 30 tablet 0   methylphenidate (METADATE ER) 20 MG ER tablet Take 1 tablet (20 mg total) by mouth daily. 30 tablet 0   methylphenidate (METADATE ER) 20 MG ER tablet Take 1 tablet (20 mg total) by mouth daily. 30 tablet 0   No facility-administered medications prior to visit.    ROS: Review of Systems  Constitutional:  Negative for activity change, appetite change, chills, fatigue and unexpected weight change.  HENT:  Negative for congestion, mouth sores and sinus pressure.   Eyes:  Negative for visual disturbance.  Respiratory:  Negative for cough and chest tightness.   Gastrointestinal:  Negative for abdominal pain and nausea.  Genitourinary:  Negative for difficulty urinating,  frequency and vaginal pain.  Musculoskeletal:  Negative for back pain and gait problem.  Skin:  Negative for pallor and rash.  Neurological:  Negative for dizziness, tremors, weakness, numbness and headaches.  Psychiatric/Behavioral:  Positive for decreased concentration and dysphoric mood. Negative for confusion, sleep disturbance and suicidal ideas. The patient is nervous/anxious.     Objective:  Ht '5\' 2"'$  (1.575 m)   BMI 25.09 kg/m   BP Readings from Last 3 Encounters:  12/21/21 130/84  11/13/21 130/84  10/02/21 132/84    Wt Readings from Last 3 Encounters:  12/21/21 137 lb 3.2 oz (62.2 kg)  11/13/21 135 lb 12.8 oz (61.6 kg)  10/02/21 129 lb (58.5 kg)    Physical Exam Constitutional:      General: She is not in acute distress.    Appearance: Normal appearance. She is well-developed.  HENT:     Head: Normocephalic.     Right Ear: External ear normal.     Left Ear: External ear normal.     Nose: Nose normal.  Eyes:     General:        Right eye: No discharge.        Left eye: No discharge.     Conjunctiva/sclera: Conjunctivae normal.     Pupils: Pupils are equal, round, and reactive to light.  Neck:     Thyroid: No thyromegaly.     Vascular: No  JVD.     Trachea: No tracheal deviation.  Cardiovascular:     Rate and Rhythm: Normal rate and regular rhythm.     Heart sounds: Normal heart sounds.  Pulmonary:     Effort: No respiratory distress.     Breath sounds: No stridor. No wheezing.  Abdominal:     General: Bowel sounds are normal. There is no distension.     Palpations: Abdomen is soft. There is no mass.     Tenderness: There is no abdominal tenderness. There is no guarding or rebound.  Musculoskeletal:        General: No tenderness.     Cervical back: Normal range of motion and neck supple. No rigidity.  Lymphadenopathy:     Cervical: No cervical adenopathy.  Skin:    Findings: No erythema or rash.  Neurological:     Cranial Nerves: No cranial nerve  deficit.     Motor: No abnormal muscle tone.     Coordination: Coordination normal.     Deep Tendon Reflexes: Reflexes normal.  Psychiatric:        Behavior: Behavior normal.        Thought Content: Thought content normal.        Judgment: Judgment normal.     Lab Results  Component Value Date   WBC 8.3 07/23/2021   HGB 14.5 07/23/2021   HCT 42.2 07/23/2021   PLT 215 07/23/2021   GLUCOSE 119 (H) 07/23/2021   CHOL 145 12/29/2018   TRIG 62.0 12/29/2018   HDL 47.70 12/29/2018   LDLCALC 85 12/29/2018   ALT 14 05/15/2020   AST 16 05/15/2020   NA 139 07/23/2021   K 3.5 07/23/2021   CL 104 07/23/2021   CREATININE 0.59 07/23/2021   BUN 10 07/23/2021   CO2 25 07/23/2021   TSH 2.64 12/29/2018   INR 1.02 09/28/2014    DG Foot Complete Right  Result Date: 08/09/2021 CLINICAL DATA:  Swelling and pain, history of RIGHT ankle injury. EXAM: RIGHT FOOT COMPLETE - 3+ VIEW COMPARISON:  Ankle evaluation of the same date. FINDINGS: Abundant soft tissue swelling over the lateral aspect of the foot and ankle mainly about the hindfoot and ankle. Swelling over the dorsum of the midfoot as well. Avulsion fracture from the navicular on lateral projection. Also seen on ankle radiographs. IMPRESSION: Avulsion fracture from the navicular with extensive soft tissue swelling over the dorsum of the midfoot and lateral aspect of the foot and ankle. Electronically Signed   By: Zetta Bills M.D.   On: 08/09/2021 15:53   DG Ankle Complete Right  Result Date: 08/09/2021 CLINICAL DATA:  Swelling and pain. Complains of right ankle injury after stepping in hole rolling ankle. EXAM: RIGHT ANKLE - COMPLETE 3+ VIEW COMPARISON:  None Available. FINDINGS: There is marked lateral and dorsal soft tissue swelling overlying the ankle. Avulsion fracture is identified along the dorsal surface of the navicular bone. No additional fracture identified. IMPRESSION: 1. Avulsion fracture arising from the dorsal surface of the  navicular bone. 2. Marked lateral and dorsal soft tissue swelling. Electronically Signed   By: Kerby Moors M.D.   On: 08/09/2021 15:51    Assessment & Plan:   Problem List Items Addressed This Visit       Cardiovascular and Mediastinum   Hot flashes related to aromatase inhibitor therapy    Start  Effexor D/c Fluoxetine        Other   ADD (attention deficit disorder) - Primary  C/o problems w/Ritalin. Adderall worked better - will Rx       Anxiety disorder     Dr Tomi Likens suggested Effexor - given Rx      Relevant Medications   venlafaxine XR (EFFEXOR XR) 37.5 MG 24 hr capsule      Meds ordered this encounter  Medications   venlafaxine XR (EFFEXOR XR) 37.5 MG 24 hr capsule    Sig: Take 2 capsules (75 mg total) by mouth daily with breakfast.    Dispense:  60 capsule    Refill:  5   amphetamine-dextroamphetamine (ADDERALL XR) 30 MG 24 hr capsule    Sig: Take 1 capsule (30 mg total) by mouth every morning.    Dispense:  30 capsule    Refill:  0    Please fill on or after 02/13/22   amphetamine-dextroamphetamine (ADDERALL XR) 30 MG 24 hr capsule    Sig: Take 1 capsule (30 mg total) by mouth daily.    Dispense:  30 capsule    Refill:  0    Please fill on or after 03/15/22   amphetamine-dextroamphetamine (ADDERALL XR) 30 MG 24 hr capsule    Sig: Take 1 capsule (30 mg total) by mouth every morning.    Dispense:  30 capsule    Refill:  0    Please fill on or after 04/12/22      Follow-up: Return in about 3 months (around 05/15/2022) for Wellness Exam.  Walker Kehr, MD

## 2022-02-13 NOTE — Assessment & Plan Note (Signed)
  Dr Tomi Likens suggested Effexor - given Rx

## 2022-02-13 NOTE — Assessment & Plan Note (Signed)
C/o problems w/Ritalin. Adderall worked better - will Rx

## 2022-02-13 NOTE — Assessment & Plan Note (Signed)
Start  Effexor D/c Fluoxetine

## 2022-03-07 ENCOUNTER — Other Ambulatory Visit: Payer: Self-pay | Admitting: Internal Medicine

## 2022-03-21 ENCOUNTER — Other Ambulatory Visit: Payer: Self-pay | Admitting: Internal Medicine

## 2022-05-15 ENCOUNTER — Ambulatory Visit: Payer: Managed Care, Other (non HMO) | Admitting: Internal Medicine

## 2022-05-15 ENCOUNTER — Encounter: Payer: Self-pay | Admitting: Internal Medicine

## 2022-05-15 VITALS — BP 128/80 | HR 67 | Temp 98.4°F | Ht 62.0 in | Wt 143.0 lb

## 2022-05-15 DIAGNOSIS — I10 Essential (primary) hypertension: Secondary | ICD-10-CM | POA: Diagnosis not present

## 2022-05-15 DIAGNOSIS — T451X5A Adverse effect of antineoplastic and immunosuppressive drugs, initial encounter: Secondary | ICD-10-CM | POA: Diagnosis not present

## 2022-05-15 DIAGNOSIS — R232 Flushing: Secondary | ICD-10-CM | POA: Diagnosis not present

## 2022-05-15 DIAGNOSIS — F339 Major depressive disorder, recurrent, unspecified: Secondary | ICD-10-CM

## 2022-05-15 MED ORDER — AMPHETAMINE-DEXTROAMPHET ER 30 MG PO CP24: 30.0000 mg | ORAL_CAPSULE | ORAL | 0 refills | Status: DC

## 2022-05-15 MED ORDER — DESVENLAFAXINE SUCCINATE ER 50 MG PO TB24: 50.0000 mg | ORAL_TABLET | Freq: Every day | ORAL | 5 refills | Status: DC

## 2022-05-15 MED ORDER — VEOZAH 45 MG PO TABS
45.0000 mg | ORAL_TABLET | Freq: Every day | ORAL | 5 refills | Status: DC
Start: 2022-05-15 — End: 2022-10-02

## 2022-05-15 MED ORDER — AMPHETAMINE-DEXTROAMPHET ER 30 MG PO CP24: 30.0000 mg | ORAL_CAPSULE | Freq: Every day | ORAL | 0 refills | Status: DC

## 2022-05-15 NOTE — Progress Notes (Signed)
Subjective:  Patient ID: Shelly Green, female    DOB: 04/07/1960  Age: 62 y.o. MRN: [id number]  CC: No chief complaint on file.   HPI Shelly Green presents for depression, ADD Effexor helped depression, but made her tired C/o hot flashes  Outpatient Medications Prior to Visit  Medication Sig Dispense Refill   Cholecalciferol (VITAMIN D3) 50 MCG (2000 UT) capsule Take 1 capsule (2,000 Units total) by mouth daily. 100 capsule 3   hydrOXYzine (ATARAX) 25 MG tablet TAKE 1 TABLET BY MOUTH EVERY 8 HOURS AS NEEDED FOR ANXIETY. 60 tablet 1   ibuprofen (ADVIL) 400 MG tablet Take 1 tablet (400 mg total) by mouth every 6 (six) hours as needed. 20 tablet 0   lamoTRIgine (LAMICTAL) 25 MG tablet TAKE 1 TABLET BY MOUTH TWICE A DAY 180 tablet 1   propranolol (INDERAL) 20 MG tablet Take 1 tablet (20 mg total) by mouth 2 (two) times daily. 60 tablet 11   SUMAtriptan (IMITREX) 100 MG tablet TAKE 1 TAB BY MOUTH DAILY AS NEEDED FOR MIGRAINE MAY REPEAT IN 2HOURS IF HEADACHE PERSISTS OR RECURS 12 tablet 5   amphetamine-dextroamphetamine (ADDERALL XR) 30 MG 24 hr capsule Take 1 capsule (30 mg total) by mouth every morning. 30 capsule 0   amphetamine-dextroamphetamine (ADDERALL XR) 30 MG 24 hr capsule Take 1 capsule (30 mg total) by mouth daily. 30 capsule 0   amphetamine-dextroamphetamine (ADDERALL XR) 30 MG 24 hr capsule Take 1 capsule (30 mg total) by mouth every morning. 30 capsule 0   venlafaxine XR (EFFEXOR-XR) 37.5 MG 24 hr capsule TAKE 2 CAPSULES (75 MG TOTAL) BY MOUTH DAILY WITH BREAKFAST. 180 capsule 3   No facility-administered medications prior to visit.    ROS: Review of Systems  Constitutional:  Positive for fatigue. Negative for activity change, appetite change, chills and unexpected weight change.  HENT:  Negative for congestion, mouth sores and sinus pressure.   Eyes:  Negative for visual disturbance.  Respiratory:  Negative for cough and chest tightness.   Gastrointestinal:   Negative for abdominal pain and nausea.  Genitourinary:  Negative for difficulty urinating, frequency and vaginal pain.  Musculoskeletal:  Negative for back pain and gait problem.  Skin:  Negative for pallor and rash.  Neurological:  Negative for dizziness, tremors, weakness, numbness and headaches.  Psychiatric/Behavioral:  Negative for confusion and sleep disturbance.     Objective:  There were no vitals taken for this visit.  BP Readings from Last 3 Encounters:  12/21/21 130/84  11/13/21 130/84  10/02/21 132/84    Wt Readings from Last 3 Encounters:  12/21/21 137 lb 3.2 oz (62.2 kg)  11/13/21 135 lb 12.8 oz (61.6 kg)  10/02/21 129 lb (58.5 kg)    Physical Exam Constitutional:      General: She is not in acute distress.    Appearance: She is well-developed.  HENT:     Head: Normocephalic.     Right Ear: External ear normal.     Left Ear: External ear normal.     Nose: Nose normal.  Eyes:     General:        Right eye: No discharge.        Left eye: No discharge.     Conjunctiva/sclera: Conjunctivae normal.     Pupils: Pupils are equal, round, and reactive to light.  Neck:     Thyroid: No thyromegaly.     Vascular: No JVD.     Trachea: No tracheal deviation.  Cardiovascular:     Rate and Rhythm: Normal rate and regular rhythm.     Heart sounds: Normal heart sounds.  Pulmonary:     Effort: No respiratory distress.     Breath sounds: No stridor. No wheezing.  Abdominal:     General: Bowel sounds are normal. There is no distension.     Palpations: Abdomen is soft. There is no mass.     Tenderness: There is no abdominal tenderness. There is no guarding or rebound.  Musculoskeletal:        General: No tenderness.     Cervical back: Normal range of motion and neck supple. No rigidity.  Lymphadenopathy:     Cervical: No cervical adenopathy.  Skin:    Findings: No erythema or rash.  Neurological:     Cranial Nerves: No cranial nerve deficit.     Motor: No  abnormal muscle tone.     Coordination: Coordination normal.     Deep Tendon Reflexes: Reflexes normal.  Psychiatric:        Behavior: Behavior normal.        Thought Content: Thought content normal.        Judgment: Judgment normal.     Lab Results  Component Value Date   WBC 8.3 07/23/2021   HGB 14.5 07/23/2021   HCT 42.2 07/23/2021   PLT 215 07/23/2021   GLUCOSE 119 (H) 07/23/2021   CHOL 145 12/29/2018   TRIG 62.0 12/29/2018   HDL 47.70 12/29/2018   LDLCALC 85 12/29/2018   ALT 14 05/15/2020   AST 16 05/15/2020   NA 139 07/23/2021   K 3.5 07/23/2021   CL 104 07/23/2021   CREATININE 0.59 07/23/2021   BUN 10 07/23/2021   CO2 25 07/23/2021   TSH 2.64 12/29/2018   INR 1.02 09/28/2014    DG Foot Complete Right  Result Date: 08/09/2021 CLINICAL DATA:  Swelling and pain, history of RIGHT ankle injury. EXAM: RIGHT FOOT COMPLETE - 3+ VIEW COMPARISON:  Ankle evaluation of the same date. FINDINGS: Abundant soft tissue swelling over the lateral aspect of the foot and ankle mainly about the hindfoot and ankle. Swelling over the dorsum of the midfoot as well. Avulsion fracture from the navicular on lateral projection. Also seen on ankle radiographs. IMPRESSION: Avulsion fracture from the navicular with extensive soft tissue swelling over the dorsum of the midfoot and lateral aspect of the foot and ankle. Electronically Signed   By: Donzetta Kohut M.D.   On: 08/09/2021 15:53   DG Ankle Complete Right  Result Date: 08/09/2021 CLINICAL DATA:  Swelling and pain. Complains of right ankle injury after stepping in hole rolling ankle. EXAM: RIGHT ANKLE - COMPLETE 3+ VIEW COMPARISON:  None Available. FINDINGS: There is marked lateral and dorsal soft tissue swelling overlying the ankle. Avulsion fracture is identified along the dorsal surface of the navicular bone. No additional fracture identified. IMPRESSION: 1. Avulsion fracture arising from the dorsal surface of the navicular bone. 2. Marked  lateral and dorsal soft tissue swelling. Electronically Signed   By: Signa Kell M.D.   On: 08/09/2021 15:51    Assessment & Plan:   Problem List Items Addressed This Visit       Cardiovascular and Mediastinum   Hot flashes related to aromatase inhibitor therapy    Veozah option discussed - Rx given      HTN (hypertension)    Start Propranolol bid        Other   Depression - Primary  Effexor helped depression, but made her tired - d/c Start Pristiq 50 qd      Relevant Medications   desvenlafaxine (PRISTIQ) 50 MG 24 hr tablet      Meds ordered this encounter  Medications   desvenlafaxine (PRISTIQ) 50 MG 24 hr tablet    Sig: Take 1 tablet (50 mg total) by mouth daily.    Dispense:  30 tablet    Refill:  5   Fezolinetant (VEOZAH) 45 MG TABS    Sig: Take 1 tablet (45 mg total) by mouth daily.    Dispense:  30 tablet    Refill:  5   amphetamine-dextroamphetamine (ADDERALL XR) 30 MG 24 hr capsule    Sig: Take 1 capsule (30 mg total) by mouth every morning.    Dispense:  30 capsule    Refill:  0    Please fill on or after 05/12/22   amphetamine-dextroamphetamine (ADDERALL XR) 30 MG 24 hr capsule    Sig: Take 1 capsule (30 mg total) by mouth daily.    Dispense:  30 capsule    Refill:  0    Please fill on or after 06/11/22   amphetamine-dextroamphetamine (ADDERALL XR) 30 MG 24 hr capsule    Sig: Take 1 capsule (30 mg total) by mouth every morning.    Dispense:  30 capsule    Refill:  0    Please fill on or after 07/11/22      Follow-up: Return in about 3 months (around 08/14/2022) for a follow-up visit.  [name], MD

## 2022-05-15 NOTE — Assessment & Plan Note (Signed)
Start Propranolol bid

## 2022-05-15 NOTE — Assessment & Plan Note (Signed)
Effexor helped depression, but made her tired - d/c Start Pristiq 50 qd

## 2022-05-15 NOTE — Assessment & Plan Note (Signed)
Veozah option discussed - Rx given

## 2022-06-06 ENCOUNTER — Other Ambulatory Visit: Payer: Self-pay | Admitting: Internal Medicine

## 2022-06-24 NOTE — Progress Notes (Unsigned)
NEUROLOGY FOLLOW UP OFFICE NOTE  Shelly Green [id number]  Assessment/Plan:   Migraine without aura, with status migrainosus, not intractable   Migraine prevention:  Start Emgality.  Would avoid Aimovig due to hypertension (just recently got it controlled with medication) Migraine rescue:  sumatriptan tablet  Limit use of pain relievers to no more than 2 days out of week to prevent risk of rebound or medication-overuse headache. Keep headache diary Follow up 6 months     Subjective:  Shelly Green is a 62 year old right-handed female with migraines, ADD, anxiety, arthritis, IBS, migraines and history of breast cancer in remission who follows up for migraines.   UPDATE:  Intensity:  moderate Duration:  1 hour with sumatriptan Frequency:  5 days Rescue protocol:  sumatriptan Current NSAIDS/analgesics:  Tylenol, ASA Current triptans:  sumatriptan 100mg  Current ergotamine:  none Current anti-emetic:  none Current muscle relaxants:  none Current Antihypertensive medications:  propranolol 20mg  twice daily Current Antidepressant medications: desvenlafaxine Current Anticonvulsant medications:  lamotrigine Current anti-CGRP:  none Current Vitamins/Herbal/Supplements:  MVI, D Current Antihistamines/Decongestants:  none Other therapy:  OMM Hormone/birth control:  Femara Other medications:  Adderall XR, hydroxyzine   Caffeine:  No coffee.  Maybe 1 small coca cola daily Diet:  Drinks little water. Exercise:  yes Depression:  yes; Anxiety:  none Other pain:  Chronic low back pain Sleep:  Poor.  Hot flashes at night.    HISTORY:  Started having headaches as a child.  They were moderate to severe bifrontal pressupe to throbbing.  They are associated with nausea, photophobia and phonophobia.  If takes something early and lay down, they will last within an hour, otherwise all day.  They occur at least 2 to 3 days a week.  Triggers include change in weather and seasonal  allergies.  Relieving factors include rest.    Increased after starting Wellbutrin.  Severe pain in jaw and back of head.  Neck pain.  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  Associated with nausea, vomiting, photophobia and phonophobia.  They were daily.  Stopped Wellbutrin after 5 days.  Since stopping Wellbutrin, headaches decreased to once a week and now back to baseline.       Past NSAIDS/analgesics:  Diclofenac, tramadol, ibuprofen, Fioricet Past abortive triptans:  rizatriptan Past abortive ergotamine:  none Past muscle relaxants:  none Past anti-emetic:  Zofran, Compazine Past antihypertensive medications:  none Past antidepressant medications:  Cymbalta, Wellbutrin (aggravated headaches), paroxetine, fluoxetine, Trintellix Past anticonvulsant medications:  topiramate, gabapentin Past anti-CGRP:  none Past vitamins/Herbal/Supplements:  none Past antihistamines/decongestants:  Benadryl, Flonase, Claritin Other past therapies:  none     Family history of headache:  Maternal grandmother, mother, sisters  PAST MEDICAL HISTORY: Past Medical History:  Diagnosis Date   Arthritis    Cancer (HCC)    breast    History of colon polyps    HTN (hypertension) 07/31/2021   IBS (irritable bowel syndrome)    Menopausal disorder    Migraines    Seasonal allergies    Social anxiety disorder     MEDICATIONS: Current Outpatient Medications on File Prior to Visit  Medication Sig Dispense Refill   amphetamine-dextroamphetamine (ADDERALL XR) 30 MG 24 hr capsule Take 1 capsule (30 mg total) by mouth every morning. 30 capsule 0   amphetamine-dextroamphetamine (ADDERALL XR) 30 MG 24 hr capsule Take 1 capsule (30 mg total) by mouth daily. 30 capsule 0  amphetamine-dextroamphetamine (ADDERALL XR) 30 MG 24 hr capsule Take 1 capsule (30 mg total) by mouth every morning. 30 capsule 0   Cholecalciferol (VITAMIN  D3) 50 MCG (2000 UT) capsule Take 1 capsule (2,000 Units total) by mouth daily. 100 capsule 3   desvenlafaxine (PRISTIQ) 50 MG 24 hr tablet TAKE 1 TABLET BY MOUTH EVERY DAY 90 tablet 1   Fezolinetant (VEOZAH) 45 MG TABS Take 1 tablet (45 mg total) by mouth daily. 30 tablet 5   hydrOXYzine (ATARAX) 25 MG tablet TAKE 1 TABLET BY MOUTH EVERY 8 HOURS AS NEEDED FOR ANXIETY. 60 tablet 1   ibuprofen (ADVIL) 400 MG tablet Take 1 tablet (400 mg total) by mouth every 6 (six) hours as needed. 20 tablet 0   lamoTRIgine (LAMICTAL) 25 MG tablet TAKE 1 TABLET BY MOUTH TWICE A DAY 180 tablet 1   propranolol (INDERAL) 20 MG tablet Take 1 tablet (20 mg total) by mouth 2 (two) times daily. 60 tablet 11   SUMAtriptan (IMITREX) 100 MG tablet TAKE 1 TAB BY MOUTH DAILY AS NEEDED FOR MIGRAINE MAY REPEAT IN 2HOURS IF HEADACHE PERSISTS OR RECURS 12 tablet 5   No current facility-administered medications on file prior to visit.     ALLERGIES: Allergies  Allergen Reactions   Codeine Anaphylaxis   Hydrocodone Shortness Of Breath   Penicillins Anaphylaxis    As a child Has patient had a PCN reaction causing immediate rash, facial/tongue/throat swelling, SOB or lightheadedness with hypotension: yes Has patient had a PCN reaction causing severe rash involving mucus membranes or skin necrosis: unknown Has patient had a PCN reaction that required hospitalization: yes Has patient had a PCN reaction occurring within the last 10 years: no If all of the above answers are "NO", then may proceed with Cephalosporin use.    Pholcodine Other (See Comments)   Sulfa Antibiotics Anaphylaxis and Rash   Tramadol Shortness Of Breath   Clindamycin/Lincomycin     rush   Effexor [Venlafaxine]     Sleepy, heavy arms   Fluoxetine     Feeling worse   Lamictal [Lamotrigine]     depression   Wellbutrin [Bupropion]     HAs, anger    FAMILY HISTORY: Family History  Problem Relation Age of Onset   Arthritis Mother    Lymphoma  Mother    Colon cancer Sister 85   Mental illness Daughter    Mental illness Maternal Aunt    Arthritis Maternal Grandmother    Hypertension Maternal Grandfather       Objective:  Blood pressure 128/82, pulse 68, height 5\' 3"  (1.6 m), weight 144 lb (65.3 kg), SpO2 98 %. General: No acute distress.  Patient appears well-groomed.     Shon Millet, DO  CC: Jacinta Shoe, MD

## 2022-06-25 ENCOUNTER — Ambulatory Visit: Payer: Managed Care, Other (non HMO) | Admitting: Neurology

## 2022-06-25 ENCOUNTER — Encounter: Payer: Self-pay | Admitting: Neurology

## 2022-06-25 VITALS — BP 128/82 | HR 68 | Ht 63.0 in | Wt 144.0 lb

## 2022-06-25 DIAGNOSIS — G43009 Migraine without aura, not intractable, without status migrainosus: Secondary | ICD-10-CM

## 2022-06-25 MED ORDER — EMGALITY 120 MG/ML ~~LOC~~ SOAJ: 240.0000 mg | Freq: Once | SUBCUTANEOUS | 0 refills | Status: AC

## 2022-06-25 MED ORDER — SUMATRIPTAN SUCCINATE 100 MG PO TABS: ORAL_TABLET | ORAL | 5 refills | Status: DC

## 2022-06-25 NOTE — Patient Instructions (Signed)
Start Emgality - first dose 2 injections then 1 injection every 28 days thereafter.  Once you pick up the first dose, contact me and I will send in prescription for standing order Sumatriptan as needed.  Limit use of pain relievers to no more than 2 days out of week to prevent risk of rebound or medication-overuse headache. Keep headache diary Follow up in 6 months.

## 2022-06-27 ENCOUNTER — Other Ambulatory Visit: Payer: Self-pay | Admitting: Internal Medicine

## 2022-06-27 ENCOUNTER — Telehealth: Payer: Self-pay

## 2022-06-27 NOTE — Telephone Encounter (Signed)
PA needed for Emgality 120 mg 

## 2022-06-28 ENCOUNTER — Telehealth: Payer: Self-pay | Admitting: Pharmacy Technician

## 2022-06-28 ENCOUNTER — Other Ambulatory Visit (HOSPITAL_COMMUNITY): Payer: Self-pay

## 2022-06-28 NOTE — Telephone Encounter (Signed)
Patient Advocate Encounter   Received notification that prior authorization for Emgality 120MG /ML auto-injectors (migraine) is required.   PA submitted on 06/28/2022 Key [id number] [employer] Electronic PA Form Status is pending

## 2022-06-28 NOTE — Telephone Encounter (Signed)
Status of PA or in separate encounter

## 2022-07-01 ENCOUNTER — Encounter: Payer: Self-pay | Admitting: Internal Medicine

## 2022-07-16 ENCOUNTER — Encounter: Payer: Self-pay | Admitting: Internal Medicine

## 2022-07-16 ENCOUNTER — Ambulatory Visit: Payer: Managed Care, Other (non HMO) | Admitting: Internal Medicine

## 2022-07-16 VITALS — BP 130/70 | HR 70 | Temp 98.4°F | Ht 63.0 in | Wt 140.0 lb

## 2022-07-16 DIAGNOSIS — K439 Ventral hernia without obstruction or gangrene: Secondary | ICD-10-CM | POA: Insufficient documentation

## 2022-07-16 DIAGNOSIS — N951 Menopausal and female climacteric states: Secondary | ICD-10-CM

## 2022-07-16 DIAGNOSIS — F419 Anxiety disorder, unspecified: Secondary | ICD-10-CM

## 2022-07-16 MED ORDER — DESVENLAFAXINE SUCCINATE ER 100 MG PO TB24: 100.0000 mg | ORAL_TABLET | Freq: Every day | ORAL | 3 refills | Status: DC

## 2022-07-16 NOTE — Assessment & Plan Note (Signed)
Will increase Pristiq to 100 mg/d if tolerated

## 2022-07-16 NOTE — Assessment & Plan Note (Signed)
Can try [employer] 

## 2022-07-16 NOTE — Progress Notes (Signed)
Subjective:  Patient ID: Shelly Green, female    DOB: May 21, 1960  Age: 62 y.o. MRN: [id number]  CC: Hernia   HPI Shelly Green presents for abd hernia - worse. F/u ADD, depression, HAs On Pristiq - would like to try a higher dose...  Outpatient Medications Prior to Visit  Medication Sig Dispense Refill   amphetamine-dextroamphetamine (ADDERALL XR) 30 MG 24 hr capsule Take 1 capsule (30 mg total) by mouth every morning. 30 capsule 0   amphetamine-dextroamphetamine (ADDERALL XR) 30 MG 24 hr capsule Take 1 capsule (30 mg total) by mouth daily. 30 capsule 0   amphetamine-dextroamphetamine (ADDERALL XR) 30 MG 24 hr capsule Take 1 capsule (30 mg total) by mouth every morning. 30 capsule 0   Cholecalciferol (VITAMIN D3) 50 MCG (2000 UT) capsule Take 1 capsule (2,000 Units total) by mouth daily. 100 capsule 3   EMGALITY 120 MG/ML SOAJ Inject into the skin.     Fezolinetant (VEOZAH) 45 MG TABS Take 1 tablet (45 mg total) by mouth daily. 30 tablet 5   hydrOXYzine (ATARAX) 25 MG tablet Take 1 tablet (25 mg total) by mouth every 8 (eight) hours as needed. for anxiety Annual appt is due must see provider for future refills 60 tablet 0   ibuprofen (ADVIL) 400 MG tablet Take 1 tablet (400 mg total) by mouth every 6 (six) hours as needed. 20 tablet 0   lamoTRIgine (LAMICTAL) 25 MG tablet TAKE 1 TABLET BY MOUTH TWICE A DAY 180 tablet 1   propranolol (INDERAL) 20 MG tablet Take 1 tablet (20 mg total) by mouth 2 (two) times daily. 60 tablet 11   SUMAtriptan (IMITREX) 100 MG tablet TAKE 1 TAB BY MOUTH DAILY AS NEEDED FOR MIGRAINE MAY REPEAT IN 2HOURS IF HEADACHE PERSISTS OR RECURS 12 tablet 5   desvenlafaxine (PRISTIQ) 50 MG 24 hr tablet TAKE 1 TABLET BY MOUTH EVERY DAY 90 tablet 1   No facility-administered medications prior to visit.    ROS: Review of Systems  Constitutional:  Positive for diaphoresis. Negative for activity change, appetite change, chills, fatigue and unexpected weight change.   HENT:  Negative for congestion, mouth sores and sinus pressure.   Eyes:  Negative for visual disturbance.  Respiratory:  Negative for cough and chest tightness.   Gastrointestinal:  Positive for abdominal pain. Negative for nausea.  Genitourinary:  Negative for difficulty urinating, frequency and vaginal pain.  Musculoskeletal:  Negative for back pain and gait problem.  Skin:  Negative for pallor and rash.  Neurological:  Negative for dizziness, tremors, weakness, numbness and headaches.  Psychiatric/Behavioral:  Positive for decreased concentration and dysphoric mood. Negative for confusion, sleep disturbance and suicidal ideas.     Objective:  BP 130/70 (BP Location: Right Arm, Patient Position: Sitting, Cuff Size: Large)   Pulse 70   Temp 98.4 F (36.9 C) (Oral)   Ht 5\' 3"  (1.6 m)   Wt 140 lb (63.5 kg)   SpO2 97%   BMI 24.80 kg/m   BP Readings from Last 3 Encounters:  07/16/22 130/70  06/25/22 128/82  05/15/22 128/80    Wt Readings from Last 3 Encounters:  07/16/22 140 lb (63.5 kg)  06/25/22 144 lb (65.3 kg)  05/15/22 143 lb (64.9 kg)    Physical Exam Constitutional:      General: She is not in acute distress.    Appearance: Normal appearance. She is well-developed.  HENT:     Head: Normocephalic.     Right Ear: External ear  normal.     Left Ear: External ear normal.     Nose: Nose normal.  Eyes:     General:        Right eye: No discharge.        Left eye: No discharge.     Conjunctiva/sclera: Conjunctivae normal.     Pupils: Pupils are equal, round, and reactive to light.  Neck:     Thyroid: No thyromegaly.     Vascular: No JVD.     Trachea: No tracheal deviation.  Cardiovascular:     Rate and Rhythm: Normal rate and regular rhythm.     Heart sounds: Normal heart sounds.  Pulmonary:     Effort: No respiratory distress.     Breath sounds: No stridor. No wheezing.  Abdominal:     General: Bowel sounds are normal. There is no distension.      Palpations: Abdomen is soft. There is no mass.     Tenderness: There is no abdominal tenderness. There is no guarding or rebound.     Hernia: A hernia is present.  Musculoskeletal:        General: No tenderness.     Cervical back: Normal range of motion and neck supple. No rigidity.  Lymphadenopathy:     Cervical: No cervical adenopathy.  Skin:    Findings: No erythema or rash.  Neurological:     Cranial Nerves: No cranial nerve deficit.     Motor: No abnormal muscle tone.     Coordination: Coordination normal.     Deep Tendon Reflexes: Reflexes normal.  Psychiatric:        Behavior: Behavior normal.        Thought Content: Thought content normal.        Judgment: Judgment normal.    Ventral hernia 4x4 cm to the L from the umbilicus  Lab Results  Component Value Date   WBC 8.3 07/23/2021   HGB 14.5 07/23/2021   HCT 42.2 07/23/2021   PLT 215 07/23/2021   GLUCOSE 119 (H) 07/23/2021   CHOL 145 12/29/2018   TRIG 62.0 12/29/2018   HDL 47.70 12/29/2018   LDLCALC 85 12/29/2018   ALT 14 05/15/2020   AST 16 05/15/2020   NA 139 07/23/2021   K 3.5 07/23/2021   CL 104 07/23/2021   CREATININE 0.59 07/23/2021   BUN 10 07/23/2021   CO2 25 07/23/2021   TSH 2.64 12/29/2018   INR 1.02 09/28/2014    DG Foot Complete Right  Result Date: 08/09/2021 CLINICAL DATA:  Swelling and pain, history of RIGHT ankle injury. EXAM: RIGHT FOOT COMPLETE - 3+ VIEW COMPARISON:  Ankle evaluation of the same date. FINDINGS: Abundant soft tissue swelling over the lateral aspect of the foot and ankle mainly about the hindfoot and ankle. Swelling over the dorsum of the midfoot as well. Avulsion fracture from the navicular on lateral projection. Also seen on ankle radiographs. IMPRESSION: Avulsion fracture from the navicular with extensive soft tissue swelling over the dorsum of the midfoot and lateral aspect of the foot and ankle. Electronically Signed   By: Donzetta Kohut M.D.   On: 08/09/2021 15:53   DG  Ankle Complete Right  Result Date: 08/09/2021 CLINICAL DATA:  Swelling and pain. Complains of right ankle injury after stepping in hole rolling ankle. EXAM: RIGHT ANKLE - COMPLETE 3+ VIEW COMPARISON:  None Available. FINDINGS: There is marked lateral and dorsal soft tissue swelling overlying the ankle. Avulsion fracture is identified along the dorsal surface of the  navicular bone. No additional fracture identified. IMPRESSION: 1. Avulsion fracture arising from the dorsal surface of the navicular bone. 2. Marked lateral and dorsal soft tissue swelling. Electronically Signed   By: Signa Kell M.D.   On: 08/09/2021 15:51    Assessment & Plan:   Problem List Items Addressed This Visit     Menopause syndrome    Can try Veozah      Anxiety disorder    Will increase Pristiq to 100 mg/d if tolerated      Relevant Medications   desvenlafaxine (PRISTIQ) 100 MG 24 hr tablet   Ventral hernia - Primary    Ventral hernia 4x4 cm to the L from the umbilicus - worse Surg ref Dr Fredricka Bonine      Relevant Orders   Ambulatory referral to General Surgery      Meds ordered this encounter  Medications   desvenlafaxine (PRISTIQ) 100 MG 24 hr tablet    Sig: Take 1 tablet (100 mg total) by mouth daily.    Dispense:  90 tablet    Refill:  3      Follow-up: No follow-ups on file.  [name], MD

## 2022-07-16 NOTE — Assessment & Plan Note (Signed)
Ventral hernia 4x4 cm to the L from the umbilicus - worse Surg ref Dr Fredricka Bonine

## 2022-07-21 ENCOUNTER — Other Ambulatory Visit: Payer: Self-pay | Admitting: Internal Medicine

## 2022-07-22 ENCOUNTER — Telehealth: Payer: Self-pay | Admitting: Neurology

## 2022-07-22 MED ORDER — EMGALITY 120 MG/ML ~~LOC~~ SOAJ: 120.0000 mg | SUBCUTANEOUS | 11 refills | Status: DC

## 2022-07-22 NOTE — Telephone Encounter (Signed)
Pt is calling in stating that she took the first 2 doses of Rx Emaglity on Saturday.  Pharm:  [facility] [address].

## 2022-07-22 NOTE — Telephone Encounter (Signed)
LMOVm advising refills sent to [address] [facility].

## 2022-07-22 NOTE — Telephone Encounter (Signed)
Pt is calling in stating that she took her 2 doses of Rx Emaglity on Saturday.  Pharm: [facility] [address].

## 2022-07-29 ENCOUNTER — Telehealth: Payer: Self-pay | Admitting: *Deleted

## 2022-07-29 NOTE — Telephone Encounter (Signed)
Rec'd fax pt need PA on Vraylar. Submitted w/  (Key: [id number]) PA sent to [facility].Marland KitchenRaechel Chute

## 2022-07-30 NOTE — Telephone Encounter (Signed)
Check status on PA... it states "The drug you are requesting prior authorization for is not covered. Please select an alternative drug from the choices provided and send in a new prescription for that drug. Alternatives Risperidone 0.25, 0.5, 1,2,3 and 4mg  and Olanzapine 5 mg & 7.5 mg pls advise.Marland KitchenRaechel Chute

## 2022-07-30 NOTE — Telephone Encounter (Signed)
Pharmacy Patient Advocate Encounter  Received notification from [employer] that Prior Authorization for Emgality 120MG /ML auto-injectors (migraine  has been APPROVED from 06/28/2022 to 12/28/2022.Marland Kitchen  PA #/Case ID/Reference #: [id number]

## 2022-08-12 ENCOUNTER — Other Ambulatory Visit: Payer: Self-pay | Admitting: Surgery

## 2022-08-12 DIAGNOSIS — K439 Ventral hernia without obstruction or gangrene: Secondary | ICD-10-CM

## 2022-08-13 NOTE — Telephone Encounter (Signed)
Called pt no answer LMOM w/MD response. Pt has appt tomorrow inform can discuss tomorrow at ov.Marland KitchenRaechel Chute

## 2022-08-13 NOTE — Telephone Encounter (Signed)
Noted.  Is Shelly Green willing to try olanzapine?  Thanks

## 2022-08-14 ENCOUNTER — Ambulatory Visit: Payer: Managed Care, Other (non HMO) | Admitting: Internal Medicine

## 2022-08-14 ENCOUNTER — Encounter: Payer: Self-pay | Admitting: Internal Medicine

## 2022-08-14 VITALS — BP 118/68 | HR 68 | Temp 97.9°F | Ht 63.0 in | Wt 142.0 lb

## 2022-08-14 DIAGNOSIS — G43109 Migraine with aura, not intractable, without status migrainosus: Secondary | ICD-10-CM | POA: Diagnosis not present

## 2022-08-14 DIAGNOSIS — I10 Essential (primary) hypertension: Secondary | ICD-10-CM | POA: Diagnosis not present

## 2022-08-14 DIAGNOSIS — N951 Menopausal and female climacteric states: Secondary | ICD-10-CM | POA: Diagnosis not present

## 2022-08-14 DIAGNOSIS — F419 Anxiety disorder, unspecified: Secondary | ICD-10-CM

## 2022-08-14 MED ORDER — OLANZAPINE 2.5 MG PO TABS: 2.5000 mg | ORAL_TABLET | Freq: Every day | ORAL | 5 refills | Status: DC

## 2022-08-14 MED ORDER — AMPHETAMINE-DEXTROAMPHET ER 30 MG PO CP24: 30.0000 mg | ORAL_CAPSULE | ORAL | 0 refills | Status: DC

## 2022-08-14 MED ORDER — AMPHETAMINE-DEXTROAMPHET ER 30 MG PO CP24: 30.0000 mg | ORAL_CAPSULE | Freq: Every day | ORAL | 0 refills | Status: DC

## 2022-08-14 NOTE — Assessment & Plan Note (Signed)
On Veosah x 1 wk, no help yet

## 2022-08-14 NOTE — Assessment & Plan Note (Signed)
On Ajovy - doing well

## 2022-08-14 NOTE — Progress Notes (Signed)
Subjective:  Patient ID: Shelly Green, female    DOB: 09/30/1960  Age: 62 y.o. MRN: [id number]  CC: Follow-up (3 mnth f/u)   HPI ALBERTINE LAFOY presents for HAs, hot flashes, depression Vraylar is not covered  Outpatient Medications Prior to Visit  Medication Sig Dispense Refill   Cholecalciferol (VITAMIN D3) 50 MCG (2000 UT) capsule Take 1 capsule (2,000 Units total) by mouth daily. 100 capsule 3   desvenlafaxine (PRISTIQ) 100 MG 24 hr tablet Take 1 tablet (100 mg total) by mouth daily. 90 tablet 3   EMGALITY 120 MG/ML SOAJ Inject into the skin.     Fezolinetant (VEOZAH) 45 MG TABS Take 1 tablet (45 mg total) by mouth daily. 30 tablet 5   Galcanezumab-gnlm (EMGALITY) 120 MG/ML SOAJ Inject 120 mg into the skin every 28 (twenty-eight) days. 1.12 mL 11   hydrOXYzine (ATARAX) 25 MG tablet Take 1 tablet (25 mg total) by mouth every 8 (eight) hours as needed. for anxiety Annual appt is due must see provider for future refills 60 tablet 0   ibuprofen (ADVIL) 400 MG tablet Take 1 tablet (400 mg total) by mouth every 6 (six) hours as needed. 20 tablet 0   lamoTRIgine (LAMICTAL) 25 MG tablet TAKE 1 TABLET BY MOUTH TWICE A DAY 180 tablet 1   propranolol (INDERAL) 20 MG tablet TAKE 1 TABLET BY MOUTH TWICE A DAY 180 tablet 3   SUMAtriptan (IMITREX) 100 MG tablet TAKE 1 TAB BY MOUTH DAILY AS NEEDED FOR MIGRAINE MAY REPEAT IN 2HOURS IF HEADACHE PERSISTS OR RECURS 12 tablet 5   amphetamine-dextroamphetamine (ADDERALL XR) 30 MG 24 hr capsule Take 1 capsule (30 mg total) by mouth every morning. 30 capsule 0   amphetamine-dextroamphetamine (ADDERALL XR) 30 MG 24 hr capsule Take 1 capsule (30 mg total) by mouth daily. 30 capsule 0   amphetamine-dextroamphetamine (ADDERALL XR) 30 MG 24 hr capsule Take 1 capsule (30 mg total) by mouth every morning. 30 capsule 0   No facility-administered medications prior to visit.    ROS: Review of Systems  Constitutional:  Negative for activity change, appetite  change, chills, fatigue and unexpected weight change.  HENT:  Negative for congestion, mouth sores and sinus pressure.   Eyes:  Negative for visual disturbance.  Respiratory:  Negative for cough and chest tightness.   Gastrointestinal:  Negative for abdominal pain and nausea.  Genitourinary:  Negative for difficulty urinating, frequency and vaginal pain.  Musculoskeletal:  Negative for back pain and gait problem.  Skin:  Negative for pallor and rash.  Neurological:  Negative for dizziness, tremors, weakness, numbness and headaches.  Psychiatric/Behavioral:  Negative for confusion, sleep disturbance and suicidal ideas.     Objective:  BP 118/68 (BP Location: Left Arm, Patient Position: Sitting, Cuff Size: Large)   Pulse 68   Temp 97.9 F (36.6 C) (Oral)   Ht 5\' 3"  (1.6 m)   Wt 142 lb (64.4 kg)   SpO2 95%   BMI 25.15 kg/m   BP Readings from Last 3 Encounters:  08/14/22 118/68  07/16/22 130/70  06/25/22 128/82    Wt Readings from Last 3 Encounters:  08/14/22 142 lb (64.4 kg)  07/16/22 140 lb (63.5 kg)  06/25/22 144 lb (65.3 kg)    Physical Exam Constitutional:      General: She is not in acute distress.    Appearance: Normal appearance. She is well-developed.  HENT:     Head: Normocephalic.     Right Ear: External ear  normal.     Left Ear: External ear normal.     Nose: Nose normal.  Eyes:     General:        Right eye: No discharge.        Left eye: No discharge.     Conjunctiva/sclera: Conjunctivae normal.     Pupils: Pupils are equal, round, and reactive to light.  Neck:     Thyroid: No thyromegaly.     Vascular: No JVD.     Trachea: No tracheal deviation.  Cardiovascular:     Rate and Rhythm: Normal rate and regular rhythm.     Heart sounds: Normal heart sounds.  Pulmonary:     Effort: No respiratory distress.     Breath sounds: No stridor. No wheezing.  Abdominal:     General: Bowel sounds are normal. There is no distension.     Palpations: Abdomen is  soft. There is no mass.     Tenderness: There is no abdominal tenderness. There is no guarding or rebound.  Musculoskeletal:        General: No tenderness.     Cervical back: Normal range of motion and neck supple. No rigidity.  Lymphadenopathy:     Cervical: No cervical adenopathy.  Skin:    Findings: No erythema or rash.  Neurological:     Mental Status: She is oriented to person, place, and time.     Cranial Nerves: No cranial nerve deficit.     Motor: No abnormal muscle tone.     Coordination: Coordination normal.     Deep Tendon Reflexes: Reflexes normal.  Psychiatric:        Behavior: Behavior normal.        Thought Content: Thought content normal.        Judgment: Judgment normal.     Lab Results  Component Value Date   WBC 8.3 07/23/2021   HGB 14.5 07/23/2021   HCT 42.2 07/23/2021   PLT 215 07/23/2021   GLUCOSE 119 (H) 07/23/2021   CHOL 145 12/29/2018   TRIG 62.0 12/29/2018   HDL 47.70 12/29/2018   LDLCALC 85 12/29/2018   ALT 14 05/15/2020   AST 16 05/15/2020   NA 139 07/23/2021   K 3.5 07/23/2021   CL 104 07/23/2021   CREATININE 0.59 07/23/2021   BUN 10 07/23/2021   CO2 25 07/23/2021   TSH 2.64 12/29/2018   INR 1.02 09/28/2014    DG Foot Complete Right  Result Date: 08/09/2021 CLINICAL DATA:  Swelling and pain, history of RIGHT ankle injury. EXAM: RIGHT FOOT COMPLETE - 3+ VIEW COMPARISON:  Ankle evaluation of the same date. FINDINGS: Abundant soft tissue swelling over the lateral aspect of the foot and ankle mainly about the hindfoot and ankle. Swelling over the dorsum of the midfoot as well. Avulsion fracture from the navicular on lateral projection. Also seen on ankle radiographs. IMPRESSION: Avulsion fracture from the navicular with extensive soft tissue swelling over the dorsum of the midfoot and lateral aspect of the foot and ankle. Electronically Signed   By: Donzetta Kohut M.D.   On: 08/09/2021 15:53   DG Ankle Complete Right  Result Date:  08/09/2021 CLINICAL DATA:  Swelling and pain. Complains of right ankle injury after stepping in hole rolling ankle. EXAM: RIGHT ANKLE - COMPLETE 3+ VIEW COMPARISON:  None Available. FINDINGS: There is marked lateral and dorsal soft tissue swelling overlying the ankle. Avulsion fracture is identified along the dorsal surface of the navicular bone. No additional fracture  identified. IMPRESSION: 1. Avulsion fracture arising from the dorsal surface of the navicular bone. 2. Marked lateral and dorsal soft tissue swelling. Electronically Signed   By: Signa Kell M.D.   On: 08/09/2021 15:51    Assessment & Plan:   Problem List Items Addressed This Visit     Menopause syndrome - Primary    On Veosah x 1 wk, no help yet      Migraine headache with aura    On Ajovy - doing well      Anxiety disorder    On Pristiq      HTN (hypertension)    Start Propranolol bid         Meds ordered this encounter  Medications   OLANZapine (ZYPREXA) 2.5 MG tablet    Sig: Take 1 tablet (2.5 mg total) by mouth at bedtime.    Dispense:  30 tablet    Refill:  5   amphetamine-dextroamphetamine (ADDERALL XR) 30 MG 24 hr capsule    Sig: Take 1 capsule (30 mg total) by mouth every morning.    Dispense:  30 capsule    Refill:  0    Please fill on or after 08/11/22   amphetamine-dextroamphetamine (ADDERALL XR) 30 MG 24 hr capsule    Sig: Take 1 capsule (30 mg total) by mouth daily.    Dispense:  30 capsule    Refill:  0    Please fill on or after 09/10/22   amphetamine-dextroamphetamine (ADDERALL XR) 30 MG 24 hr capsule    Sig: Take 1 capsule (30 mg total) by mouth every morning.    Dispense:  30 capsule    Refill:  0    Please fill on or after 10/10/22      Follow-up: No follow-ups on file.  [name], MD

## 2022-08-14 NOTE — Assessment & Plan Note (Signed)
On Pristiq 

## 2022-08-14 NOTE — Assessment & Plan Note (Signed)
Start Propranolol bid

## 2022-08-20 ENCOUNTER — Other Ambulatory Visit: Payer: Self-pay | Admitting: Internal Medicine

## 2022-09-02 ENCOUNTER — Other Ambulatory Visit: Payer: Self-pay | Admitting: Neurology

## 2022-09-11 ENCOUNTER — Ambulatory Visit
Admission: RE | Admit: 2022-09-11 | Discharge: 2022-09-11 | Disposition: A | Discharge status: Home / Self Care | Payer: Managed Care, Other (non HMO) | Source: Ambulatory Visit | Attending: Surgery | Admitting: Surgery

## 2022-09-11 DIAGNOSIS — K439 Ventral hernia without obstruction or gangrene: Secondary | ICD-10-CM

## 2022-09-12 ENCOUNTER — Ambulatory Visit
Admission: RE | Admit: 2022-09-12 | Discharge: 2022-09-12 | Disposition: A | Discharge status: Home / Self Care | Payer: Managed Care, Other (non HMO) | Source: Ambulatory Visit | Attending: Surgery | Admitting: Surgery

## 2022-09-12 ENCOUNTER — Other Ambulatory Visit: Payer: Managed Care, Other (non HMO)

## 2022-09-12 MED ORDER — IOPAMIDOL (ISOVUE-300) INJECTION 61%
100.0000 mL | Freq: Once | INTRAVENOUS | Status: AC | PRN
  Administered 2022-09-12: 100 mL via INTRAVENOUS

## 2022-10-02 ENCOUNTER — Encounter: Payer: Self-pay | Admitting: Internal Medicine

## 2022-10-02 ENCOUNTER — Ambulatory Visit: Payer: Managed Care, Other (non HMO) | Admitting: Internal Medicine

## 2022-10-02 VITALS — BP 128/88 | HR 82 | Temp 98.2°F | Ht 63.0 in | Wt 143.6 lb

## 2022-10-02 DIAGNOSIS — F39 Unspecified mood [affective] disorder: Secondary | ICD-10-CM

## 2022-10-02 DIAGNOSIS — T451X5A Adverse effect of antineoplastic and immunosuppressive drugs, initial encounter: Secondary | ICD-10-CM

## 2022-10-02 DIAGNOSIS — I10 Essential (primary) hypertension: Secondary | ICD-10-CM

## 2022-10-02 DIAGNOSIS — R232 Flushing: Secondary | ICD-10-CM

## 2022-10-02 DIAGNOSIS — G43109 Migraine with aura, not intractable, without status migrainosus: Secondary | ICD-10-CM

## 2022-10-02 DIAGNOSIS — C50919 Malignant neoplasm of unspecified site of unspecified female breast: Secondary | ICD-10-CM | POA: Diagnosis not present

## 2022-10-02 DIAGNOSIS — F339 Major depressive disorder, recurrent, unspecified: Secondary | ICD-10-CM

## 2022-10-02 MED ORDER — AMPHETAMINE-DEXTROAMPHET ER 30 MG PO CP24: 30.0000 mg | ORAL_CAPSULE | Freq: Every day | ORAL | 0 refills | Status: DC

## 2022-10-02 MED ORDER — HYOSCYAMINE SULFATE 0.125 MG PO TABS: 0.1250 mg | ORAL_TABLET | Freq: Four times a day (QID) | ORAL | 3 refills | Status: DC | PRN

## 2022-10-02 MED ORDER — AMPHETAMINE-DEXTROAMPHET ER 30 MG PO CP24: 30.0000 mg | ORAL_CAPSULE | ORAL | 0 refills | Status: DC

## 2022-10-02 MED ORDER — ARIPIPRAZOLE 2 MG PO TABS: 2.0000 mg | ORAL_TABLET | Freq: Every day | ORAL | 2 refills | Status: DC

## 2022-10-02 NOTE — Assessment & Plan Note (Signed)
Tamoxifen d/c

## 2022-10-02 NOTE — Assessment & Plan Note (Signed)
On Ajovy - d/c  On Emgality

## 2022-10-02 NOTE — Assessment & Plan Note (Signed)
Will try levsin prn

## 2022-10-02 NOTE — Progress Notes (Signed)
Subjective:  Patient ID: Shelly Green, female    DOB: October 08, 1960  Age: 62 y.o. MRN: [id number]  CC: No chief complaint on file.   HPI Shelly Green presents for hot flashes, mood disorder, HAs   Outpatient Medications Prior to Visit  Medication Sig Dispense Refill   amphetamine-dextroamphetamine (ADDERALL XR) 30 MG 24 hr capsule Take 1 capsule (30 mg total) by mouth every morning. 30 capsule 0   Cholecalciferol (VITAMIN D3) 50 MCG (2000 UT) capsule Take 1 capsule (2,000 Units total) by mouth daily. 100 capsule 3   desvenlafaxine (PRISTIQ) 100 MG 24 hr tablet Take 1 tablet (100 mg total) by mouth daily. 90 tablet 3   EMGALITY 120 MG/ML SOAJ Inject into the skin.     Galcanezumab-gnlm (EMGALITY) 120 MG/ML SOAJ Inject 120 mg into the skin every 28 (twenty-eight) days. 1.12 mL 11   hydrOXYzine (ATARAX) 25 MG tablet Take 1 tablet (25 mg total) by mouth every 8 (eight) hours as needed. 60 tablet 2   ibuprofen (ADVIL) 400 MG tablet Take 1 tablet (400 mg total) by mouth every 6 (six) hours as needed. 20 tablet 0   lamoTRIgine (LAMICTAL) 25 MG tablet TAKE 1 TABLET BY MOUTH TWICE A DAY 180 tablet 1   propranolol (INDERAL) 20 MG tablet TAKE 1 TABLET BY MOUTH TWICE A DAY 180 tablet 3   SUMAtriptan (IMITREX) 100 MG tablet TAKE 1 TAB BY MOUTH DAILY AS NEEDED FOR MIGRAINE MAY REPEAT IN 2HOURS IF HEADACHE PERSISTS OR RECURS 12 tablet 5   amphetamine-dextroamphetamine (ADDERALL XR) 30 MG 24 hr capsule Take 1 capsule (30 mg total) by mouth every morning. (Patient not taking: Reported on 10/02/2022) 30 capsule 0   amphetamine-dextroamphetamine (ADDERALL XR) 30 MG 24 hr capsule Take 1 capsule (30 mg total) by mouth daily. (Patient not taking: Reported on 10/02/2022) 30 capsule 0   Fezolinetant (VEOZAH) 45 MG TABS Take 1 tablet (45 mg total) by mouth daily. (Patient not taking: Reported on 10/02/2022) 30 tablet 5   OLANZapine (ZYPREXA) 2.5 MG tablet Take 1 tablet (2.5 mg total) by mouth at bedtime. (Patient  not taking: Reported on 10/02/2022) 30 tablet 5   No facility-administered medications prior to visit.    ROS: Review of Systems  Objective:  BP 128/88 (BP Location: Left Arm, Patient Position: Sitting, Cuff Size: Normal)   Pulse 82   Temp 98.2 F (36.8 C) (Oral)   Ht 5\' 3"  (1.6 m)   Wt 143 lb 9.6 oz (65.1 kg)   SpO2 95%   BMI 25.44 kg/m   BP Readings from Last 3 Encounters:  10/02/22 128/88  08/14/22 118/68  07/16/22 130/70    Wt Readings from Last 3 Encounters:  10/02/22 143 lb 9.6 oz (65.1 kg)  08/14/22 142 lb (64.4 kg)  07/16/22 140 lb (63.5 kg)    Physical Exam  Lab Results  Component Value Date   WBC 8.3 07/23/2021   HGB 14.5 07/23/2021   HCT 42.2 07/23/2021   PLT 215 07/23/2021   GLUCOSE 119 (H) 07/23/2021   CHOL 145 12/29/2018   TRIG 62.0 12/29/2018   HDL 47.70 12/29/2018   LDLCALC 85 12/29/2018   ALT 14 05/15/2020   AST 16 05/15/2020   NA 139 07/23/2021   K 3.5 07/23/2021   CL 104 07/23/2021   CREATININE 0.59 07/23/2021   BUN 10 07/23/2021   CO2 25 07/23/2021   TSH 2.64 12/29/2018   INR 1.02 09/28/2014    CT ABDOMEN PELVIS W  CONTRAST  Result Date: 09/18/2022 CLINICAL DATA:  History of ventral hernia. Soreness, tenderness on side EXAM: CT ABDOMEN AND PELVIS WITH CONTRAST TECHNIQUE: Multidetector CT imaging of the abdomen and pelvis was performed using the standard protocol following bolus administration of intravenous contrast. RADIATION DOSE REDUCTION: This exam was performed according to the departmental dose-optimization program which includes automated exposure control, adjustment of the mA and/or kV according to patient size and/or use of iterative reconstruction technique. CONTRAST:  ISOVUE-300 IOPAMIDOL (ISOVUE-300) INJECTION 61% COMPARISON:  09/28/2014 FINDINGS: Lower chest: No acute abnormality Hepatobiliary: No focal liver abnormality is seen. Status post cholecystectomy. No biliary dilatation. Pancreas: No focal abnormality or ductal  dilatation. Spleen: No focal abnormality.  Normal size. Adrenals/Urinary Tract: No adrenal abnormality. No focal renal abnormality. No stones or hydronephrosis. Urinary bladder is unremarkable. Stomach/Bowel: Stomach, large and small bowel grossly unremarkable. Vascular/Lymphatic: Scattered aortic atherosclerosis. No evidence of aneurysm or adenopathy. Reproductive: Prior hysterectomy.  No adnexal masses. Other: No free fluid or free air. Small supraumbilical ventral hernia containing fat, stable. Musculoskeletal: No acute bony abnormality. IMPRESSION: Small supraumbilical ventral hernia containing fat, stable since prior study. No new or additional abdominal hernia. No acute findings in the abdomen or pelvis. Electronically Signed   By: Charlett Nose M.D.   On: 09/18/2022 10:07    Assessment & Plan:   Problem List Items Addressed This Visit     Migraine headache with aura - Primary    On Ajovy - d/c  On Emgality      Breast cancer (HCC)    Tamoxifen d/c      Depression    On Effexor XR      Hot flashes related to aromatase inhibitor therapy    Will try levsin prn      Mood disorder (HCC)    D/c Olanzapine Start Abilify      HTN (hypertension)    BP Readings from Last 3 Encounters:  10/02/22 128/88  08/14/22 118/68  07/16/22 130/70            Meds ordered this encounter  Medications   ARIPiprazole (ABILIFY) 2 MG tablet    Sig: Take 1 tablet (2 mg total) by mouth daily.    Dispense:  30 tablet    Refill:  2   amphetamine-dextroamphetamine (ADDERALL XR) 30 MG 24 hr capsule    Sig: Take 1 capsule (30 mg total) by mouth every morning.    Dispense:  30 capsule    Refill:  0    Please fill on or after 11/09/22   amphetamine-dextroamphetamine (ADDERALL XR) 30 MG 24 hr capsule    Sig: Take 1 capsule (30 mg total) by mouth daily.    Dispense:  30 capsule    Refill:  0    Please fill on or after 12/09/22   hyoscyamine (LEVSIN) 0.125 MG tablet    Sig: Take 1-2 tablets  (0.125-0.25 mg total) by mouth every 6 (six) hours as needed (hot flashes).    Dispense:  120 tablet    Refill:  3      Follow-up: Return in about 3 months (around 01/01/2023) for a follow-up visit.  [name], MD

## 2022-10-02 NOTE — Assessment & Plan Note (Signed)
On Effexor XR 

## 2022-10-02 NOTE — Assessment & Plan Note (Signed)
D/c Olanzapine Start Abilify

## 2022-10-02 NOTE — Assessment & Plan Note (Signed)
BP Readings from Last 3 Encounters:  10/02/22 128/88  08/14/22 118/68  07/16/22 130/70

## 2022-10-10 ENCOUNTER — Other Ambulatory Visit: Payer: Self-pay | Admitting: Internal Medicine

## 2022-10-22 ENCOUNTER — Ambulatory Visit: Payer: Managed Care, Other (non HMO) | Admitting: Internal Medicine

## 2022-10-22 ENCOUNTER — Encounter: Payer: Self-pay | Admitting: Internal Medicine

## 2022-10-22 VITALS — BP 130/70 | HR 65 | Temp 98.6°F | Ht 63.0 in | Wt 143.0 lb

## 2022-10-22 DIAGNOSIS — J441 Chronic obstructive pulmonary disease with (acute) exacerbation: Secondary | ICD-10-CM | POA: Diagnosis not present

## 2022-10-22 DIAGNOSIS — F909 Attention-deficit hyperactivity disorder, unspecified type: Secondary | ICD-10-CM

## 2022-10-22 DIAGNOSIS — F17200 Nicotine dependence, unspecified, uncomplicated: Secondary | ICD-10-CM

## 2022-10-22 DIAGNOSIS — F17210 Nicotine dependence, cigarettes, uncomplicated: Secondary | ICD-10-CM | POA: Diagnosis not present

## 2022-10-22 MED ORDER — AZITHROMYCIN 250 MG PO TABS: ORAL_TABLET | ORAL | 0 refills | Status: DC

## 2022-10-22 MED ORDER — AIRSUPRA 90-80 MCG/ACT IN AERO: 2.0000 | INHALATION_SPRAY | RESPIRATORY_TRACT | 5 refills | Status: DC | PRN

## 2022-10-22 MED ORDER — METHYLPREDNISOLONE 4 MG PO TBPK: ORAL_TABLET | ORAL | 0 refills | Status: DC

## 2022-10-22 MED ORDER — AMPHETAMINE-DEXTROAMPHET ER 30 MG PO CP24: 30.0000 mg | ORAL_CAPSULE | ORAL | 0 refills | Status: DC

## 2022-10-22 NOTE — Assessment & Plan Note (Signed)
Medrol pack, Zpack Aisupra MDI prn  01/2022 chest CT FINDINGS:   LUNG NODULES: 5 x 3 mm solid nodule in the right middle lobe, series 3/118  is unchanged since 01/09/2016, suggesting benign etiology. 6 mm diameter  peripheral nodule in the right lower lobe is also unchanged since 2017.   LUNGS: Upper lobe predominant centrilobular nodularity with associated  bronchial wall thickening suggests respiratory bronchiolitis.   PLEURA: No pleural thickening or effusion.   HEART: Heart size normal. No pericardial effusion.   CORONARY ARTERY CALCIFICATION: Mild.   MEDIASTINUM/HILUM/AXILLA: No adenopathy.

## 2022-10-22 NOTE — Progress Notes (Signed)
Subjective:  Patient ID: Shelly Green, female    DOB: [birth year]  Age: 62 y.o. MRN: [id number]  CC: Cough (Persistent cough a/w hard to breathe with upon laying down)   HPI EMMETT GOLDWASSER presents for a productive cough x 2-3 weeks C/o SOB when laying down; not every night No CP. Smoker <1 PPD   01/2022 chest CT FINDINGS:   LUNG NODULES: 5 x 3 mm solid nodule in the right middle lobe, series 3/118  is unchanged since 01/09/2016, suggesting benign etiology. 6 mm diameter  peripheral nodule in the right lower lobe is also unchanged since 2017.   LUNGS: Upper lobe predominant centrilobular nodularity with associated  bronchial wall thickening suggests respiratory bronchiolitis.   PLEURA: No pleural thickening or effusion.   HEART: Heart size normal. No pericardial effusion.   CORONARY ARTERY CALCIFICATION: Mild.   MEDIASTINUM/HILUM/AXILLA: No adenopathy.    Outpatient Medications Prior to Visit  Medication Sig Dispense Refill   amphetamine-dextroamphetamine (ADDERALL XR) 30 MG 24 hr capsule Take 1 capsule (30 mg total) by mouth every morning. 30 capsule 0   amphetamine-dextroamphetamine (ADDERALL XR) 30 MG 24 hr capsule Take 1 capsule (30 mg total) by mouth daily. 30 capsule 0   ARIPiprazole (ABILIFY) 2 MG tablet Take 1 tablet (2 mg total) by mouth daily. 30 tablet 2   Cholecalciferol (VITAMIN D3) 50 MCG (2000 UT) capsule Take 1 capsule (2,000 Units total) by mouth daily. 100 capsule 3   desvenlafaxine (PRISTIQ) 100 MG 24 hr tablet Take 1 tablet (100 mg total) by mouth daily. 90 tablet 3   EMGALITY 120 MG/ML SOAJ Inject into the skin.     Galcanezumab-gnlm (EMGALITY) 120 MG/ML SOAJ Inject 120 mg into the skin every 28 (twenty-eight) days. 1.12 mL 11   hydrOXYzine (ATARAX) 25 MG tablet Take 1 tablet (25 mg total) by mouth every 8 (eight) hours as needed. 60 tablet 2   hyoscyamine (LEVSIN) 0.125 MG tablet TAKE 1-2 TABLETS (0.125-0.25 MG TOTAL) BY MOUTH EVERY 6 (SIX) HOURS  AS NEEDED (HOT FLASHES). 720 tablet 0   ibuprofen (ADVIL) 400 MG tablet Take 1 tablet (400 mg total) by mouth every 6 (six) hours as needed. 20 tablet 0   lamoTRIgine (LAMICTAL) 25 MG tablet TAKE 1 TABLET BY MOUTH TWICE A DAY 180 tablet 1   propranolol (INDERAL) 20 MG tablet TAKE 1 TABLET BY MOUTH TWICE A DAY 180 tablet 3   SUMAtriptan (IMITREX) 100 MG tablet TAKE 1 TAB BY MOUTH DAILY AS NEEDED FOR MIGRAINE MAY REPEAT IN 2HOURS IF HEADACHE PERSISTS OR RECURS 12 tablet 5   amphetamine-dextroamphetamine (ADDERALL XR) 30 MG 24 hr capsule Take 1 capsule (30 mg total) by mouth every morning. 30 capsule 0   No facility-administered medications prior to visit.    ROS: Review of Systems  Constitutional:  Positive for diaphoresis. Negative for activity change, appetite change, chills, fatigue and unexpected weight change.  HENT:  Negative for congestion, mouth sores and sinus pressure.   Eyes:  Negative for visual disturbance.  Respiratory:  Positive for cough, chest tightness and shortness of breath. Negative for wheezing.   Gastrointestinal:  Negative for abdominal pain and nausea.  Genitourinary:  Negative for difficulty urinating, frequency and vaginal pain.  Musculoskeletal:  Negative for back pain and gait problem.  Skin:  Negative for pallor and rash.  Neurological:  Negative for dizziness, tremors, weakness, numbness and headaches.  Psychiatric/Behavioral:  Negative for confusion and sleep disturbance.     Objective:  BP  130/70 (BP Location: Right Arm, Patient Position: Sitting, Cuff Size: Normal)   Pulse 65   Temp 98.6 F (37 C) (Oral)   Ht 5\' 3"  (1.6 m)   Wt 143 lb (64.9 kg)   SpO2 97%   BMI 25.33 kg/m   BP Readings from Last 3 Encounters:  10/22/22 130/70  10/02/22 128/88  08/14/22 118/68    Wt Readings from Last 3 Encounters:  10/22/22 143 lb (64.9 kg)  10/02/22 143 lb 9.6 oz (65.1 kg)  08/14/22 142 lb (64.4 kg)    Physical Exam Constitutional:      General: She is  not in acute distress.    Appearance: She is well-developed.  HENT:     Head: Normocephalic.     Right Ear: External ear normal.     Left Ear: External ear normal.     Nose: Nose normal.  Eyes:     General:        Right eye: No discharge.        Left eye: No discharge.     Conjunctiva/sclera: Conjunctivae normal.     Pupils: Pupils are equal, round, and reactive to light.  Neck:     Thyroid: No thyromegaly.     Vascular: No JVD.     Trachea: No tracheal deviation.  Cardiovascular:     Rate and Rhythm: Normal rate and regular rhythm.     Heart sounds: Normal heart sounds.  Pulmonary:     Effort: No respiratory distress.     Breath sounds: No stridor. No wheezing.  Abdominal:     General: Bowel sounds are normal. There is no distension.     Palpations: Abdomen is soft. There is no mass.     Tenderness: There is no abdominal tenderness. There is no guarding or rebound.  Musculoskeletal:        General: No tenderness.     Cervical back: Normal range of motion and neck supple. No rigidity.  Lymphadenopathy:     Cervical: No cervical adenopathy.  Skin:    Findings: No erythema or rash.  Neurological:     Cranial Nerves: No cranial nerve deficit.     Motor: No abnormal muscle tone.     Coordination: Coordination normal.     Deep Tendon Reflexes: Reflexes normal.  Psychiatric:        Behavior: Behavior normal.        Thought Content: Thought content normal.        Judgment: Judgment normal.     Lab Results  Component Value Date   WBC 8.3 07/23/2021   HGB 14.5 07/23/2021   HCT 42.2 07/23/2021   PLT 215 07/23/2021   GLUCOSE 119 (H) 07/23/2021   CHOL 145 12/29/2018   TRIG 62.0 12/29/2018   HDL 47.70 12/29/2018   LDLCALC 85 12/29/2018   ALT 14 05/15/2020   AST 16 05/15/2020   NA 139 07/23/2021   K 3.5 07/23/2021   CL 104 07/23/2021   CREATININE 0.59 07/23/2021   BUN 10 07/23/2021   CO2 25 07/23/2021   TSH 2.64 12/29/2018   INR 1.02 09/28/2014    CT ABDOMEN  PELVIS W CONTRAST  Result Date: 09/18/2022 CLINICAL DATA:  History of ventral hernia. Soreness, tenderness on side EXAM: CT ABDOMEN AND PELVIS WITH CONTRAST TECHNIQUE: Multidetector CT imaging of the abdomen and pelvis was performed using the standard protocol following bolus administration of intravenous contrast. RADIATION DOSE REDUCTION: This exam was performed according to the departmental dose-optimization program which  includes automated exposure control, adjustment of the mA and/or kV according to patient size and/or use of iterative reconstruction technique. CONTRAST:  ISOVUE-300 IOPAMIDOL (ISOVUE-300) INJECTION 61% COMPARISON:  09/28/2014 FINDINGS: Lower chest: No acute abnormality Hepatobiliary: No focal liver abnormality is seen. Status post cholecystectomy. No biliary dilatation. Pancreas: No focal abnormality or ductal dilatation. Spleen: No focal abnormality.  Normal size. Adrenals/Urinary Tract: No adrenal abnormality. No focal renal abnormality. No stones or hydronephrosis. Urinary bladder is unremarkable. Stomach/Bowel: Stomach, large and small bowel grossly unremarkable. Vascular/Lymphatic: Scattered aortic atherosclerosis. No evidence of aneurysm or adenopathy. Reproductive: Prior hysterectomy.  No adnexal masses. Other: No free fluid or free air. Small supraumbilical ventral hernia containing fat, stable. Musculoskeletal: No acute bony abnormality. IMPRESSION: Small supraumbilical ventral hernia containing fat, stable since prior study. No new or additional abdominal hernia. No acute findings in the abdomen or pelvis. Electronically Signed   By: Charlett Nose M.D.   On: 09/18/2022 10:07    Assessment & Plan:   Problem List Items Addressed This Visit     Tobacco use disorder - Primary    Trying to quit smoking <1 ppd       ADD (attention deficit disorder)    Adderall - re-sent the Rx       COPD exacerbation (HCC)    Medrol pack, Zpack Aisupra MDI prn  01/2022 chest CT  FINDINGS:   LUNG NODULES: 5 x 3 mm solid nodule in the right middle lobe, series 3/118  is unchanged since 01/09/2016, suggesting benign etiology. 6 mm diameter  peripheral nodule in the right lower lobe is also unchanged since 2017.   LUNGS: Upper lobe predominant centrilobular nodularity with associated  bronchial wall thickening suggests respiratory bronchiolitis.   PLEURA: No pleural thickening or effusion.   HEART: Heart size normal. No pericardial effusion.   CORONARY ARTERY CALCIFICATION: Mild.   MEDIASTINUM/HILUM/AXILLA: No adenopathy.       Relevant Medications   azithromycin (ZITHROMAX Z-PAK) 250 MG tablet   methylPREDNISolone (MEDROL DOSEPAK) 4 MG TBPK tablet   Albuterol-Budesonide (AIRSUPRA) 90-80 MCG/ACT AERO      Meds ordered this encounter  Medications   amphetamine-dextroamphetamine (ADDERALL XR) 30 MG 24 hr capsule    Sig: Take 1 capsule (30 mg total) by mouth every morning.    Dispense:  30 capsule    Refill:  0    Please fill on or after 10/10/22   azithromycin (ZITHROMAX Z-PAK) 250 MG tablet    Sig: As directed    Dispense:  6 tablet    Refill:  0   methylPREDNISolone (MEDROL DOSEPAK) 4 MG TBPK tablet    Sig: As directed    Dispense:  21 tablet    Refill:  0   Albuterol-Budesonide (AIRSUPRA) 90-80 MCG/ACT AERO    Sig: Inhale 2 Inhalations into the lungs every 4 (four) hours as needed.    Dispense:  10 g    Refill:  5      Follow-up: Return for a follow-up visit.  [name], MD

## 2022-10-22 NOTE — Assessment & Plan Note (Signed)
Trying to quit smoking <1 ppd

## 2022-10-22 NOTE — Assessment & Plan Note (Signed)
Adderall - re-sent the Rx

## 2022-10-25 ENCOUNTER — Other Ambulatory Visit: Payer: Self-pay | Admitting: Internal Medicine

## 2022-11-07 ENCOUNTER — Encounter: Payer: Self-pay | Admitting: Internal Medicine

## 2022-11-07 ENCOUNTER — Telehealth (INDEPENDENT_AMBULATORY_CARE_PROVIDER_SITE_OTHER): Payer: Managed Care, Other (non HMO) | Admitting: Internal Medicine

## 2022-11-07 DIAGNOSIS — J441 Chronic obstructive pulmonary disease with (acute) exacerbation: Secondary | ICD-10-CM

## 2022-11-07 DIAGNOSIS — J209 Acute bronchitis, unspecified: Secondary | ICD-10-CM

## 2022-11-07 MED ORDER — AZITHROMYCIN 250 MG PO TABS: ORAL_TABLET | ORAL | 0 refills | Status: DC

## 2022-11-07 MED ORDER — METHYLPREDNISOLONE 4 MG PO TBPK: ORAL_TABLET | ORAL | 0 refills | Status: DC

## 2022-11-07 NOTE — Progress Notes (Signed)
Virtual Visit via Video Note  I connected with Shelly Green on 11/07/22 at  2:40 PM EDT by a video enabled telemedicine application and verified that I am speaking with the correct person using two identifiers.   I discussed the limitations of evaluation and management by telemedicine and the availability of in person appointments. The patient expressed understanding and agreed to proceed.  I was located at our [facility] office. The patient was at home. There was no one else present in the visit.  No chief complaint on file.    History of Present Illness:  C/o cough, congestion, chills x 3 d  Review of Systems  Constitutional:  Positive for chills.  HENT:  Positive for congestion, sinus pain and sore throat.   Respiratory:  Positive for cough.   Cardiovascular:  Negative for chest pain.     Observations/Objective: The patient appears to be in no acute distress  Assessment and Plan:  Problem List Items Addressed This Visit     COPD exacerbation (HCC)     New Z pack and Medrol pack      Relevant Medications   azithromycin (ZITHROMAX Z-PAK) 250 MG tablet   methylPREDNISolone (MEDROL DOSEPAK) 4 MG TBPK tablet   Acute bronchitis - Primary    New Given Z pack and Medrol pack        Meds ordered this encounter  Medications   azithromycin (ZITHROMAX Z-PAK) 250 MG tablet    Sig: As directed    Dispense:  6 tablet    Refill:  0   methylPREDNISolone (MEDROL DOSEPAK) 4 MG TBPK tablet    Sig: As directed    Dispense:  21 tablet    Refill:  0     Follow Up Instructions:    I discussed the assessment and treatment plan with the patient. The patient was provided an opportunity to ask questions and all were answered. The patient agreed with the plan and demonstrated an understanding of the instructions.   The patient was advised to call back or seek an in-person evaluation if the symptoms worsen or if the condition fails to improve as anticipated.  I provided  face-to-face time during this encounter. We were at different locations.   [name], MD

## 2022-11-07 NOTE — Assessment & Plan Note (Signed)
New Z pack and Medrol pack

## 2022-11-07 NOTE — Assessment & Plan Note (Signed)
New Given Z pack and Medrol pack

## 2022-11-27 ENCOUNTER — Encounter: Payer: Self-pay | Admitting: Internal Medicine

## 2022-11-27 ENCOUNTER — Ambulatory Visit: Payer: Managed Care, Other (non HMO) | Admitting: Internal Medicine

## 2022-11-27 VITALS — BP 118/70 | HR 72 | Temp 99.0°F | Ht 63.0 in

## 2022-11-27 DIAGNOSIS — G43109 Migraine with aura, not intractable, without status migrainosus: Secondary | ICD-10-CM

## 2022-11-27 DIAGNOSIS — J441 Chronic obstructive pulmonary disease with (acute) exacerbation: Secondary | ICD-10-CM

## 2022-11-27 MED ORDER — FLUTICASONE PROPIONATE 50 MCG/ACT NA SUSP: 2.0000 | Freq: Every day | NASAL | 6 refills | Status: DC

## 2022-11-27 MED ORDER — TRELEGY ELLIPTA 100-62.5-25 MCG/ACT IN AEPB: 1.0000 | INHALATION_SPRAY | Freq: Every day | RESPIRATORY_TRACT | 11 refills | Status: DC

## 2022-11-27 MED ORDER — AIRSUPRA 90-80 MCG/ACT IN AERO: 2.0000 | INHALATION_SPRAY | RESPIRATORY_TRACT | 5 refills | Status: DC | PRN

## 2022-11-27 MED ORDER — MONTELUKAST SODIUM 10 MG PO TABS: 10.0000 mg | ORAL_TABLET | Freq: Every day | ORAL | 3 refills | Status: DC

## 2022-11-27 NOTE — Progress Notes (Signed)
Subjective:  Patient ID: Shelly Green, female    DOB: 03-Jan-1961  Age: 62 y.o. MRN: [id number]  CC: Cough (Pt states she has a cough a/w sinus pressure and a headache.)   HPI Shelly Green presents for cough a/w sinus pressure and a headache.  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 30 capsule 0   amphetamine-dextroamphetamine (ADDERALL XR) 30 MG 24 hr capsule Take 1 capsule (30 mg total) by mouth daily. 30 capsule 0   amphetamine-dextroamphetamine (ADDERALL XR) 30 MG 24 hr capsule Take 1 capsule (30 mg total) by mouth every morning. 30 capsule 0   ARIPiprazole (ABILIFY) 2 MG tablet TAKE 1 TABLET BY MOUTH EVERY DAY 90 tablet 1   Cholecalciferol (VITAMIN D3) 50 MCG (2000 UT) capsule Take 1 capsule (2,000 Units total) by mouth daily. 100 capsule 3   desvenlafaxine (PRISTIQ) 100 MG 24 hr tablet Take 1 tablet (100 mg total) by mouth daily. 90 tablet 3   EMGALITY 120 MG/ML SOAJ Inject into the skin.     Galcanezumab-gnlm (EMGALITY) 120 MG/ML SOAJ Inject 120 mg into the skin every 28 (twenty-eight) days. 1.12 mL 11   hydrOXYzine (ATARAX) 25 MG tablet Take 1 tablet (25 mg total) by mouth every 8 (eight) hours as needed. 60 tablet 2   hyoscyamine (LEVSIN) 0.125 MG tablet TAKE 1-2 TABLETS (0.125-0.25 MG TOTAL) BY MOUTH EVERY 6 (SIX) HOURS AS NEEDED (HOT FLASHES). 720 tablet 0   ibuprofen (ADVIL) 400 MG tablet Take 1 tablet (400 mg total) by mouth every 6 (six) hours as needed. 20 tablet 0   lamoTRIgine (LAMICTAL) 25 MG tablet TAKE 1 TABLET BY MOUTH TWICE A DAY 180 tablet 1   propranolol (INDERAL) 20 MG tablet TAKE 1 TABLET BY MOUTH TWICE A DAY 180 tablet 3   SUMAtriptan (IMITREX) 100 MG tablet TAKE 1 TAB BY MOUTH DAILY AS NEEDED FOR MIGRAINE MAY REPEAT IN 2HOURS IF HEADACHE PERSISTS OR RECURS 12 tablet 5    Albuterol-Budesonide (AIRSUPRA) 90-80 MCG/ACT AERO Inhale 2 Inhalations into the lungs every 4 (four) hours as needed. 10 g 5   methylPREDNISolone (MEDROL DOSEPAK) 4 MG TBPK tablet As directed 21 tablet 0   azithromycin (ZITHROMAX Z-PAK) 250 MG tablet As directed (Patient not taking: Reported on 11/27/2022) 6 tablet 0   No facility-administered medications prior to visit.    ROS: Review of Systems  Constitutional:  Positive for chills and fatigue.  HENT:  Positive for congestion, postnasal drip, rhinorrhea and sore throat.   Respiratory:  Positive for cough.     Objective:  BP 118/70 (BP Location: Right Arm, Patient Position: Sitting, Cuff Size: Normal)   Pulse 72   Temp 99 F (37.2 C) (Oral)   Ht 5\' 3"  (1.6 m)   SpO2 97%   BMI 25.33 kg/m   BP Readings from Last 3 Encounters:  11/27/22 118/70  10/22/22 130/70  10/02/22 128/88    Wt Readings from Last 3 Encounters:  10/22/22 143 lb (64.9 kg)  10/02/22 143 lb 9.6 oz (65.1 kg)  08/14/22 142 lb (64.4 kg)    Physical Exam Constitutional:      General: She is not in acute distress.    Appearance: She is well-developed.  HENT:     Head: Normocephalic.     Right Ear: External ear normal.  Left Ear: External ear normal.     Nose: Nose normal. No rhinorrhea.     Mouth/Throat:     Pharynx: Posterior oropharyngeal erythema present.  Eyes:     General:        Right eye: No discharge.        Left eye: No discharge.     Conjunctiva/sclera: Conjunctivae normal.     Pupils: Pupils are equal, round, and reactive to light.  Neck:     Thyroid: No thyromegaly.     Vascular: No JVD.     Trachea: No tracheal deviation.  Cardiovascular:     Rate and Rhythm: Normal rate and regular rhythm.     Heart sounds: Normal heart sounds.  Pulmonary:     Effort: No respiratory distress.     Breath sounds: No stridor. No wheezing.  Abdominal:     General: Bowel sounds are normal. There is no distension.     Palpations: Abdomen is soft.  There is no mass.     Tenderness: There is no abdominal tenderness. There is no guarding or rebound.  Musculoskeletal:        General: No tenderness.     Cervical back: Normal range of motion and neck supple. No rigidity.  Lymphadenopathy:     Cervical: No cervical adenopathy.  Skin:    Findings: No erythema or rash.  Neurological:     Mental Status: She is oriented to person, place, and time. Mental status is at baseline.     Cranial Nerves: No cranial nerve deficit.     Motor: No abnormal muscle tone.     Coordination: Coordination normal.     Deep Tendon Reflexes: Reflexes normal.  Psychiatric:        Behavior: Behavior normal.        Thought Content: Thought content normal.        Judgment: Judgment normal.     Lab Results  Component Value Date   WBC 8.3 07/23/2021   HGB 14.5 07/23/2021   HCT 42.2 07/23/2021   PLT 215 07/23/2021   GLUCOSE 119 (H) 07/23/2021   CHOL 145 12/29/2018   TRIG 62.0 12/29/2018   HDL 47.70 12/29/2018   LDLCALC 85 12/29/2018   ALT 14 05/15/2020   AST 16 05/15/2020   NA 139 07/23/2021   K 3.5 07/23/2021   CL 104 07/23/2021   CREATININE 0.59 07/23/2021   BUN 10 07/23/2021   CO2 25 07/23/2021   TSH 2.64 12/29/2018   INR 1.02 09/28/2014    CT ABDOMEN PELVIS W CONTRAST  Result Date: 09/18/2022 CLINICAL DATA:  History of ventral hernia. Soreness, tenderness on side EXAM: CT ABDOMEN AND PELVIS WITH CONTRAST TECHNIQUE: Multidetector CT imaging of the abdomen and pelvis was performed using the standard protocol following bolus administration of intravenous contrast. RADIATION DOSE REDUCTION: This exam was performed according to the departmental dose-optimization program which includes automated exposure control, adjustment of the mA and/or kV according to patient size and/or use of iterative reconstruction technique. CONTRAST:  ISOVUE-300 IOPAMIDOL (ISOVUE-300) INJECTION 61% COMPARISON:  09/28/2014 FINDINGS: Lower chest: No acute abnormality  Hepatobiliary: No focal liver abnormality is seen. Status post cholecystectomy. No biliary dilatation. Pancreas: No focal abnormality or ductal dilatation. Spleen: No focal abnormality.  Normal size. Adrenals/Urinary Tract: No adrenal abnormality. No focal renal abnormality. No stones or hydronephrosis. Urinary bladder is unremarkable. Stomach/Bowel: Stomach, large and small bowel grossly unremarkable. Vascular/Lymphatic: Scattered aortic atherosclerosis. No evidence of aneurysm or adenopathy. Reproductive: Prior hysterectomy.  No adnexal masses. Other: No free fluid or free air. Small supraumbilical ventral hernia containing fat, stable. Musculoskeletal: No acute bony abnormality. IMPRESSION: Small supraumbilical ventral hernia containing fat, stable since prior study. No new or additional abdominal hernia. No acute findings in the abdomen or pelvis. Electronically Signed   By: Charlett Nose M.D.   On: 09/18/2022 10:07    Assessment & Plan:   Problem List Items Addressed This Visit     Migraine headache with aura    Stable.  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    Dispense:  10 g    Refill:  5   montelukast (SINGULAIR) 10 MG tablet    Sig: Take 1 tablet (10 mg total) by mouth daily.    Dispense:  90 tablet    Refill:  3   fluticasone (FLONASE) 50 MCG/ACT nasal spray    Sig: Place 2 sprays into both nostrils daily.    Dispense:  16 g    Refill:  6    Fluticasone-Umeclidin-Vilant (TRELEGY ELLIPTA) 100-62.5-25 MCG/ACT AEPB    Sig: Inhale 1 puff into the lungs daily.    Dispense:  1 each    Refill:  11      Follow-up: No follow-ups on file.  [name], MD

## 2022-11-27 NOTE — Assessment & Plan Note (Addendum)
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

## 2022-11-29 ENCOUNTER — Other Ambulatory Visit: Payer: Self-pay | Admitting: Internal Medicine

## 2022-12-22 ENCOUNTER — Encounter: Payer: Self-pay | Admitting: Internal Medicine

## 2022-12-22 NOTE — Assessment & Plan Note (Signed)
Stable.  No change On Emgality

## 2022-12-24 NOTE — Progress Notes (Unsigned)
NEUROLOGY FOLLOW UP OFFICE NOTE  Shelly Green [id number]  Assessment/Plan:   Migraine without aura, with status migrainosus, not intractable   Migraine prevention:  Emgality.  *** Migraine rescue:  sumatriptan tablet *** Limit use of pain relievers to no more than 2 days out of week to prevent risk of rebound or medication-overuse headache. Keep headache diary Follow up 6 months     Subjective:  Shelly Green is a 62 year old right-handed female with migraines, ADD, anxiety, arthritis, IBS, migraines and history of breast cancer in remission who follows up for migraine.   UPDATE: Started Emgality. *** Intensity:  moderate Duration:  1 hour with sumatriptan Frequency:  5 days Rescue protocol:  sumatriptan Current NSAIDS/analgesics:  Tylenol, ASA Current triptans:  sumatriptan 100mg  Current ergotamine:  none Current anti-emetic:  none Current muscle relaxants:  none Current Antihypertensive medications:  propranolol 20mg  twice daily Current Antidepressant medications: desvenlafaxine Current Anticonvulsant medications:  lamotrigine Current anti-CGRP:  Emgality Current Vitamins/Herbal/Supplements:  MVI, D Current Antihistamines/Decongestants:  none Other therapy:  OMM Hormone/birth control:  Femara Other medications:  Adderall XR, hydroxyzine   Caffeine:  No coffee.  Maybe 1 small coca cola daily Diet:  Drinks little water. Exercise:  yes Depression:  yes; Anxiety:  none Other pain:  Chronic low back pain Sleep:  Poor.  Hot flashes at night.    HISTORY:  Started having headaches as a child.  They were moderate to severe bifrontal pressupe to throbbing.  They are associated with nausea, photophobia and phonophobia.  If takes something early and lay down, they will last within an hour, otherwise all day.  They occur at least 2 to 3 days a week.  Triggers include change in weather and seasonal allergies.  Relieving factors include rest.    Increased after  starting Wellbutrin.  Severe pain in jaw and back of head.  Neck pain.  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  Associated with nausea, vomiting, photophobia and phonophobia.  They were daily.  Stopped Wellbutrin after 5 days.  Since stopping Wellbutrin, headaches decreased to once a week and now back to baseline.       Past NSAIDS/analgesics:  Diclofenac, tramadol, ibuprofen, Fioricet Past abortive triptans:  rizatriptan Past abortive ergotamine:  none Past muscle relaxants:  none Past anti-emetic:  Zofran, Compazine Past antihypertensive medications:  none Past antidepressant medications:  Cymbalta, Wellbutrin (aggravated headaches), paroxetine, fluoxetine, Trintellix Past anticonvulsant medications:  topiramate, gabapentin Past anti-CGRP:  none.  Would avoid Aimovig due to history of uncontrolled hypertension  Past vitamins/Herbal/Supplements:  none Past antihistamines/decongestants:  Benadryl, Flonase, Claritin Other past therapies:  none     Family history of headache:  Maternal grandmother, mother, sisters  PAST MEDICAL HISTORY: Past Medical History:  Diagnosis Date   Arthritis    Cancer (HCC)    breast    History of colon polyps    HTN (hypertension) 07/31/2021   IBS (irritable bowel syndrome)    Menopausal disorder    Migraines    Seasonal allergies    Social anxiety disorder     MEDICATIONS: Current Outpatient Medications on File Prior to Visit  Medication Sig Dispense Refill   Albuterol-Budesonide (AIRSUPRA) 90-80 MCG/ACT AERO Inhale 2 Inhalations into the lungs every 4 (four) hours as needed. 10 g 5   amphetamine-dextroamphetamine (ADDERALL XR) 30 MG 24 hr capsule Take 1 capsule (30 mg total) by mouth every morning. 30 capsule 0  amphetamine-dextroamphetamine (ADDERALL XR) 30 MG 24 hr capsule Take 1 capsule (30 mg total) by mouth daily. 30 capsule 0   amphetamine-dextroamphetamine  (ADDERALL XR) 30 MG 24 hr capsule Take 1 capsule (30 mg total) by mouth every morning. 30 capsule 0   ARIPiprazole (ABILIFY) 2 MG tablet TAKE 1 TABLET BY MOUTH EVERY DAY 90 tablet 1   azithromycin (ZITHROMAX Z-PAK) 250 MG tablet As directed (Patient not taking: Reported on 11/27/2022) 6 tablet 0   Cholecalciferol (VITAMIN D3) 50 MCG (2000 UT) capsule Take 1 capsule (2,000 Units total) by mouth daily. 100 capsule 3   desvenlafaxine (PRISTIQ) 100 MG 24 hr tablet Take 1 tablet (100 mg total) by mouth daily. 90 tablet 3   EMGALITY 120 MG/ML SOAJ Inject into the skin.     fluticasone (FLONASE) 50 MCG/ACT nasal spray Place 2 sprays into both nostrils daily. 16 g 6   Fluticasone-Umeclidin-Vilant (TRELEGY ELLIPTA) 100-62.5-25 MCG/ACT AEPB Inhale 1 puff into the lungs daily. 1 each 11   Galcanezumab-gnlm (EMGALITY) 120 MG/ML SOAJ Inject 120 mg into the skin every 28 (twenty-eight) days. 1.12 mL 11   hydrOXYzine (ATARAX) 25 MG tablet Take 1 tablet (25 mg total) by mouth every 8 (eight) hours as needed. 60 tablet 2   hyoscyamine (LEVSIN) 0.125 MG tablet TAKE 1-2 TABLETS (0.125-0.25 MG TOTAL) BY MOUTH EVERY 6 (SIX) HOURS AS NEEDED (HOT FLASHES). 720 tablet 0   ibuprofen (ADVIL) 400 MG tablet Take 1 tablet (400 mg total) by mouth every 6 (six) hours as needed. 20 tablet 0   lamoTRIgine (LAMICTAL) 25 MG tablet TAKE 1 TABLET BY MOUTH TWICE A DAY 180 tablet 1   montelukast (SINGULAIR) 10 MG tablet Take 1 tablet (10 mg total) by mouth daily. 90 tablet 3   propranolol (INDERAL) 20 MG tablet TAKE 1 TABLET BY MOUTH TWICE A DAY 180 tablet 3   SUMAtriptan (IMITREX) 100 MG tablet TAKE 1 TAB BY MOUTH DAILY AS NEEDED FOR MIGRAINE MAY REPEAT IN 2HOURS IF HEADACHE PERSISTS OR RECURS 12 tablet 5   No current facility-administered medications on file prior to visit.     ALLERGIES: Allergies  Allergen Reactions   Codeine Anaphylaxis   Hydrocodone Shortness Of Breath   Penicillins Anaphylaxis    As a child Has patient  had a PCN reaction causing immediate rash, facial/tongue/throat swelling, SOB or lightheadedness with hypotension: yes Has patient had a PCN reaction causing severe rash involving mucus membranes or skin necrosis: unknown Has patient had a PCN reaction that required hospitalization: yes Has patient had a PCN reaction occurring within the last 10 years: no If all of the above answers are "NO", then may proceed with Cephalosporin use.    Pholcodine Other (See Comments)   Sulfa Antibiotics Anaphylaxis and Rash   Tramadol Shortness Of Breath   Clindamycin/Lincomycin     rush   Effexor [Venlafaxine]     Sleepy, heavy arms   Fluoxetine     Feeling worse   Lamictal [Lamotrigine]     depression   Olanzapine     Too sleepy   Wellbutrin [Bupropion]     HAs, anger    FAMILY HISTORY: Family History  Problem Relation Age of Onset   Arthritis Mother    Lymphoma Mother    Colon cancer Sister 43   Mental illness Daughter    Mental illness Maternal Aunt    Arthritis Maternal Grandmother    Hypertension Maternal Grandfather       Objective:  *** General: No  acute distress.  Patient appears well-groomed.   Head:  Normocephalic/atraumatic Neck:  Supple.  No paraspinal tenderness.  Full range of motion. Heart:  Regular rate and rhythm. Neuro:  Alert and oriented.  Speech fluent and not dysarthric.  Language intact.  CN II-XII intact.  Bulk and tone normal.  Muscle strength 5/5 throughout.  Deep tendon reflexes 2+ throughout.  Gait normal.  Romberg negative.   Shon Millet, DO  CC: Jacinta Shoe, MD

## 2022-12-25 ENCOUNTER — Ambulatory Visit: Payer: Managed Care, Other (non HMO) | Admitting: Neurology

## 2022-12-25 ENCOUNTER — Encounter: Payer: Self-pay | Admitting: Neurology

## 2022-12-25 VITALS — BP 152/99 | HR 79 | Ht 63.0 in | Wt 144.0 lb

## 2022-12-25 DIAGNOSIS — I10 Essential (primary) hypertension: Secondary | ICD-10-CM

## 2022-12-25 DIAGNOSIS — G43009 Migraine without aura, not intractable, without status migrainosus: Secondary | ICD-10-CM

## 2022-12-25 NOTE — Patient Instructions (Signed)
Continue Emgality, sumatriptan

## 2023-01-23 ENCOUNTER — Telehealth: Payer: Self-pay

## 2023-01-23 NOTE — Telephone Encounter (Signed)
 PA needed for Emgality 120 mg

## 2023-01-24 ENCOUNTER — Telehealth: Payer: Self-pay

## 2023-01-24 NOTE — Telephone Encounter (Signed)
*  [facility]  Pharmacy Patient Advocate Encounter   Received notification from Pt Calls Messages that prior authorization for Emgality  120MG /ML auto-injectors (migraine)  is required/requested.   Insurance verification completed.   The patient is insured through [employer] .   Per test claim: PA required; PA submitted to above mentioned insurance via CoverMyMeds Key/confirmation #/EOC [id number] Status is pending

## 2023-01-24 NOTE — Telephone Encounter (Signed)
 PA request has been Submitted. New Encounter created for follow up. For additional info see Pharmacy Prior Auth telephone encounter from 01/03.

## 2023-02-17 ENCOUNTER — Encounter: Payer: Self-pay | Admitting: Internal Medicine

## 2023-02-17 ENCOUNTER — Ambulatory Visit: Payer: Managed Care, Other (non HMO) | Admitting: Internal Medicine

## 2023-02-17 VITALS — BP 110/70 | HR 76 | Temp 98.6°F | Ht 63.0 in | Wt 137.0 lb

## 2023-02-17 DIAGNOSIS — R232 Flushing: Secondary | ICD-10-CM | POA: Diagnosis not present

## 2023-02-17 DIAGNOSIS — F339 Major depressive disorder, recurrent, unspecified: Secondary | ICD-10-CM

## 2023-02-17 DIAGNOSIS — F909 Attention-deficit hyperactivity disorder, unspecified type: Secondary | ICD-10-CM

## 2023-02-17 DIAGNOSIS — T451X5A Adverse effect of antineoplastic and immunosuppressive drugs, initial encounter: Secondary | ICD-10-CM

## 2023-02-17 DIAGNOSIS — N951 Menopausal and female climacteric states: Secondary | ICD-10-CM | POA: Diagnosis not present

## 2023-02-17 DIAGNOSIS — R5383 Other fatigue: Secondary | ICD-10-CM | POA: Insufficient documentation

## 2023-02-17 DIAGNOSIS — R052 Subacute cough: Secondary | ICD-10-CM

## 2023-02-17 MED ORDER — AIRSUPRA 90-80 MCG/ACT IN AERO: 2.0000 | INHALATION_SPRAY | RESPIRATORY_TRACT | 11 refills | Status: AC | PRN

## 2023-02-17 MED ORDER — VEOZAH 45 MG PO TABS: 45.0000 mg | ORAL_TABLET | Freq: Every day | ORAL | 5 refills | Status: DC

## 2023-02-17 MED ORDER — AMPHETAMINE-DEXTROAMPHET ER 30 MG PO CP24: 30.0000 mg | ORAL_CAPSULE | ORAL | 0 refills | Status: DC

## 2023-02-17 MED ORDER — AMPHETAMINE-DEXTROAMPHET ER 30 MG PO CP24: 30.0000 mg | ORAL_CAPSULE | Freq: Every day | ORAL | 0 refills | Status: DC

## 2023-02-17 MED ORDER — AZITHROMYCIN 250 MG PO TABS: ORAL_TABLET | ORAL | 0 refills | Status: DC

## 2023-02-17 MED ORDER — CARIPRAZINE HCL 1.5 MG PO CAPS: 1.5000 mg | ORAL_CAPSULE | Freq: Every day | ORAL | 5 refills | Status: DC

## 2023-02-17 NOTE — Progress Notes (Signed)
Subjective:  Patient ID: Shelly Green, female    DOB: 04/29/1960  Age: 63 y.o. MRN: [id number]  CC: Medical Management of Chronic Issues (6 mnth f/u)   HPI JAMAIA BRUM presents for ADD, depression C/o hot flashes, fatigue  Outpatient Medications Prior to Visit  Medication Sig Dispense Refill   Cholecalciferol (VITAMIN D3) 50 MCG (2000 UT) capsule Take 1 capsule (2,000 Units total) by mouth daily. 100 capsule 3   desvenlafaxine (PRISTIQ) 100 MG 24 hr tablet Take 1 tablet (100 mg total) by mouth daily. 90 tablet 3   EMGALITY 120 MG/ML SOAJ Inject into the skin.     fluticasone (FLONASE) 50 MCG/ACT nasal spray Place 2 sprays into both nostrils daily. 16 g 6   Fluticasone-Umeclidin-Vilant (TRELEGY ELLIPTA) 100-62.5-25 MCG/ACT AEPB Inhale 1 puff into the lungs daily. 1 each 11   Galcanezumab-gnlm (EMGALITY) 120 MG/ML SOAJ Inject 120 mg into the skin every 28 (twenty-eight) days. 1.12 mL 11   hyoscyamine (LEVSIN) 0.125 MG tablet TAKE 1-2 TABLETS (0.125-0.25 MG TOTAL) BY MOUTH EVERY 6 (SIX) HOURS AS NEEDED (HOT FLASHES). 720 tablet 0   ibuprofen (ADVIL) 400 MG tablet Take 1 tablet (400 mg total) by mouth every 6 (six) hours as needed. 20 tablet 0   propranolol (INDERAL) 20 MG tablet TAKE 1 TABLET BY MOUTH TWICE A DAY 180 tablet 3   SUMAtriptan (IMITREX) 100 MG tablet TAKE 1 TAB BY MOUTH DAILY AS NEEDED FOR MIGRAINE MAY REPEAT IN 2HOURS IF HEADACHE PERSISTS OR RECURS 12 tablet 5   Albuterol-Budesonide (AIRSUPRA) 90-80 MCG/ACT AERO Inhale 2 Inhalations into the lungs every 4 (four) hours as needed. 10 g 5   amphetamine-dextroamphetamine (ADDERALL XR) 30 MG 24 hr capsule Take 1 capsule (30 mg total) by mouth every morning. 30 capsule 0   amphetamine-dextroamphetamine (ADDERALL XR) 30 MG 24 hr capsule Take 1 capsule (30 mg total) by mouth daily. 30 capsule 0   amphetamine-dextroamphetamine (ADDERALL XR) 30 MG 24 hr capsule Take 1 capsule (30 mg total) by mouth every morning. 30 capsule 0    hydrOXYzine (ATARAX) 25 MG tablet Take 1 tablet (25 mg total) by mouth every 8 (eight) hours as needed. (Patient not taking: Reported on 02/17/2023) 60 tablet 2   lamoTRIgine (LAMICTAL) 25 MG tablet TAKE 1 TABLET BY MOUTH TWICE A DAY (Patient not taking: Reported on 02/17/2023) 180 tablet 1   No facility-administered medications prior to visit.    ROS: Review of Systems  Constitutional:  Positive for diaphoresis and fatigue. Negative for activity change, appetite change, chills and unexpected weight change.  HENT:  Positive for congestion. Negative for mouth sores and sinus pressure.   Eyes:  Negative for visual disturbance.  Respiratory:  Positive for cough. Negative for chest tightness.   Gastrointestinal:  Negative for abdominal pain and nausea.  Genitourinary:  Negative for difficulty urinating, frequency and vaginal pain.  Musculoskeletal:  Negative for back pain and gait problem.  Skin:  Negative for pallor and rash.  Neurological:  Negative for dizziness, tremors, weakness, numbness and headaches.  Hematological:  Bruises/bleeds easily.  Psychiatric/Behavioral:  Positive for dysphoric mood. Negative for confusion, sleep disturbance and suicidal ideas. The patient is nervous/anxious.     Objective:  BP 110/70 (BP Location: Left Arm, Patient Position: Sitting, Cuff Size: Normal)   Pulse 76   Temp 98.6 F (37 C) (Oral)   Ht 5\' 3"  (1.6 m)   Wt 137 lb (62.1 kg)   SpO2 95%   BMI 24.27 kg/m  BP Readings from Last 3 Encounters:  02/17/23 110/70  12/25/22 (!) 152/99  11/27/22 118/70    Wt Readings from Last 3 Encounters:  02/17/23 137 lb (62.1 kg)  12/25/22 144 lb (65.3 kg)  10/22/22 143 lb (64.9 kg)    Physical Exam Constitutional:      General: She is not in acute distress.    Appearance: She is well-developed.  HENT:     Head: Normocephalic.     Right Ear: External ear normal.     Left Ear: External ear normal.     Nose: Nose normal.  Eyes:     General:         Right eye: No discharge.        Left eye: No discharge.     Conjunctiva/sclera: Conjunctivae normal.     Pupils: Pupils are equal, round, and reactive to light.  Neck:     Thyroid: No thyromegaly.     Vascular: No JVD.     Trachea: No tracheal deviation.  Cardiovascular:     Rate and Rhythm: Normal rate and regular rhythm.     Heart sounds: Normal heart sounds.  Pulmonary:     Effort: No respiratory distress.     Breath sounds: No stridor. No wheezing.  Abdominal:     General: Bowel sounds are normal. There is no distension.     Palpations: Abdomen is soft. There is no mass.     Tenderness: There is no abdominal tenderness. There is no guarding or rebound.  Musculoskeletal:        General: No tenderness.     Cervical back: Normal range of motion and neck supple. No rigidity.  Lymphadenopathy:     Cervical: No cervical adenopathy.  Skin:    Findings: No erythema or rash.  Neurological:     Cranial Nerves: No cranial nerve deficit.     Motor: No abnormal muscle tone.     Coordination: Coordination normal.     Deep Tendon Reflexes: Reflexes normal.  Psychiatric:        Behavior: Behavior normal.        Thought Content: Thought content normal.        Judgment: Judgment normal.     Lab Results  Component Value Date   WBC 8.3 07/23/2021   HGB 14.5 07/23/2021   HCT 42.2 07/23/2021   PLT 215 07/23/2021   GLUCOSE 119 (H) 07/23/2021   CHOL 145 12/29/2018   TRIG 62.0 12/29/2018   HDL 47.70 12/29/2018   LDLCALC 85 12/29/2018   ALT 14 05/15/2020   AST 16 05/15/2020   NA 139 07/23/2021   K 3.5 07/23/2021   CL 104 07/23/2021   CREATININE 0.59 07/23/2021   BUN 10 07/23/2021   CO2 25 07/23/2021   TSH 2.64 12/29/2018   INR 1.02 09/28/2014    CT ABDOMEN PELVIS W CONTRAST Result Date: 09/18/2022 CLINICAL DATA:  History of ventral hernia. Soreness, tenderness on side EXAM: CT ABDOMEN AND PELVIS WITH CONTRAST TECHNIQUE: Multidetector CT imaging of the abdomen and pelvis was  performed using the standard protocol following bolus administration of intravenous contrast. RADIATION DOSE REDUCTION: This exam was performed according to the departmental dose-optimization program which includes automated exposure control, adjustment of the mA and/or kV according to patient size and/or use of iterative reconstruction technique. CONTRAST:  ISOVUE-300 IOPAMIDOL (ISOVUE-300) INJECTION 61% COMPARISON:  09/28/2014 FINDINGS: Lower chest: No acute abnormality Hepatobiliary: No focal liver abnormality is seen. Status post cholecystectomy. No biliary dilatation.  Pancreas: No focal abnormality or ductal dilatation. Spleen: No focal abnormality.  Normal size. Adrenals/Urinary Tract: No adrenal abnormality. No focal renal abnormality. No stones or hydronephrosis. Urinary bladder is unremarkable. Stomach/Bowel: Stomach, large and small bowel grossly unremarkable. Vascular/Lymphatic: Scattered aortic atherosclerosis. No evidence of aneurysm or adenopathy. Reproductive: Prior hysterectomy.  No adnexal masses. Other: No free fluid or free air. Small supraumbilical ventral hernia containing fat, stable. Musculoskeletal: No acute bony abnormality. IMPRESSION: Small supraumbilical ventral hernia containing fat, stable since prior study. No new or additional abdominal hernia. No acute findings in the abdomen or pelvis. Electronically Signed   By: Charlett Nose M.D.   On: 09/18/2022 10:07    Assessment & Plan:   Problem List Items Addressed This Visit     Menopause syndrome - Primary   Try Veozah -      Cough   URI - [facility] Chest CT is pending - [facility]      ADD (attention deficit disorder)   Adderall Rx  Potential benefits of a long term amphetamines  use as well as potential risks  and complications were explained to the patient and were aknowledged.      Relevant Orders   TSH   Urinalysis   CBC with Differential/Platelet   Lipid panel   PSA   Comprehensive metabolic panel   Vitamin  B12   T4, free   Sedimentation rate   Depression   Will Rx Vraylar      Relevant Orders   TSH   Urinalysis   CBC with Differential/Platelet   Lipid panel   PSA   Comprehensive metabolic panel   Vitamin B12   T4, free   Sedimentation rate   Hot flashes related to aromatase inhibitor therapy   Try Veozah       Relevant Orders   TSH   Urinalysis   CBC with Differential/Platelet   Lipid panel   PSA   Comprehensive metabolic panel   Vitamin B12   T4, free   Sedimentation rate   Fatigue   Post-URI Check labs Chest CT pending at [facility]         Meds ordered this encounter  Medications   Albuterol-Budesonide (AIRSUPRA) 90-80 MCG/ACT AERO    Sig: Inhale 2 Inhalations into the lungs every 4 (four) hours as needed.    Dispense:  10 g    Refill:  11   amphetamine-dextroamphetamine (ADDERALL XR) 30 MG 24 hr capsule    Sig: Take 1 capsule (30 mg total) by mouth every morning.    Dispense:  30 capsule    Refill:  0    Please fill on or after 04/18/23   Fezolinetant (VEOZAH) 45 MG TABS    Sig: Take 1 tablet (45 mg total) by mouth daily.    Dispense:  30 tablet    Refill:  5   amphetamine-dextroamphetamine (ADDERALL XR) 30 MG 24 hr capsule    Sig: Take 1 capsule (30 mg total) by mouth daily.    Dispense:  30 capsule    Refill:  0    Please fill on or after 02/17/23   amphetamine-dextroamphetamine (ADDERALL XR) 30 MG 24 hr capsule    Sig: Take 1 capsule (30 mg total) by mouth every morning.    Dispense:  30 capsule    Refill:  0    Please fill on or after 03/19/23   cariprazine (VRAYLAR) 1.5 MG capsule    Sig: Take 1 capsule (1.5 mg total) by mouth daily.  Dispense:  30 capsule    Refill:  5    Failed Olanzapine, Abilify   azithromycin (ZITHROMAX Z-PAK) 250 MG tablet    Sig: As directed    Dispense:  6 tablet    Refill:  0      Follow-up: Return for a follow-up visit.  [name], MD

## 2023-02-17 NOTE — Assessment & Plan Note (Signed)
Try Shelly Green -

## 2023-02-17 NOTE — Assessment & Plan Note (Signed)
URI - [facility] Chest CT is pending - [facility]

## 2023-02-17 NOTE — Assessment & Plan Note (Signed)
Try Allyne Gee -

## 2023-02-17 NOTE — Assessment & Plan Note (Signed)
Will Rx Vraylar

## 2023-02-17 NOTE — Assessment & Plan Note (Signed)
Post-URI Check labs Chest CT pending at [facility]

## 2023-02-17 NOTE — Assessment & Plan Note (Signed)
Adderall Rx  Potential benefits of a long term amphetamines  use as well as potential risks  and complications were explained to the patient and were aknowledged.

## 2023-02-19 ENCOUNTER — Encounter: Payer: Self-pay | Admitting: Internal Medicine

## 2023-02-19 ENCOUNTER — Other Ambulatory Visit (HOSPITAL_COMMUNITY): Payer: Self-pay

## 2023-02-19 NOTE — Telephone Encounter (Signed)
Pharmacy Patient Advocate Encounter  Received notification from [employer] that Prior Authorization for Emgality 120MG /ML auto-injectors (migraine)  has been APPROVED from 01/24/2023 to 01/27/2024. Ran test claim, Copay is $35.00. This test claim was processed through [facility]- copay amounts may vary at other pharmacies due to pharmacy/plan contracts, or as the patient moves through the different stages of their insurance plan.

## 2023-02-20 ENCOUNTER — Other Ambulatory Visit: Payer: Managed Care, Other (non HMO)

## 2023-02-20 DIAGNOSIS — R232 Flushing: Secondary | ICD-10-CM | POA: Diagnosis not present

## 2023-02-20 DIAGNOSIS — F909 Attention-deficit hyperactivity disorder, unspecified type: Secondary | ICD-10-CM

## 2023-02-20 DIAGNOSIS — F339 Major depressive disorder, recurrent, unspecified: Secondary | ICD-10-CM

## 2023-02-20 DIAGNOSIS — T451X5A Adverse effect of antineoplastic and immunosuppressive drugs, initial encounter: Secondary | ICD-10-CM

## 2023-02-20 LAB — COMPREHENSIVE METABOLIC PANEL
ALT: 9 U/L (ref 0–35)
AST: 14 U/L (ref 0–37)
Albumin: 4.4 g/dL (ref 3.5–5.2)
Alkaline Phosphatase: 83 U/L (ref 39–117)
BUN: 17 mg/dL (ref 6–23)
CO2: 28 meq/L (ref 19–32)
Calcium: 9.5 mg/dL (ref 8.4–10.5)
Chloride: 106 meq/L (ref 96–112)
Creatinine, Ser: 0.72 mg/dL (ref 0.40–1.20)
GFR: 89.65 mL/min (ref >60.00)
Glucose, Bld: 101 mg/dL — ABNORMAL HIGH (ref 70–99)
Potassium: 3.6 meq/L (ref 3.5–5.1)
Sodium: 143 meq/L (ref 135–145)
Total Bilirubin: 0.5 mg/dL (ref 0.2–1.2)
Total Protein: 6.4 g/dL (ref 6.0–8.3)

## 2023-02-20 LAB — CBC WITH DIFFERENTIAL/PLATELET
Basophils Absolute: 0.1 10*3/uL (ref 0.0–0.1)
Basophils Relative: 1.4 % (ref 0.0–3.0)
Eosinophils Absolute: 0.1 10*3/uL (ref 0.0–0.7)
Eosinophils Relative: 1.4 % (ref 0.0–5.0)
HCT: 48.1 % — ABNORMAL HIGH (ref 36.0–46.0)
Hemoglobin: 16.2 g/dL — ABNORMAL HIGH (ref 12.0–15.0)
Lymphocytes Relative: 30.5 % (ref 12.0–46.0)
Lymphs Abs: 2.6 10*3/uL (ref 0.7–4.0)
MCHC: 33.6 g/dL (ref 30.0–36.0)
MCV: 98.2 fL (ref 78.0–100.0)
Monocytes Absolute: 0.7 10*3/uL (ref 0.1–1.0)
Monocytes Relative: 7.8 % (ref 3.0–12.0)
Neutro Abs: 5 10*3/uL (ref 1.4–7.7)
Neutrophils Relative %: 58.9 % (ref 43.0–77.0)
Platelets: 183 10*3/uL (ref 150.0–400.0)
RBC: 4.9 Mil/uL (ref 3.87–5.11)
RDW: 13 % (ref 11.5–15.5)
WBC: 8.5 10*3/uL (ref 4.0–10.5)

## 2023-02-20 LAB — TSH: TSH: 1.24 u[IU]/mL (ref 0.35–5.50)

## 2023-02-20 LAB — LIPID PANEL
Cholesterol: 203 mg/dL — ABNORMAL HIGH (ref 0–200)
HDL: 38.2 mg/dL — ABNORMAL LOW (ref >39.00)
LDL Cholesterol: 112 mg/dL — ABNORMAL HIGH (ref 0–99)
NonHDL: 164.91
Total CHOL/HDL Ratio: 5
Triglycerides: 263 mg/dL — ABNORMAL HIGH (ref 0.0–149.0)
VLDL: 52.6 mg/dL — ABNORMAL HIGH (ref 0.0–40.0)

## 2023-02-20 LAB — T4, FREE: Free T4: 0.79 ng/dL (ref 0.60–1.60)

## 2023-02-20 LAB — VITAMIN B12: Vitamin B-12: >1537 pg/mL — ABNORMAL HIGH (ref 211–911)

## 2023-02-20 LAB — PSA: PSA: 0 ng/mL — ABNORMAL LOW (ref 0.10–4.00)

## 2023-02-20 LAB — SEDIMENTATION RATE: Sed Rate: 11 mm/h (ref 0–30)

## 2023-02-21 ENCOUNTER — Other Ambulatory Visit (INDEPENDENT_AMBULATORY_CARE_PROVIDER_SITE_OTHER): Payer: Managed Care, Other (non HMO)

## 2023-02-21 DIAGNOSIS — T451X5A Adverse effect of antineoplastic and immunosuppressive drugs, initial encounter: Secondary | ICD-10-CM | POA: Diagnosis not present

## 2023-02-21 DIAGNOSIS — F909 Attention-deficit hyperactivity disorder, unspecified type: Secondary | ICD-10-CM

## 2023-02-21 DIAGNOSIS — R232 Flushing: Secondary | ICD-10-CM

## 2023-02-21 DIAGNOSIS — F339 Major depressive disorder, recurrent, unspecified: Secondary | ICD-10-CM

## 2023-02-21 LAB — URINALYSIS
Bilirubin Urine: NEGATIVE
Ketones, ur: NEGATIVE
Leukocytes,Ua: NEGATIVE
Nitrite: NEGATIVE
Specific Gravity, Urine: >=1.03 — AB (ref 1.000–1.030)
Total Protein, Urine: NEGATIVE
Urine Glucose: NEGATIVE
Urobilinogen, UA: 0.2 (ref 0.0–1.0)
pH: 6 (ref 5.0–8.0)

## 2023-02-23 ENCOUNTER — Encounter: Payer: Self-pay | Admitting: Internal Medicine

## 2023-04-29 ENCOUNTER — Other Ambulatory Visit: Payer: Self-pay | Admitting: Internal Medicine

## 2023-05-19 ENCOUNTER — Other Ambulatory Visit: Payer: Self-pay | Admitting: Internal Medicine

## 2023-05-19 ENCOUNTER — Ambulatory Visit: Payer: Managed Care, Other (non HMO) | Admitting: Internal Medicine

## 2023-05-19 ENCOUNTER — Encounter: Payer: Self-pay | Admitting: Internal Medicine

## 2023-05-19 VITALS — BP 190/118 | HR 57 | Temp 98.1°F | Ht 63.0 in | Wt 129.6 lb

## 2023-05-19 DIAGNOSIS — K580 Irritable bowel syndrome with diarrhea: Secondary | ICD-10-CM

## 2023-05-19 DIAGNOSIS — F909 Attention-deficit hyperactivity disorder, unspecified type: Secondary | ICD-10-CM

## 2023-05-19 DIAGNOSIS — T451X5A Adverse effect of antineoplastic and immunosuppressive drugs, initial encounter: Secondary | ICD-10-CM

## 2023-05-19 DIAGNOSIS — R197 Diarrhea, unspecified: Secondary | ICD-10-CM | POA: Diagnosis not present

## 2023-05-19 DIAGNOSIS — R1084 Generalized abdominal pain: Secondary | ICD-10-CM | POA: Diagnosis not present

## 2023-05-19 DIAGNOSIS — R232 Flushing: Secondary | ICD-10-CM

## 2023-05-19 LAB — CBC WITH DIFFERENTIAL/PLATELET
Basophils Absolute: 0 10*3/uL (ref 0.0–0.1)
Basophils Relative: 0.5 % (ref 0.0–3.0)
Eosinophils Absolute: 0.1 10*3/uL (ref 0.0–0.7)
Eosinophils Relative: 0.6 % (ref 0.0–5.0)
HCT: 47.9 % — ABNORMAL HIGH (ref 36.0–46.0)
Hemoglobin: 16.2 g/dL — ABNORMAL HIGH (ref 12.0–15.0)
Lymphocytes Relative: 22.4 % (ref 12.0–46.0)
Lymphs Abs: 2.3 10*3/uL (ref 0.7–4.0)
MCHC: 33.8 g/dL (ref 30.0–36.0)
MCV: 96.8 fl (ref 78.0–100.0)
Monocytes Absolute: 0.8 10*3/uL (ref 0.1–1.0)
Monocytes Relative: 7.4 % (ref 3.0–12.0)
Neutro Abs: 7.1 10*3/uL (ref 1.4–7.7)
Neutrophils Relative %: 69.1 % (ref 43.0–77.0)
Platelets: 204 10*3/uL (ref 150.0–400.0)
RBC: 4.94 Mil/uL (ref 3.87–5.11)
RDW: 13.4 % (ref 11.5–15.5)
WBC: 10.3 10*3/uL (ref 4.0–10.5)

## 2023-05-19 LAB — URINALYSIS, ROUTINE W REFLEX MICROSCOPIC
Leukocytes,Ua: NEGATIVE
Nitrite: NEGATIVE
Specific Gravity, Urine: >=1.03 — AB (ref 1.000–1.030)
Total Protein, Urine: 30 — AB
Urine Glucose: NEGATIVE
Urobilinogen, UA: 0.2 (ref 0.0–1.0)
pH: 6 (ref 5.0–8.0)

## 2023-05-19 LAB — COMPREHENSIVE METABOLIC PANEL WITH GFR
ALT: 13 U/L (ref 0–35)
AST: 17 U/L (ref 0–37)
Albumin: 4.7 g/dL (ref 3.5–5.2)
Alkaline Phosphatase: 80 U/L (ref 39–117)
BUN: 11 mg/dL (ref 6–23)
CO2: 29 meq/L (ref 19–32)
Calcium: 9.6 mg/dL (ref 8.4–10.5)
Chloride: 102 meq/L (ref 96–112)
Creatinine, Ser: 0.72 mg/dL (ref 0.40–1.20)
GFR: 89.5 mL/min (ref >60.00)
Glucose, Bld: 130 mg/dL — ABNORMAL HIGH (ref 70–99)
Potassium: 3.6 meq/L (ref 3.5–5.1)
Sodium: 140 meq/L (ref 135–145)
Total Bilirubin: 0.6 mg/dL (ref 0.2–1.2)
Total Protein: 7.4 g/dL (ref 6.0–8.3)

## 2023-05-19 LAB — LIPASE: Lipase: 53 U/L (ref 11.0–59.0)

## 2023-05-19 LAB — SEDIMENTATION RATE: Sed Rate: 14 mm/h (ref 0–30)

## 2023-05-19 MED ORDER — AMPHETAMINE-DEXTROAMPHET ER 30 MG PO CP24: 30.0000 mg | ORAL_CAPSULE | ORAL | 0 refills | Status: DC

## 2023-05-19 MED ORDER — ONDANSETRON HCL 4 MG PO TABS: 4.0000 mg | ORAL_TABLET | Freq: Three times a day (TID) | ORAL | 1 refills | Status: AC | PRN

## 2023-05-19 MED ORDER — PANTOPRAZOLE SODIUM 40 MG PO TBEC: 40.0000 mg | DELAYED_RELEASE_TABLET | Freq: Every day | ORAL | 0 refills | Status: DC

## 2023-05-19 MED ORDER — AMPHETAMINE-DEXTROAMPHET ER 30 MG PO CP24: 30.0000 mg | ORAL_CAPSULE | Freq: Every day | ORAL | 0 refills | Status: DC

## 2023-05-19 MED ORDER — DIPHENOXYLATE-ATROPINE 2.5-0.025 MG PO TABS
1.0000 | ORAL_TABLET | Freq: Four times a day (QID) | ORAL | 1 refills | Status: DC | PRN
Start: 2023-05-19 — End: 2023-06-25

## 2023-05-19 NOTE — Assessment & Plan Note (Addendum)
Cont to monitor sx's

## 2023-05-19 NOTE — Assessment & Plan Note (Addendum)
 Nl abd CT in 08/2022 Labs - lipase, CBC, CMET Zofran , Lomotil Check C diff if not well

## 2023-05-19 NOTE — Assessment & Plan Note (Signed)
 Adderall Rx  Potential benefits of a long term amphetamines  use as well as potential risks  and complications were explained to the patient and were aknowledged.

## 2023-05-19 NOTE — Assessment & Plan Note (Signed)
 Nl abd CT in 08/2022 Labs - lipase, CBC, CMET Zofran , Lomotil

## 2023-05-19 NOTE — Progress Notes (Signed)
 Subjective:  Patient ID: Shelly Green, female    DOB: May 18, 1960  Age: 63 y.o. MRN: [id number]  CC: Medical Management of Chronic Issues (3 Month Follow Up. Patient notes gastrointestinal issues for the past 4-5 days. Diarrhea, vomiting, abdominal pain, spasms in intestines, and nausea. Patient notes they had similar issues back in January. Patient has not been able to take medications due to illness)   HPI DARNASIA GARDON presents for Medical Management of Chronic Issues (3 Month Follow Up. Patient notes gastrointestinal issues for the past 4-5 days. Diarrhea, vomiting, abdominal pain, spasms in intestines, and nausea. Patient notes they had similar issues back in January. Patient has not been able to take medications due to illness) F/u on ADD   Outpatient Medications Prior to Visit  Medication Sig Dispense Refill   Albuterol-Budesonide (AIRSUPRA ) 90-80 MCG/ACT AERO Inhale 2 Inhalations into the lungs every 4 (four) hours as needed. 10 g 11   azithromycin  (ZITHROMAX  Z-PAK) 250 MG tablet As directed 6 tablet 0   cariprazine  (VRAYLAR ) 1.5 MG capsule Take 1 capsule (1.5 mg total) by mouth daily. 30 capsule 5   Cholecalciferol (VITAMIN D3) 50 MCG (2000 UT) capsule Take 1 capsule (2,000 Units total) by mouth daily. 100 capsule 3   desvenlafaxine  (PRISTIQ ) 100 MG 24 hr tablet Take 1 tablet (100 mg total) by mouth daily. 90 tablet 3   desvenlafaxine  (PRISTIQ ) 50 MG 24 hr tablet TAKE 1 TABLET BY MOUTH EVERY DAY 90 tablet 1   EMGALITY  120 MG/ML SOAJ Inject into the skin.     Fezolinetant  (VEOZAH ) 45 MG TABS Take 1 tablet (45 mg total) by mouth daily. 30 tablet 5   fluticasone  (FLONASE ) 50 MCG/ACT nasal spray Place 2 sprays into both nostrils daily. 16 g 6   Fluticasone -Umeclidin-Vilant (TRELEGY ELLIPTA ) 100-62.5-25 MCG/ACT AEPB Inhale 1 puff into the lungs daily. 1 each 11   Galcanezumab -gnlm (EMGALITY ) 120 MG/ML SOAJ Inject 120 mg into the skin every 28 (twenty-eight) days. 1.12 mL 11    hyoscyamine  (LEVSIN) 0.125 MG tablet TAKE 1-2 TABLETS (0.125-0.25 MG TOTAL) BY MOUTH EVERY 6 (SIX) HOURS AS NEEDED (HOT FLASHES). 720 tablet 0   ibuprofen  (ADVIL ) 400 MG tablet Take 1 tablet (400 mg total) by mouth every 6 (six) hours as needed. 20 tablet 0   propranolol  (INDERAL ) 20 MG tablet TAKE 1 TABLET BY MOUTH TWICE A DAY 180 tablet 3   SUMAtriptan  (IMITREX ) 100 MG tablet TAKE 1 TAB BY MOUTH DAILY AS NEEDED FOR MIGRAINE MAY REPEAT IN 2HOURS IF HEADACHE PERSISTS OR RECURS 12 tablet 5   amphetamine -dextroamphetamine  (ADDERALL XR) 30 MG 24 hr capsule Take 1 capsule (30 mg total) by mouth every morning. 30 capsule 0   amphetamine -dextroamphetamine  (ADDERALL XR) 30 MG 24 hr capsule Take 1 capsule (30 mg total) by mouth daily. 30 capsule 0   amphetamine -dextroamphetamine  (ADDERALL XR) 30 MG 24 hr capsule Take 1 capsule (30 mg total) by mouth every morning. 30 capsule 0   hydrOXYzine  (ATARAX ) 25 MG tablet Take 1 tablet (25 mg total) by mouth every 8 (eight) hours as needed. (Patient not taking: Reported on 12/25/2022) 60 tablet 2   lamoTRIgine  (LAMICTAL ) 25 MG tablet TAKE 1 TABLET BY MOUTH TWICE A DAY (Patient not taking: Reported on 12/25/2022) 180 tablet 1   No facility-administered medications prior to visit.    ROS: Review of Systems  Constitutional:  Positive for fatigue and unexpected weight change. Negative for activity change, appetite change and chills.  HENT:  Negative for  congestion, mouth sores and sinus pressure.   Eyes:  Negative for visual disturbance.  Respiratory:  Negative for cough and chest tightness.   Gastrointestinal:  Positive for abdominal pain, diarrhea, nausea and vomiting.  Genitourinary:  Negative for difficulty urinating, frequency and vaginal pain.  Musculoskeletal:  Negative for back pain and gait problem.  Skin:  Negative for pallor and rash.  Neurological:  Negative for dizziness, tremors, weakness, numbness and headaches.  Psychiatric/Behavioral:  Negative for  confusion and sleep disturbance.     Objective:  BP (!) 190/118   Pulse (!) 57   Temp 98.1 F (36.7 C)   Ht 5\' 3"  (1.6 m)   Wt 129 lb 9.6 oz (58.8 kg)   SpO2 99%   BMI 22.96 kg/m   BP Readings from Last 3 Encounters:  05/19/23 (!) 190/118  02/17/23 110/70  12/25/22 (!) 152/99    Wt Readings from Last 3 Encounters:  05/19/23 129 lb 9.6 oz (58.8 kg)  02/17/23 137 lb (62.1 kg)  12/25/22 144 lb (65.3 kg)    Physical Exam Constitutional:      General: She is not in acute distress.    Appearance: Normal appearance. She is well-developed.  HENT:     Head: Normocephalic.     Right Ear: External ear normal.     Left Ear: External ear normal.     Nose: Nose normal.  Eyes:     General:        Right eye: No discharge.        Left eye: No discharge.     Conjunctiva/sclera: Conjunctivae normal.     Pupils: Pupils are equal, round, and reactive to light.  Neck:     Thyroid : No thyromegaly.     Vascular: No JVD.     Trachea: No tracheal deviation.  Cardiovascular:     Rate and Rhythm: Normal rate and regular rhythm.     Heart sounds: Normal heart sounds.  Pulmonary:     Effort: No respiratory distress.     Breath sounds: No stridor. No wheezing.  Abdominal:     General: Bowel sounds are normal. There is no distension.     Palpations: Abdomen is soft. There is no mass.     Tenderness: There is no abdominal tenderness. There is no guarding or rebound.  Musculoskeletal:        General: No tenderness.     Cervical back: Normal range of motion and neck supple. No rigidity.  Lymphadenopathy:     Cervical: No cervical adenopathy.  Skin:    Findings: No erythema or rash.  Neurological:     Cranial Nerves: No cranial nerve deficit.     Motor: No abnormal muscle tone.     Coordination: Coordination normal.     Gait: Gait normal.     Deep Tendon Reflexes: Reflexes normal.  Psychiatric:        Behavior: Behavior normal.        Thought Content: Thought content normal.         Judgment: Judgment normal.   Abd is sensitive to palpation  Lab Results  Component Value Date   WBC 8.5 02/20/2023   HGB 16.2 (H) 02/20/2023   HCT 48.1 (H) 02/20/2023   PLT 183.0 02/20/2023   GLUCOSE 101 (H) 02/20/2023   CHOL 203 (H) 02/20/2023   TRIG 263.0 (H) 02/20/2023   HDL 38.20 (L) 02/20/2023   LDLCALC 112 (H) 02/20/2023   ALT 9 02/20/2023   AST 14 02/20/2023  NA 143 02/20/2023   K 3.6 02/20/2023   CL 106 02/20/2023   CREATININE 0.72 02/20/2023   BUN 17 02/20/2023   CO2 28 02/20/2023   TSH 1.24 02/20/2023   PSA 0.00 (L) 02/20/2023   INR 1.02 09/28/2014    CT ABDOMEN PELVIS W CONTRAST Result Date: 09/18/2022 CLINICAL DATA:  History of ventral hernia. Soreness, tenderness on side EXAM: CT ABDOMEN AND PELVIS WITH CONTRAST TECHNIQUE: Multidetector CT imaging of the abdomen and pelvis was performed using the standard protocol following bolus administration of intravenous contrast. RADIATION DOSE REDUCTION: This exam was performed according to the departmental dose-optimization program which includes automated exposure control, adjustment of the mA and/or kV according to patient size and/or use of iterative reconstruction technique. CONTRAST:  100mL ISOVUE -300 IOPAMIDOL  (ISOVUE -300) INJECTION 61% COMPARISON:  09/28/2014 FINDINGS: Lower chest: No acute abnormality Hepatobiliary: No focal liver abnormality is seen. Status post cholecystectomy. No biliary dilatation. Pancreas: No focal abnormality or ductal dilatation. Spleen: No focal abnormality.  Normal size. Adrenals/Urinary Tract: No adrenal abnormality. No focal renal abnormality. No stones or hydronephrosis. Urinary bladder is unremarkable. Stomach/Bowel: Stomach, large and small bowel grossly unremarkable. Vascular/Lymphatic: Scattered aortic atherosclerosis. No evidence of aneurysm or adenopathy. Reproductive: Prior hysterectomy.  No adnexal masses. Other: No free fluid or free air. Small supraumbilical ventral hernia containing  fat, stable. Musculoskeletal: No acute bony abnormality. IMPRESSION: Small supraumbilical ventral hernia containing fat, stable since prior study. No new or additional abdominal hernia. No acute findings in the abdomen or pelvis. Electronically Signed   By: Janeece Mechanic M.D.   On: 09/18/2022 10:07    Assessment & Plan:   Problem List Items Addressed This Visit     Abdominal pain - Primary   Nl abd CT in 08/2022 Labs - lipase, CBC, CMET Zofran , Lomotil      Relevant Orders   CBC with Differential/Platelet   Comprehensive metabolic panel with GFR   Lipase   Urinalysis   Sedimentation rate   IBS (irritable bowel syndrome)   Nl abd CT in 08/2022 Labs - lipase, CBC, CMET Zofran , Lomotil      Relevant Medications   ondansetron  (ZOFRAN ) 4 MG tablet   diphenoxylate-atropine (LOMOTIL) 2.5-0.025 MG tablet   pantoprazole  (PROTONIX ) 40 MG tablet   ADD (attention deficit disorder)   Adderall Rx  Potential benefits of a long term amphetamines  use as well as potential risks  and complications were explained to the patient and were aknowledged.      Hot flashes related to aromatase inhibitor therapy   Cont to monitor sx's      Diarrhea   Nl abd CT in 08/2022 Labs - lipase, CBC, CMET Zofran , Lomotil Check C diff if not well      Relevant Orders   CBC with Differential/Platelet   Comprehensive metabolic panel with GFR   Lipase   Urinalysis   Sedimentation rate   Clostridium difficile EIA   Fecal lactoferrin, quant      Meds ordered this encounter  Medications   ondansetron  (ZOFRAN ) 4 MG tablet    Sig: Take 1 tablet (4 mg total) by mouth every 8 (eight) hours as needed for nausea or vomiting.    Dispense:  20 tablet    Refill:  1   diphenoxylate-atropine (LOMOTIL) 2.5-0.025 MG tablet    Sig: Take 1 tablet by mouth 4 (four) times daily as needed for diarrhea or loose stools.    Dispense:  60 tablet    Refill:  1  amphetamine -dextroamphetamine  (ADDERALL XR) 30 MG 24 hr  capsule    Sig: Take 1 capsule (30 mg total) by mouth every morning.    Dispense:  30 capsule    Refill:  0    Please fill on or after 05/19/23   amphetamine -dextroamphetamine  (ADDERALL XR) 30 MG 24 hr capsule    Sig: Take 1 capsule (30 mg total) by mouth daily.    Dispense:  30 capsule    Refill:  0    Please fill on or after 06/18/23   amphetamine -dextroamphetamine  (ADDERALL XR) 30 MG 24 hr capsule    Sig: Take 1 capsule (30 mg total) by mouth every morning.    Dispense:  30 capsule    Refill:  0    Please fill on or after 07/18/23   pantoprazole  (PROTONIX ) 40 MG tablet    Sig: Take 1 tablet (40 mg total) by mouth daily.    Dispense:  90 tablet    Refill:  0      Follow-up: Return in about 3 months (around 08/18/2023) for a follow-up visit.  Anitra Barn, MD

## 2023-06-24 NOTE — Progress Notes (Unsigned)
 NEUROLOGY FOLLOW UP OFFICE NOTE  Shelly Green [id number]  Assessment/Plan:   Migraine without aura, with status migrainosus, not intractable    Migraine prevention:  Emgality .  Migraine rescue:  sumatriptan  tablet Limit use of pain relievers to no more than 9 days out of the month to prevent risk of rebound or medication-overuse headache. Keep headache diary Follow up one year     Subjective:  Shelly Green is a 63 year old right-handed female with migraines, ADD, anxiety, arthritis, IBS, migraines and history of breast cancer in remission who follows up for migraine.   UPDATE: Still doing well.   Intensity:  moderate Duration:  1 hour with sumatriptan  Frequency:  1 to 2 in past 6 months Rescue protocol:  sumatriptan  Current NSAIDS/analgesics:  Tylenol , ASA Current triptans:  sumatriptan  100mg  Current ergotamine:  none Current anti-emetic:  none Current muscle relaxants:  none Current Antihypertensive medications:  propranolol  20mg  twice daily Current Antidepressant medications: desvenlafaxine  Current Anticonvulsant medications:  lamotrigine  Current anti-CGRP:  Emgality  Current Vitamins/Herbal/Supplements:  MVI, D Current Antihistamines/Decongestants:  none Other therapy:  OMM Hormone/birth control:  Femara Other medications:  Adderall XR, hydroxyzine    Caffeine :  No coffee.  Maybe 1 small coca cola daily Exercise:  yes Depression:  yes; Anxiety:  none Other pain:  Chronic low back pain Sleep:  Poor.  Hot flashes at night.    HISTORY:  Started having headaches as a child.  They were moderate to severe bifrontal pressupe to throbbing.  They are associated with nausea, photophobia and phonophobia.  If takes something early and lay down, they will last within an hour, otherwise all day.  They occur at least 2 to 3 days a week.  Triggers include change in weather and seasonal allergies.  Relieving factors include rest.    Increased after starting Wellbutrin .   Severe pain in jaw and back of head.  Neck pain.  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  Associated with nausea, vomiting, photophobia and phonophobia.  They were daily.  Stopped Wellbutrin  after 5 days.  Since stopping Wellbutrin , headaches decreased to once a week and now back to baseline.       Past NSAIDS/analgesics:  Diclofenac , tramadol , ibuprofen , Fioricet Past abortive triptans:  rizatriptan  Past abortive ergotamine:  none Past muscle relaxants:  none Past anti-emetic:  Zofran , Compazine  Past antihypertensive medications:  none Past antidepressant medications:  Cymbalta, Wellbutrin  (aggravated headaches), paroxetine, fluoxetine , Trintellix Past anticonvulsant medications:  topiramate , gabapentin Past anti-CGRP:  none.  Would avoid Aimovig due to history of uncontrolled hypertension  Past vitamins/Herbal/Supplements:  none Past antihistamines/decongestants:  Benadryl , Flonase , Claritin  Other past therapies:  none     Family history of headache:  Maternal grandmother, mother, sisters  PAST MEDICAL HISTORY: Past Medical History:  Diagnosis Date   Arthritis    Cancer (HCC)    breast    History of colon polyps    HTN (hypertension) 07/31/2021   IBS (irritable bowel syndrome)    Menopausal disorder    Migraines    Seasonal allergies    Social anxiety disorder     MEDICATIONS: Current Outpatient Medications on File Prior to Visit  Medication Sig Dispense Refill   Albuterol-Budesonide (AIRSUPRA ) 90-80 MCG/ACT AERO Inhale 2 Inhalations into the lungs every 4 (four) hours as needed. 10 g 11   amphetamine -dextroamphetamine  (ADDERALL XR) 30 MG 24 hr capsule Take 1 capsule (30 mg total) by mouth every morning. 30 capsule  0   amphetamine -dextroamphetamine  (ADDERALL XR) 30 MG 24 hr capsule Take 1 capsule (30 mg total) by mouth daily. 30 capsule 0   amphetamine -dextroamphetamine  (ADDERALL XR) 30 MG  24 hr capsule Take 1 capsule (30 mg total) by mouth every morning. 30 capsule 0   azithromycin  (ZITHROMAX  Z-PAK) 250 MG tablet As directed 6 tablet 0   cariprazine  (VRAYLAR ) 1.5 MG capsule Take 1 capsule (1.5 mg total) by mouth daily. 30 capsule 5   Cholecalciferol (VITAMIN D3) 50 MCG (2000 UT) capsule Take 1 capsule (2,000 Units total) by mouth daily. 100 capsule 3   desvenlafaxine  (PRISTIQ ) 100 MG 24 hr tablet Take 1 tablet (100 mg total) by mouth daily. 90 tablet 3   desvenlafaxine  (PRISTIQ ) 50 MG 24 hr tablet TAKE 1 TABLET BY MOUTH EVERY DAY 90 tablet 1   diphenoxylate -atropine  (LOMOTIL ) 2.5-0.025 MG tablet Take 1 tablet by mouth 4 (four) times daily as needed for diarrhea or loose stools. 60 tablet 1   EMGALITY  120 MG/ML SOAJ Inject into the skin.     Fezolinetant  (VEOZAH ) 45 MG TABS Take 1 tablet (45 mg total) by mouth daily. 30 tablet 5   fluticasone  (FLONASE ) 50 MCG/ACT nasal spray Place 2 sprays into both nostrils daily. 16 g 6   Fluticasone -Umeclidin-Vilant (TRELEGY ELLIPTA ) 100-62.5-25 MCG/ACT AEPB Inhale 1 puff into the lungs daily. 1 each 11   Galcanezumab -gnlm (EMGALITY ) 120 MG/ML SOAJ Inject 120 mg into the skin every 28 (twenty-eight) days. 1.12 mL 11   hydrOXYzine  (ATARAX ) 25 MG tablet Take 1 tablet (25 mg total) by mouth every 8 (eight) hours as needed. (Patient not taking: Reported on 12/25/2022) 60 tablet 2   hyoscyamine  (LEVSIN) 0.125 MG tablet TAKE 1-2 TABLETS (0.125-0.25 MG TOTAL) BY MOUTH EVERY 6 (SIX) HOURS AS NEEDED (HOT FLASHES). 720 tablet 0   ibuprofen  (ADVIL ) 400 MG tablet Take 1 tablet (400 mg total) by mouth every 6 (six) hours as needed. 20 tablet 0   lamoTRIgine  (LAMICTAL ) 25 MG tablet TAKE 1 TABLET BY MOUTH TWICE A DAY (Patient not taking: Reported on 12/25/2022) 180 tablet 1   ondansetron  (ZOFRAN ) 4 MG tablet Take 1 tablet (4 mg total) by mouth every 8 (eight) hours as needed for nausea or vomiting. 20 tablet 1   pantoprazole  (PROTONIX ) 40 MG tablet Take 1 tablet  (40 mg total) by mouth daily. 90 tablet 0   propranolol  (INDERAL ) 20 MG tablet TAKE 1 TABLET BY MOUTH TWICE A DAY 180 tablet 3   SUMAtriptan  (IMITREX ) 100 MG tablet TAKE 1 TAB BY MOUTH DAILY AS NEEDED FOR MIGRAINE MAY REPEAT IN 2HOURS IF HEADACHE PERSISTS OR RECURS 12 tablet 5   No current facility-administered medications on file prior to visit.      ALLERGIES: Allergies  Allergen Reactions   Codeine Anaphylaxis   Hydrocodone Shortness Of Breath   Penicillins Anaphylaxis    As a child Has patient had a PCN reaction causing immediate rash, facial/tongue/throat swelling, SOB or lightheadedness with hypotension: yes Has patient had a PCN reaction causing severe rash involving mucus membranes or skin necrosis: unknown Has patient had a PCN reaction that required hospitalization: yes Has patient had a PCN reaction occurring within the last 10 years: no If all of the above answers are "NO", then may proceed with Cephalosporin use.    Pholcodine Other (See Comments)   Sulfa Antibiotics Anaphylaxis and Rash   Tramadol  Shortness Of Breath   Abilify  [Aripiprazole ]     Sick    Clindamycin/Lincomycin  rush   Effexor  [Venlafaxine ]     Sleepy, heavy arms   Fluoxetine      Feeling worse   Lamictal  [Lamotrigine ]     depression   Olanzapine      Too sleepy   Wellbutrin  [Bupropion ]     HAs, anger    FAMILY HISTORY: Family History  Problem Relation Age of Onset   Arthritis Mother    Lymphoma Mother    Colon cancer Sister 49   Mental illness Daughter    Mental illness Maternal Aunt    Arthritis Maternal Grandmother    Hypertension Maternal Grandfather       Objective:  Blood pressure (!) 150/82, pulse 74, height 5\' 2"  (1.575 m), weight 129 lb (58.5 kg), SpO2 98%. General: No acute distress.  Patient appears well-groomed.   Head:  Normocephalic/atraumatic Neck:  Supple.  No paraspinal tenderness.  Full range of motion. Heart:  Regular rate and rhythm. Neuro:  Alert and  oriented.  Speech fluent and not dysarthric.  Language intact.  CN II-XII intact.  Bulk and tone normal.  Muscle strength 5/5 throughout.  Sensation to light touch intact.  Deep tendon reflexes 2+ throughout, toes downgoing.  Gait normal.  Romberg negative.   Janne Members, DO  CC: Adelaide Holy, MD

## 2023-06-25 ENCOUNTER — Encounter: Payer: Self-pay | Admitting: Neurology

## 2023-06-25 ENCOUNTER — Ambulatory Visit: Payer: Managed Care, Other (non HMO) | Admitting: Neurology

## 2023-06-25 VITALS — BP 150/82 | HR 74 | Ht 62.0 in | Wt 129.0 lb

## 2023-06-25 DIAGNOSIS — G43009 Migraine without aura, not intractable, without status migrainosus: Secondary | ICD-10-CM

## 2023-06-25 NOTE — Patient Instructions (Addendum)
 Emgality  Sumatriptan  as needed. Limit use of pain relievers to no more than 9 days out of the month to prevent risk of rebound or medication-overuse headache. Keep headache diary Follow up one year

## 2023-08-06 ENCOUNTER — Encounter: Payer: Self-pay | Admitting: Internal Medicine

## 2023-08-06 ENCOUNTER — Other Ambulatory Visit: Payer: Self-pay | Admitting: Neurology

## 2023-08-08 ENCOUNTER — Telehealth: Payer: Self-pay | Admitting: Neurology

## 2023-08-08 NOTE — Telephone Encounter (Signed)
 Copied from CRM [id number]. Topic: Clinical - Medication Question >> Aug 08, 2023  8:42 AM Viola F wrote: Reason for CRM: patient called to follow up on MyChart message from 08/07/23 regarding the Desvenlafaxine  Succnt er 50 MG medication. I let her know that we are waiting for Dr. Garald to respond - she prefers response via MyChart.

## 2023-08-08 NOTE — Telephone Encounter (Signed)
 1. Which medications need refilled? (List name and dosage, if known) emgality   2. Which pharmacy/location is medication to be sent to? (include street and city if local pharmacy) [facility] [address]

## 2023-08-08 NOTE — Telephone Encounter (Signed)
 Advise rx sent

## 2023-08-14 ENCOUNTER — Other Ambulatory Visit: Payer: Self-pay | Admitting: Internal Medicine

## 2023-08-20 ENCOUNTER — Ambulatory Visit: Admitting: Internal Medicine

## 2023-08-20 ENCOUNTER — Encounter: Payer: Self-pay | Admitting: Internal Medicine

## 2023-08-20 VITALS — BP 160/90 | HR 73 | Temp 98.6°F | Ht 62.0 in | Wt 125.5 lb

## 2023-08-20 DIAGNOSIS — F909 Attention-deficit hyperactivity disorder, unspecified type: Secondary | ICD-10-CM | POA: Diagnosis not present

## 2023-08-20 DIAGNOSIS — R61 Generalized hyperhidrosis: Secondary | ICD-10-CM | POA: Insufficient documentation

## 2023-08-20 MED ORDER — AMPHETAMINE-DEXTROAMPHET ER 10 MG PO CP24: ORAL_CAPSULE | ORAL | 0 refills | Status: DC

## 2023-08-20 NOTE — Progress Notes (Signed)
 Subjective:  Patient ID: Shelly Green, female    DOB: 02/06/1960  Age: 63 y.o. MRN: [id number]  CC:   HPI CHINELO BENN presents for ADD (Pt here for f/u ADD, needs refills. Currently taking Adderall XR 30 mg. Pt says everyday around 4:00 PM she gets tired and feels like medicine is not working.) C/o excessive sweating - worse  Outpatient Medications Prior to Visit  Medication Sig Dispense Refill   Albuterol-Budesonide (AIRSUPRA ) 90-80 MCG/ACT AERO Inhale 2 Inhalations into the lungs every 4 (four) hours as needed. 10 g 11   Cholecalciferol (VITAMIN D3) 50 MCG (2000 UT) capsule Take 1 capsule (2,000 Units total) by mouth daily. 100 capsule 3   Galcanezumab -gnlm (EMGALITY ) 120 MG/ML SOAJ INJECT 120 MG INTO THE SKIN EVERY 28 (TWENTY-EIGHT) DAYS. 1 mL 3   ibuprofen  (ADVIL ) 400 MG tablet Take 1 tablet (400 mg total) by mouth every 6 (six) hours as needed. 20 tablet 0   montelukast  (SINGULAIR ) 10 MG tablet Take 10 mg by mouth daily.     ondansetron  (ZOFRAN ) 4 MG tablet Take 1 tablet (4 mg total) by mouth every 8 (eight) hours as needed for nausea or vomiting. 20 tablet 1   propranolol  (INDERAL ) 20 MG tablet TAKE 1 TABLET BY MOUTH TWICE A DAY 180 tablet 3   SUMAtriptan  (IMITREX ) 100 MG tablet TAKE 1 TAB BY MOUTH DAILY AS NEEDED FOR MIGRAINE MAY REPEAT IN 2HOURS IF HEADACHE PERSISTS OR RECURS 12 tablet 5   amphetamine -dextroamphetamine  (ADDERALL XR) 30 MG 24 hr capsule Take 1 capsule (30 mg total) by mouth every morning. 30 capsule 0   amphetamine -dextroamphetamine  (ADDERALL XR) 30 MG 24 hr capsule Take 1 capsule (30 mg total) by mouth daily. 30 capsule 0   amphetamine -dextroamphetamine  (ADDERALL XR) 30 MG 24 hr capsule Take 1 capsule (30 mg total) by mouth every morning. 30 capsule 0   EMGALITY  120 MG/ML SOAJ Inject into the skin.     No facility-administered medications prior to visit.    ROS: Review of Systems  Constitutional:  Positive for diaphoresis and fatigue. Negative for  activity change, appetite change, chills and unexpected weight change.  HENT:  Negative for congestion, mouth sores and sinus pressure.   Eyes:  Negative for visual disturbance.  Respiratory:  Negative for cough and chest tightness.   Gastrointestinal:  Negative for abdominal pain and nausea.  Genitourinary:  Negative for difficulty urinating, frequency and vaginal pain.  Musculoskeletal:  Negative for back pain and gait problem.  Skin:  Negative for pallor and rash.  Neurological:  Negative for dizziness, tremors, weakness, numbness and headaches.  Psychiatric/Behavioral:  Negative for confusion, sleep disturbance and suicidal ideas.     Objective:  BP (!) 160/90 (BP Location: Right Arm, Patient Position: Sitting, Cuff Size: Normal)   Pulse 73   Temp 98.6 F (37 C) (Oral)   Ht 5' 2 (1.575 m)   Wt 125 lb 8 oz (56.9 kg)   SpO2 98%   BMI 22.95 kg/m   BP Readings from Last 3 Encounters:  08/20/23 (!) 160/90  06/25/23 (!) 150/82  05/19/23 (!) 190/118    Wt Readings from Last 3 Encounters:  08/20/23 125 lb 8 oz (56.9 kg)  06/25/23 129 lb (58.5 kg)  05/19/23 129 lb 9.6 oz (58.8 kg)    Physical Exam Constitutional:      General: She is not in acute distress.    Appearance: Normal appearance. She is well-developed.  HENT:     Head: Normocephalic.  Right Ear: External ear normal.     Left Ear: External ear normal.     Nose: Nose normal.  Eyes:     General:        Right eye: No discharge.        Left eye: No discharge.     Conjunctiva/sclera: Conjunctivae normal.     Pupils: Pupils are equal, round, and reactive to light.  Neck:     Thyroid : No thyromegaly.     Vascular: No JVD.     Trachea: No tracheal deviation.  Cardiovascular:     Rate and Rhythm: Normal rate and regular rhythm.     Heart sounds: Normal heart sounds.  Pulmonary:     Effort: No respiratory distress.     Breath sounds: No stridor. No wheezing.  Abdominal:     General: Bowel sounds are normal.  There is no distension.     Palpations: Abdomen is soft. There is no mass.     Tenderness: There is no abdominal tenderness. There is no guarding or rebound.  Musculoskeletal:        General: No tenderness.     Cervical back: Normal range of motion and neck supple. No rigidity.     Right lower leg: No edema.     Left lower leg: No edema.  Lymphadenopathy:     Cervical: No cervical adenopathy.  Skin:    Findings: No erythema or rash.  Neurological:     Cranial Nerves: No cranial nerve deficit.     Motor: No abnormal muscle tone.     Coordination: Coordination normal.     Deep Tendon Reflexes: Reflexes normal.  Psychiatric:        Behavior: Behavior normal.        Thought Content: Thought content normal.        Judgment: Judgment normal.     Lab Results  Component Value Date   WBC 10.3 05/19/2023   HGB 16.2 (H) 05/19/2023   HCT 47.9 (H) 05/19/2023   PLT 204.0 05/19/2023   GLUCOSE 130 (H) 05/19/2023   CHOL 203 (H) 02/20/2023   TRIG 263.0 (H) 02/20/2023   HDL 38.20 (L) 02/20/2023   LDLCALC 112 (H) 02/20/2023   ALT 13 05/19/2023   AST 17 05/19/2023   NA 140 05/19/2023   K 3.6 05/19/2023   CL 102 05/19/2023   CREATININE 0.72 05/19/2023   BUN 11 05/19/2023   CO2 29 05/19/2023   TSH 1.24 02/20/2023   PSA 0.00 (L) 02/20/2023   INR 1.02 09/28/2014    CT ABDOMEN PELVIS W CONTRAST Result Date: 09/18/2022 CLINICAL DATA:  History of ventral hernia. Soreness, tenderness on side EXAM: CT ABDOMEN AND PELVIS WITH CONTRAST TECHNIQUE: Multidetector CT imaging of the abdomen and pelvis was performed using the standard protocol following bolus administration of intravenous contrast. RADIATION DOSE REDUCTION: This exam was performed according to the departmental dose-optimization program which includes automated exposure control, adjustment of the mA and/or kV according to patient size and/or use of iterative reconstruction technique. CONTRAST:  ISOVUE -300 IOPAMIDOL  (ISOVUE -300)  INJECTION 61% COMPARISON:  09/28/2014 FINDINGS: Lower chest: No acute abnormality Hepatobiliary: No focal liver abnormality is seen. Status post cholecystectomy. No biliary dilatation. Pancreas: No focal abnormality or ductal dilatation. Spleen: No focal abnormality.  Normal size. Adrenals/Urinary Tract: No adrenal abnormality. No focal renal abnormality. No stones or hydronephrosis. Urinary bladder is unremarkable. Stomach/Bowel: Stomach, large and small bowel grossly unremarkable. Vascular/Lymphatic: Scattered aortic atherosclerosis. No evidence of aneurysm or adenopathy. Reproductive:  Prior hysterectomy.  No adnexal masses. Other: No free fluid or free air. Small supraumbilical ventral hernia containing fat, stable. Musculoskeletal: No acute bony abnormality. IMPRESSION: Small supraumbilical ventral hernia containing fat, stable since prior study. No new or additional abdominal hernia. No acute findings in the abdomen or pelvis. Electronically Signed   By: Franky Crease M.D.   On: 09/18/2022 10:07    Assessment & Plan:   Problem List Items Addressed This Visit     ADD (attention deficit disorder) - Primary   Currently taking Adderall XR 30 mg in am. Pt says everyday around 4:00 PM she gets tired and feels like medicine is not working.Pt wants to try Adderall XR 20 mg in am, 10 mg at lunch      Sweating profusely   Relevant Orders   Ambulatory referral to Dermatology      Meds ordered this encounter  Medications   amphetamine -dextroamphetamine  (ADDERALL XR) 10 MG 24 hr capsule    Sig: Take Adderall XR 20 mg in am, 10 mg at lunch.    Dispense:  90 capsule    Refill:  0    Please fill on or after 09/19/23   amphetamine -dextroamphetamine  (ADDERALL XR) 10 MG 24 hr capsule    Sig: Take Adderall XR 20 mg in am, 10 mg at lunch.    Dispense:  90 capsule    Refill:  0    Please fill on or after 10/19/23      Follow-up: Return in about 3 months (around 11/20/2023) for a follow-up visit.  Marolyn Noel, MD

## 2023-08-20 NOTE — Assessment & Plan Note (Signed)
 Currently taking Adderall XR 30 mg in am. Pt says everyday around 4:00 PM she gets tired and feels like medicine is not working.Pt wants to try Adderall XR 20 mg in am, 10 mg at lunch

## 2023-09-19 ENCOUNTER — Encounter: Payer: Self-pay | Admitting: Internal Medicine

## 2023-09-20 ENCOUNTER — Other Ambulatory Visit: Payer: Self-pay | Admitting: Internal Medicine

## 2023-09-28 ENCOUNTER — Other Ambulatory Visit: Payer: Self-pay | Admitting: Internal Medicine

## 2023-10-16 ENCOUNTER — Ambulatory Visit: Payer: Self-pay

## 2023-10-16 NOTE — Telephone Encounter (Signed)
 FYI Only or Action Required?: Action required by provider: request for appointment.  Patient was last seen in primary care on 08/20/2023 by Plotnikov, Karlynn GAILS, MD.  Called Nurse Triage reporting Vomiting.  Symptoms began several weeks ago.  Interventions attempted: Rest, hydration, or home remedies.  Symptoms are: unchanged.Has had vomiting after eating for awhile. Has abdominal pain after eating. Lost weight, unsure how much.  Triage Disposition: No disposition on file.  Patient/caregiver understands and will follow disposition?: yes    Copied from CRM #[id number]. Topic: Clinical - Red Word Triage >> Oct 16, 2023  9:11 AM Franky GRADE wrote: Red Word that prompted transfer to Nurse Triage: Patient is unable to eat due to nausea and vomiting and when she is able to eat she experiencing severe abdomen pain afterwards and passing a bowel movement is unbearable. Reason for Disposition  [1] MILD or MODERATE vomiting AND [2] present > 48 hours (2 days)  (Exception: Mild vomiting with associated diarrhea.)  Answer Assessment - Initial Assessment Questions 1. VOMITING SEVERITY: How many times have you vomited in the past 24 hours?      When she eats 2. ONSET: When did the vomiting begin?      3 days 3. FLUIDS: What fluids or food have you vomited up today? Have you been able to keep any fluids down?     Coke 4. ABDOMEN PAIN: Are your having any abdomen pain? If Yes : How bad is it and what does it feel like? (e.g., crampy, dull, intermittent, constant)      Comes and goes 5. DIARRHEA: Is there any diarrhea? If Yes, ask: How many times today?      no 6. CONTACTS: Is there anyone else in the family with the same symptoms?      no 7. CAUSE: What do you think is causing your vomiting?     unsure 8. HYDRATION STATUS: Any signs of dehydration? (e.g., dry mouth [not only dry lips], too weak to stand) When did you last urinate?     yes 9. OTHER SYMPTOMS: Do you have any  other symptoms? (e.g., fever, headache, vertigo, vomiting blood or coffee grounds, recent head injury)     no 10. PREGNANCY: Is there any chance you are pregnant? When was your last menstrual period?       no  Protocols used: Vomiting-A-AH

## 2023-10-17 ENCOUNTER — Ambulatory Visit: Admitting: Internal Medicine

## 2023-11-10 ENCOUNTER — Ambulatory Visit: Admitting: Sports Medicine

## 2023-11-10 VITALS — HR 69 | Ht 62.0 in | Wt 125.0 lb

## 2023-11-10 DIAGNOSIS — M545 Low back pain, unspecified: Secondary | ICD-10-CM | POA: Diagnosis not present

## 2023-11-10 DIAGNOSIS — G8929 Other chronic pain: Secondary | ICD-10-CM | POA: Diagnosis not present

## 2023-11-10 DIAGNOSIS — M533 Sacrococcygeal disorders, not elsewhere classified: Secondary | ICD-10-CM | POA: Diagnosis not present

## 2023-11-10 MED ORDER — MELOXICAM 15 MG PO TABS: ORAL_TABLET | ORAL | 0 refills | Status: AC

## 2023-11-10 NOTE — Progress Notes (Signed)
 Ben Justa Hatchell D.[facility] [address] [place] [address] Phone: [phone]   Assessment and Plan:     1. Chronic bilateral low back pain without sciatica (Primary) 2. Chronic right SI joint pain -Chronic with exacerbation, subsequent visit - Recurrence of low back pain primarily in right lower lumbar region and right SI joint without specific MOI x 3 weeks. - Start meloxicam 15 mg daily x2 weeks.  If still having pain after 2 weeks, complete 3rd-week of NSAID. May use remaining NSAID as needed once daily for pain control.  Do not to use additional over-the-counter NSAIDs (ibuprofen , naproxen , Advil , Aleve , etc.) while taking prescription NSAIDs.  May use Tylenol  [phone] mg 2 to 3 times a day for breakthrough pain. -Start HEP for low back   15 additional minutes spent for educating Therapeutic Home Exercise Program.  This included exercises focusing on stretching, strengthening, with focus on eccentric aspects.   Long term goals include an improvement in range of motion, strength, endurance as well as avoiding reinjury. Patient's frequency would include in 1-2 times a day, 3-5 times a week for a duration of 6-12 weeks. Proper technique shown and discussed handout in great detail with ATC.  All questions were discussed and answered.    Pertinent previous records reviewed include none   Follow Up: 4 weeks for reevaluation.  If no improvement or worsening of symptoms, could discuss advanced imaging versus OMT versus SI joint CSI   Subjective:   I, Shelly Green, am serving as a Neurosurgeon for Doctor Morene Mace  Chief Complaint: low back pain   HPI:   11/10/23 Patient is a 63 year old female with low back pain. Patient states pain started 3 weeks ago. No MOI. Pain is intermittent that really flared last week. Was seen in UC last week. Tylenol  and ibu for the pain. Started meloxicam today. Was also given a muscle relaxer. Doesn't know if it  helps. Pain radiates to the hip and higher up. Decreased ROM.   Relevant Historical Information: COPD, hypertension, GERD, IBS, osteoporosis, history of breast cancer  Additional pertinent review of systems negative.   Current Outpatient Medications:    meloxicam (MOBIC) 15 MG tablet, Take 1 tablet daily for 2 weeks.  If still in pain after 2 weeks, take 1 tablet daily for an additional 1 week., Disp: 30 tablet, Rfl: 0   Albuterol-Budesonide (AIRSUPRA ) 90-80 MCG/ACT AERO, Inhale 2 Inhalations into the lungs every 4 (four) hours as needed., Disp: 10 g, Rfl: 11   amphetamine -dextroamphetamine  (ADDERALL XR) 10 MG 24 hr capsule, Take Adderall XR 20 mg in am, 10 mg at lunch., Disp: 90 capsule, Rfl: 0   amphetamine -dextroamphetamine  (ADDERALL XR) 10 MG 24 hr capsule, Take Adderall XR 20 mg in am, 10 mg at lunch., Disp: 90 capsule, Rfl: 0   Cholecalciferol (VITAMIN D3) 50 MCG (2000 UT) capsule, Take 1 capsule (2,000 Units total) by mouth daily., Disp: 100 capsule, Rfl: 3   desvenlafaxine  (PRISTIQ ) 100 MG 24 hr tablet, TAKE 1 TABLET BY MOUTH ONCE DAILY, Disp: 90 tablet, Rfl: 3   Galcanezumab -gnlm (EMGALITY ) 120 MG/ML SOAJ, INJECT 120 MG INTO THE SKIN EVERY 28 (TWENTY-EIGHT) DAYS., Disp: 1 mL, Rfl: 3   ibuprofen  (ADVIL ) 400 MG tablet, Take 1 tablet (400 mg total) by mouth every 6 (six) hours as needed., Disp: 20 tablet, Rfl: 0   montelukast  (SINGULAIR ) 10 MG tablet, Take 10 mg by mouth daily., Disp: , Rfl:    ondansetron  (ZOFRAN )  4 MG tablet, Take 1 tablet (4 mg total) by mouth every 8 (eight) hours as needed for nausea or vomiting., Disp: 20 tablet, Rfl: 1   propranolol  (INDERAL ) 20 MG tablet, TAKE 1 TABLET BY MOUTH TWICE A DAY, Disp: 180 tablet, Rfl: 3   SUMAtriptan  (IMITREX ) 100 MG tablet, TAKE 1 TAB BY MOUTH DAILY AS NEEDED FOR MIGRAINE MAY REPEAT IN 2HOURS IF HEADACHE PERSISTS OR RECURS, Disp: 12 tablet, Rfl: 5   Objective:     Vitals:   11/10/23 1535  Pulse: 69  SpO2: 96%  Weight: 125 lb  (56.7 kg)  Height: 5' 2 (1.575 m)      Body mass index is 22.86 kg/m.    Physical Exam:    Gen: Appears well, nad, nontoxic and pleasant Psych: Alert and oriented, appropriate mood and affect Neuro: sensation intact, strength is 5/5 in upper and lower extremities, muscle tone wnl Skin: no susupicious lesions or rashes  Back - Normal skin, Spine with normal alignment and no deformity.   No tenderness to vertebral process palpation.   Bilateral lumbar paraspinous muscles are    tender and without spasm, worse on right TTP right sacral base NTTP gluteal musculature Straight leg raise negative Trendelenberg negative Piriformis Test negative Gait normal  Nonradiating low back pain with lumbar flexion and extension  Electronically signed by:  Odis Mace D.[facility] 3:56 PM 11/10/23

## 2023-11-10 NOTE — Patient Instructions (Signed)
-   Start meloxicam 15 mg daily x2 weeks.  If still having pain after 2 weeks, complete 3rd-week of NSAID. May use remaining NSAID as needed once daily for pain control.  Do not to use additional over-the-counter NSAIDs (ibuprofen, naproxen, Advil, Aleve, etc.) while taking prescription NSAIDs.  May use Tylenol [phone] mg 2 to 3 times a day for breakthrough pain. Low back HEP  4 week follow up

## 2023-11-20 ENCOUNTER — Ambulatory Visit: Admitting: Internal Medicine

## 2023-11-20 ENCOUNTER — Encounter: Payer: Self-pay | Admitting: Internal Medicine

## 2023-11-20 VITALS — BP 138/96 | HR 60 | Temp 98.2°F | Ht 62.0 in | Wt 121.8 lb

## 2023-11-20 DIAGNOSIS — R1084 Generalized abdominal pain: Secondary | ICD-10-CM | POA: Diagnosis not present

## 2023-11-20 DIAGNOSIS — R61 Generalized hyperhidrosis: Secondary | ICD-10-CM | POA: Diagnosis not present

## 2023-11-20 DIAGNOSIS — F419 Anxiety disorder, unspecified: Secondary | ICD-10-CM

## 2023-11-20 DIAGNOSIS — K439 Ventral hernia without obstruction or gangrene: Secondary | ICD-10-CM

## 2023-11-20 DIAGNOSIS — R112 Nausea with vomiting, unspecified: Secondary | ICD-10-CM

## 2023-11-20 LAB — CBC WITH DIFFERENTIAL/PLATELET
Basophils Absolute: 0.1 K/uL (ref 0.0–0.1)
Basophils Relative: 0.7 % (ref 0.0–3.0)
Eosinophils Absolute: 0.1 K/uL (ref 0.0–0.7)
Eosinophils Relative: 1.3 % (ref 0.0–5.0)
HCT: 45.2 % (ref 36.0–46.0)
Hemoglobin: 15.3 g/dL — ABNORMAL HIGH (ref 12.0–15.0)
Lymphocytes Relative: 28.2 % (ref 12.0–46.0)
Lymphs Abs: 2.2 K/uL (ref 0.7–4.0)
MCHC: 33.8 g/dL (ref 30.0–36.0)
MCV: 95.5 fl (ref 78.0–100.0)
Monocytes Absolute: 0.5 K/uL (ref 0.1–1.0)
Monocytes Relative: 6.3 % (ref 3.0–12.0)
Neutro Abs: 4.9 K/uL (ref 1.4–7.7)
Neutrophils Relative %: 63.5 % (ref 43.0–77.0)
Platelets: 170 K/uL (ref 150.0–400.0)
RBC: 4.73 Mil/uL (ref 3.87–5.11)
RDW: 13.1 % (ref 11.5–15.5)
WBC: 7.7 K/uL (ref 4.0–10.5)

## 2023-11-20 LAB — COMPREHENSIVE METABOLIC PANEL WITH GFR
ALT: 15 U/L (ref 0–35)
AST: 18 U/L (ref 0–37)
Albumin: 4.5 g/dL (ref 3.5–5.2)
Alkaline Phosphatase: 88 U/L (ref 39–117)
BUN: 17 mg/dL (ref 6–23)
CO2: 28 meq/L (ref 19–32)
Calcium: 9 mg/dL (ref 8.4–10.5)
Chloride: 107 meq/L (ref 96–112)
Creatinine, Ser: 0.65 mg/dL (ref 0.40–1.20)
GFR: 93.91 mL/min (ref >60.00)
Glucose, Bld: 102 mg/dL — ABNORMAL HIGH (ref 70–99)
Potassium: 3.6 meq/L (ref 3.5–5.1)
Sodium: 142 meq/L (ref 135–145)
Total Bilirubin: 0.4 mg/dL (ref 0.2–1.2)
Total Protein: 6.9 g/dL (ref 6.0–8.3)

## 2023-11-20 LAB — LIPASE: Lipase: 53 U/L (ref 11.0–59.0)

## 2023-11-20 MED ORDER — PANTOPRAZOLE SODIUM 40 MG PO TBEC: 40.0000 mg | DELAYED_RELEASE_TABLET | Freq: Every day | ORAL | 3 refills | Status: AC

## 2023-11-20 NOTE — Assessment & Plan Note (Signed)
 Off Pristiq , off ADD meds

## 2023-11-20 NOTE — Progress Notes (Signed)
 Subjective:  Patient ID: Verneita DELENA Cumming, female    DOB: Jan 23, 1960  Age: 63 y.o. MRN: [id number]  CC: Medical Management of Chronic Issues (3 Month follow up)   HPI RODERICK SWEEZY presents for LBP, n/v, depression,  EGD/colonoscopy pending  Outpatient Medications Prior to Visit  Medication Sig Dispense Refill   Albuterol-Budesonide (AIRSUPRA ) 90-80 MCG/ACT AERO Inhale 2 Inhalations into the lungs every 4 (four) hours as needed. 10 g 11   Cholecalciferol (VITAMIN D3) 50 MCG (2000 UT) capsule Take 1 capsule (2,000 Units total) by mouth daily. 100 capsule 3   Galcanezumab -gnlm (EMGALITY ) 120 MG/ML SOAJ INJECT 120 MG INTO THE SKIN EVERY 28 (TWENTY-EIGHT) DAYS. 1 mL 3   ibuprofen  (ADVIL ) 400 MG tablet Take 1 tablet (400 mg total) by mouth every 6 (six) hours as needed. 20 tablet 0   meloxicam (MOBIC) 15 MG tablet Take 1 tablet daily for 2 weeks.  If still in pain after 2 weeks, take 1 tablet daily for an additional 1 week. 30 tablet 0   montelukast  (SINGULAIR ) 10 MG tablet Take 10 mg by mouth daily.     ondansetron  (ZOFRAN ) 4 MG tablet Take 1 tablet (4 mg total) by mouth every 8 (eight) hours as needed for nausea or vomiting. 20 tablet 1   propranolol  (INDERAL ) 20 MG tablet TAKE 1 TABLET BY MOUTH TWICE A DAY 180 tablet 3   SUMAtriptan  (IMITREX ) 100 MG tablet TAKE 1 TAB BY MOUTH DAILY AS NEEDED FOR MIGRAINE MAY REPEAT IN 2HOURS IF HEADACHE PERSISTS OR RECURS 12 tablet 5   desvenlafaxine  (PRISTIQ ) 100 MG 24 hr tablet TAKE 1 TABLET BY MOUTH ONCE DAILY 90 tablet 3   amphetamine -dextroamphetamine  (ADDERALL XR) 10 MG 24 hr capsule Take Adderall XR 20 mg in am, 10 mg at lunch. (Patient not taking: Reported on 11/20/2023) 90 capsule 0   amphetamine -dextroamphetamine  (ADDERALL XR) 10 MG 24 hr capsule Take Adderall XR 20 mg in am, 10 mg at lunch. (Patient not taking: Reported on 11/20/2023) 90 capsule 0   No facility-administered medications prior to visit.    ROS: Review of Systems   Constitutional:  Positive for unexpected weight change. Negative for activity change, appetite change, chills and fatigue.  HENT:  Negative for congestion, mouth sores and sinus pressure.   Eyes:  Negative for visual disturbance.  Respiratory:  Negative for cough and chest tightness.   Gastrointestinal:  Positive for nausea and vomiting. Negative for abdominal distention and abdominal pain.  Genitourinary:  Negative for difficulty urinating, frequency and vaginal pain.  Musculoskeletal:  Negative for back pain and gait problem.  Skin:  Negative for pallor and rash.  Neurological:  Negative for dizziness, tremors, weakness, numbness and headaches.  Psychiatric/Behavioral:  Negative for confusion, sleep disturbance and suicidal ideas.     Objective:  BP (!) 138/96   Pulse 60   Temp 98.2 F (36.8 C)   Ht 5' 2 (1.575 m)   Wt 121 lb 12.8 oz (55.2 kg)   SpO2 97%   BMI 22.28 kg/m   BP Readings from Last 3 Encounters:  11/20/23 (!) 138/96  08/20/23 (!) 160/90  06/25/23 (!) 150/82    Wt Readings from Last 3 Encounters:  11/20/23 121 lb 12.8 oz (55.2 kg)  11/10/23 125 lb (56.7 kg)  08/20/23 125 lb 8 oz (56.9 kg)    Physical Exam Constitutional:      General: She is not in acute distress.    Appearance: Normal appearance. She is well-developed. She is  not toxic-appearing.  HENT:     Head: Normocephalic.     Right Ear: External ear normal.     Left Ear: External ear normal.     Nose: Nose normal.  Eyes:     General:        Right eye: No discharge.        Left eye: No discharge.     Conjunctiva/sclera: Conjunctivae normal.     Pupils: Pupils are equal, round, and reactive to light.  Neck:     Thyroid : No thyromegaly.     Vascular: No JVD.     Trachea: No tracheal deviation.  Cardiovascular:     Rate and Rhythm: Normal rate and regular rhythm.     Heart sounds: Normal heart sounds.  Pulmonary:     Effort: No respiratory distress.     Breath sounds: No stridor. No  wheezing.  Abdominal:     General: Bowel sounds are normal. There is no distension.     Palpations: Abdomen is soft. There is no mass.     Tenderness: There is no abdominal tenderness. There is no guarding or rebound.  Musculoskeletal:        General: No tenderness.     Cervical back: Normal range of motion and neck supple. No rigidity.     Right lower leg: No edema.     Left lower leg: No edema.  Lymphadenopathy:     Cervical: No cervical adenopathy.  Skin:    Findings: No erythema or rash.  Neurological:     Mental Status: She is oriented to person, place, and time.     Cranial Nerves: No cranial nerve deficit.     Motor: No abnormal muscle tone.     Coordination: Coordination normal.     Deep Tendon Reflexes: Reflexes normal.  Psychiatric:        Behavior: Behavior normal.        Thought Content: Thought content normal.        Judgment: Judgment normal.   Large ventral hernia to the left 8x8 cm  Lab Results  Component Value Date   WBC 10.3 05/19/2023   HGB 16.2 (H) 05/19/2023   HCT 47.9 (H) 05/19/2023   PLT 204.0 05/19/2023   GLUCOSE 130 (H) 05/19/2023   CHOL 203 (H) 02/20/2023   TRIG 263.0 (H) 02/20/2023   HDL 38.20 (L) 02/20/2023   LDLCALC 112 (H) 02/20/2023   ALT 13 05/19/2023   AST 17 05/19/2023   NA 140 05/19/2023   K 3.6 05/19/2023   CL 102 05/19/2023   CREATININE 0.72 05/19/2023   BUN 11 05/19/2023   CO2 29 05/19/2023   TSH 1.24 02/20/2023   PSA 0.00 (L) 02/20/2023   INR 1.02 09/28/2014    CT ABDOMEN PELVIS W CONTRAST Result Date: 09/18/2022 CLINICAL DATA:  History of ventral hernia. Soreness, tenderness on side EXAM: CT ABDOMEN AND PELVIS WITH CONTRAST TECHNIQUE: Multidetector CT imaging of the abdomen and pelvis was performed using the standard protocol following bolus administration of intravenous contrast. RADIATION DOSE REDUCTION: This exam was performed according to the departmental dose-optimization program which includes automated exposure  control, adjustment of the mA and/or kV according to patient size and/or use of iterative reconstruction technique. CONTRAST:  100mL ISOVUE -300 IOPAMIDOL  (ISOVUE -300) INJECTION 61% COMPARISON:  09/28/2014 FINDINGS: Lower chest: No acute abnormality Hepatobiliary: No focal liver abnormality is seen. Status post cholecystectomy. No biliary dilatation. Pancreas: No focal abnormality or ductal dilatation. Spleen: No focal abnormality.  Normal size. Adrenals/Urinary  Tract: No adrenal abnormality. No focal renal abnormality. No stones or hydronephrosis. Urinary bladder is unremarkable. Stomach/Bowel: Stomach, large and small bowel grossly unremarkable. Vascular/Lymphatic: Scattered aortic atherosclerosis. No evidence of aneurysm or adenopathy. Reproductive: Prior hysterectomy.  No adnexal masses. Other: No free fluid or free air. Small supraumbilical ventral hernia containing fat, stable. Musculoskeletal: No acute bony abnormality. IMPRESSION: Small supraumbilical ventral hernia containing fat, stable since prior study. No new or additional abdominal hernia. No acute findings in the abdomen or pelvis. Electronically Signed   By: Franky Crease M.D.   On: 09/18/2022 10:07    Assessment & Plan:   Problem List Items Addressed This Visit     Abdominal pain - Primary   ?gastritis On Meloxicam for LBP Start Protonix  EGD/colonoscopy pending Large ventral hernia to the left      Relevant Orders   Lipase   Comprehensive metabolic panel with GFR   CBC with Differential/Platelet   Anxiety disorder   Off Pristiq , off ADD meds      Nausea & vomiting   ?gastritis On Meloxicam for LBP Start Protonix  Use Zofran  prn EGD/colonoscopy pending Large ventral hernia to the left      Relevant Orders   Lipase   Comprehensive metabolic panel with GFR   CBC with Differential/Platelet   Sweating profusely   Chronic      Ventral hernia   Large ventral hernia to the left 8x8         Meds ordered this  encounter  Medications   pantoprazole  (PROTONIX ) 40 MG tablet    Sig: Take 1 tablet (40 mg total) by mouth daily.    Dispense:  90 tablet    Refill:  3      Follow-up: Return in about 3 months (around 02/20/2024) for a follow-up visit.  Marolyn Noel, MD

## 2023-11-20 NOTE — Assessment & Plan Note (Addendum)
?  gastritis On Meloxicam for LBP Start Protonix  Use Zofran  prn EGD/colonoscopy pending Large ventral hernia to the left

## 2023-11-20 NOTE — Assessment & Plan Note (Addendum)
?  gastritis On Meloxicam for LBP Start Protonix  EGD/colonoscopy pending Large ventral hernia to the left

## 2023-11-20 NOTE — Assessment & Plan Note (Signed)
 Chronic

## 2023-11-20 NOTE — Assessment & Plan Note (Signed)
 Large ventral hernia to the left 8x8

## 2023-11-24 ENCOUNTER — Ambulatory Visit: Payer: Self-pay | Admitting: Internal Medicine

## 2023-12-02 ENCOUNTER — Other Ambulatory Visit: Payer: Self-pay | Admitting: Internal Medicine

## 2023-12-04 NOTE — Progress Notes (Deleted)
    Ben Jackson D.[facility] [address] [place] [address] Phone: [phone]   Assessment and Plan:     ***    Pertinent previous records reviewed include ***   Follow Up: ***     Subjective:   I, Shelly Green, am serving as a neurosurgeon for Doctor Morene Mace   Chief Complaint: low back pain    HPI:    11/10/23 Patient is a 63 year old female with low back pain. Patient states pain started 3 weeks ago. No MOI. Pain is intermittent that really flared last week. Was seen in UC last week. Tylenol  and ibu for the pain. Started meloxicam today. Was also given a muscle relaxer. Doesn't know if it helps. Pain radiates to the hip and higher up. Decreased ROM.   12/08/2023 Patient states   Relevant Historical Information: COPD, hypertension, GERD, IBS, osteoporosis, history of breast cancer  Additional pertinent review of systems negative.   Current Outpatient Medications:    Albuterol-Budesonide (AIRSUPRA ) 90-80 MCG/ACT AERO, Inhale 2 Inhalations into the lungs every 4 (four) hours as needed., Disp: 10 g, Rfl: 11   Cholecalciferol (VITAMIN D3) 50 MCG (2000 UT) capsule, Take 1 capsule (2,000 Units total) by mouth daily., Disp: 100 capsule, Rfl: 3   Galcanezumab -gnlm (EMGALITY ) 120 MG/ML SOAJ, INJECT 120 MG INTO THE SKIN EVERY 28 (TWENTY-EIGHT) DAYS., Disp: 1 mL, Rfl: 3   ibuprofen  (ADVIL ) 400 MG tablet, Take 1 tablet (400 mg total) by mouth every 6 (six) hours as needed., Disp: 20 tablet, Rfl: 0   meloxicam (MOBIC) 15 MG tablet, Take 1 tablet daily for 2 weeks.  If still in pain after 2 weeks, take 1 tablet daily for an additional 1 week., Disp: 30 tablet, Rfl: 0   montelukast  (SINGULAIR ) 10 MG tablet, TAKE 1 TABLET BY MOUTH EVERY DAY, Disp: 90 tablet, Rfl: 3   ondansetron  (ZOFRAN ) 4 MG tablet, Take 1 tablet (4 mg total) by mouth every 8 (eight) hours as needed for nausea or vomiting., Disp: 20 tablet, Rfl: 1   pantoprazole   (PROTONIX ) 40 MG tablet, Take 1 tablet (40 mg total) by mouth daily., Disp: 90 tablet, Rfl: 3   propranolol  (INDERAL ) 20 MG tablet, TAKE 1 TABLET BY MOUTH TWICE A DAY, Disp: 180 tablet, Rfl: 3   SUMAtriptan  (IMITREX ) 100 MG tablet, TAKE 1 TAB BY MOUTH DAILY AS NEEDED FOR MIGRAINE MAY REPEAT IN 2HOURS IF HEADACHE PERSISTS OR RECURS, Disp: 12 tablet, Rfl: 5   Objective:     There were no vitals filed for this visit.    There is no height or weight on file to calculate BMI.    Physical Exam:    ***   Electronically signed by:  Odis Mace D.[facility] 4:27 PM 12/04/23

## 2023-12-06 ENCOUNTER — Other Ambulatory Visit: Payer: Self-pay | Admitting: Neurology

## 2023-12-08 ENCOUNTER — Ambulatory Visit: Admitting: Sports Medicine

## 2023-12-12 ENCOUNTER — Encounter: Payer: Self-pay | Admitting: Sports Medicine

## 2024-01-06 ENCOUNTER — Telehealth: Payer: Self-pay | Admitting: Pharmacy Technician

## 2024-01-06 NOTE — Telephone Encounter (Signed)
 Pharmacy Patient Advocate Encounter   Received notification from Patient Pharmacy that prior authorization for EMGALITY  120MG  is required/requested.   Insurance verification completed.   The patient is insured through [employer].   Per test claim: PA required; PA submitted to above mentioned insurance via [facility]/confirmation #/EOC [id number] P Status is pending

## 2024-02-23 ENCOUNTER — Ambulatory Visit: Admitting: Internal Medicine

## 2024-03-19 ENCOUNTER — Ambulatory Visit: Admitting: Internal Medicine

## 2024-03-23 ENCOUNTER — Ambulatory Visit: Admitting: Internal Medicine

## 2024-06-24 ENCOUNTER — Ambulatory Visit: Admitting: Neurology
